# Patient Record
Sex: Female | Born: 1980 | Race: Black or African American | Hispanic: No | Marital: Married | Smoking: Never smoker
Health system: Southern US, Community
[De-identification: ages and names within clinical notes are randomized; demographics above are authoritative.]

## PROBLEM LIST (undated history)

## (undated) DIAGNOSIS — H538 Other visual disturbances: Secondary | ICD-10-CM

## (undated) DIAGNOSIS — N390 Urinary tract infection, site not specified: Secondary | ICD-10-CM

## (undated) DIAGNOSIS — G43909 Migraine, unspecified, not intractable, without status migrainosus: Secondary | ICD-10-CM

## (undated) HISTORY — DX: Other visual disturbances: H53.8

---

## 2004-09-18 HISTORY — PX: TUBAL LIGATION: SHX77

## 2005-09-18 HISTORY — PX: CHOLECYSTECTOMY: SHX55

## 2013-02-14 ENCOUNTER — Encounter (HOSPITAL_COMMUNITY): Payer: Self-pay | Admitting: Emergency Medicine

## 2013-02-14 ENCOUNTER — Emergency Department (HOSPITAL_COMMUNITY)
Admission: EM | Admit: 2013-02-14 | Discharge: 2013-02-14 | Disposition: A | Discharge status: Home / Self Care | Payer: Medicaid Other | Attending: Emergency Medicine | Admitting: Emergency Medicine

## 2013-02-14 DIAGNOSIS — R6889 Other general symptoms and signs: Secondary | ICD-10-CM | POA: Insufficient documentation

## 2013-02-14 DIAGNOSIS — H579 Unspecified disorder of eye and adnexa: Secondary | ICD-10-CM | POA: Insufficient documentation

## 2013-02-14 DIAGNOSIS — Z8679 Personal history of other diseases of the circulatory system: Secondary | ICD-10-CM | POA: Insufficient documentation

## 2013-02-14 DIAGNOSIS — J302 Other seasonal allergic rhinitis: Secondary | ICD-10-CM

## 2013-02-14 DIAGNOSIS — H5789 Other specified disorders of eye and adnexa: Secondary | ICD-10-CM | POA: Insufficient documentation

## 2013-02-14 DIAGNOSIS — J309 Allergic rhinitis, unspecified: Secondary | ICD-10-CM | POA: Insufficient documentation

## 2013-02-14 HISTORY — DX: Migraine, unspecified, not intractable, without status migrainosus: G43.909

## 2013-02-14 MED ORDER — LORATADINE 10 MG PO TABS: 10.0000 mg | ORAL_TABLET | Freq: Every day | ORAL | Status: DC

## 2013-02-14 NOTE — ED Provider Notes (Signed)
History  This chart was scribed for Arnoldo Hooker, PA-C working with Dione Booze, MD by Ardelia Mems, ED Scribe. This patient was seen in room TR11C/TR11C and the patient's care was started at 9:44 PM.   CSN: [id number]  Arrival date & time 02/14/13  2003     Chief Complaint  Patient presents with  . Sinus Problem     The history is provided by the patient. No language interpreter was used.    HPI Comments: Brittney Watkins is a 32 y.o. female who presents to the Emergency Department complaining of constant, moderate sinus pain of about 10 days duration. Patient also reports associated nasal congestion, rhinorrhea, itchy watery eyes and some sneezing. Pt denies fever. Pt has tried taken Benadryl with only mild relief.     Past Medical History  Diagnosis Date  . Migraine     Past Surgical History  Procedure Laterality Date  . Cholecystectomy    . Tubal ligation      No family history on file.  History  Substance Use Topics  . Smoking status: Never Smoker   . Smokeless tobacco: Not on file  . Alcohol Use: No    OB History   Grav Para Term Preterm Abortions TAB SAB Ect Mult Living                  Review of Systems  Constitutional: Negative for fever and chills.  HENT: Positive for congestion, rhinorrhea and sneezing.     Allergies  Nyquil multi-symptom  Home Medications   Current Outpatient Rx  Name  Route  Sig  Dispense  Refill  . DiphenhydrAMINE HCl (BENADRYL PO)   Oral   Take 1 tablet by mouth 2 (two) times daily as needed (seasonal allergies).           Triage Vitals: BP 116/79  Pulse 90  Temp(Src) 98.4 F (36.9 C) (Oral)  Resp 16  SpO2 97%  LMP 01/31/2013  Physical Exam  Nursing note and vitals reviewed. Constitutional: She is oriented to person, place, and time. She appears well-developed and well-nourished.  HENT:  Head: Normocephalic and atraumatic.  Nasal mucosa swollen and boggy. Oropharynx benign. TMs clear bilaterally. Sinus  tenderness frontal and maxillary sinuses bilaterally  Eyes: EOM are normal. Pupils are equal, round, and reactive to light.  Neck: Normal range of motion. No tracheal deviation present.  Cardiovascular: Normal rate.   Pulmonary/Chest: Effort normal. No respiratory distress.  Abdominal: Soft. There is no tenderness.  Musculoskeletal: Normal range of motion. She exhibits no tenderness.  Neurological: She is alert and oriented to person, place, and time.  Skin: Skin is warm. No rash noted.  Psychiatric: She has a normal mood and affect.    ED Course  Procedures (including critical care time)  DIAGNOSTIC STUDIES: Oxygen Saturation is 97% on RA, normal by my interpretation.    COORDINATION OF CARE: 9:52 PM- Pt advised of plan for treatment and pt agrees.     Labs Reviewed - No data to display No results found.   No diagnosis found.  1. Seasonal allergies  MDM  Uncomplicated allergic rhinitis         I personally performed the services described in this documentation, which was scribed in my presence. The recorded information has been reviewed and is accurate.     Arnoldo Hooker, PA-C 02/14/13 2219

## 2013-02-14 NOTE — ED Notes (Signed)
Pt reports sinus drainage x 1.5 weeks. States she has taken OTC meds with no relief. States sinus pressure and headache are 7/10. Pt denies fever, sore throat, cough.

## 2013-02-14 NOTE — ED Notes (Addendum)
C/o runny nose, congestion, and sinus pressure x 1 week.  Using Benadryl, Advil PM, and allergy/sinus medication with some relief of symptoms.  Denies cough

## 2013-02-15 NOTE — ED Provider Notes (Signed)
Medical screening examination/treatment/procedure(s) were performed by non-physician practitioner and as supervising physician I was immediately available for consultation/collaboration.  Ramina Hulet, MD 02/15/13 0035 

## 2013-04-07 ENCOUNTER — Encounter (HOSPITAL_COMMUNITY): Payer: Self-pay | Admitting: *Deleted

## 2013-04-07 ENCOUNTER — Emergency Department (HOSPITAL_COMMUNITY)
Admission: EM | Admit: 2013-04-07 | Discharge: 2013-04-07 | Disposition: A | Discharge status: Home / Self Care | Payer: Medicaid Other | Source: Home / Self Care

## 2013-04-07 DIAGNOSIS — R519 Headache, unspecified: Secondary | ICD-10-CM

## 2013-04-07 DIAGNOSIS — J320 Chronic maxillary sinusitis: Secondary | ICD-10-CM

## 2013-04-07 DIAGNOSIS — R51 Headache: Secondary | ICD-10-CM

## 2013-04-07 MED ORDER — SUMATRIPTAN SUCCINATE 25 MG PO TABS: ORAL_TABLET | ORAL | Status: DC

## 2013-04-07 NOTE — ED Provider Notes (Signed)
History    CSN: [id number] Arrival date & time 04/07/13  1306  None    Chief Complaint  Patient presents with  . Migraine   (Consider location/radiation/quality/duration/timing/severity/associated sxs/prior Treatment) HPI Comments: 32 year old female was at work today and developed a sinus headache. The pain is located in bilateral maxillary sinuses. Pain radiated to the right temple and periorbital area. She been to the emergency department to 3 weeks ago and was given Fioricet for pain. Has a history of migraine headaches since her early 51s and the frequency is highly variable. She was placed on Imitrex tablets of 2 years ago but she is out of his medications. [place]. She has no current physician but she does have Medicaid. She has a slight that Dr. Lockie Mola card and she is waiting until next month to have that physician changed. She states his headache is a minor when her pain is minimal she had temporary blurriness of vision but no photophobia or diplopia. She had transient nausea without vomiting that too has abated. Denies focal paresthesias or weakness.  Past Medical History  Diagnosis Date  . Migraine    Past Surgical History  Procedure Laterality Date  . Cholecystectomy    . Tubal ligation     History reviewed. No pertinent family history. History  Substance Use Topics  . Smoking status: Never Smoker   . Smokeless tobacco: Not on file  . Alcohol Use: No   OB History   Grav Para Term Preterm Abortions TAB SAB Ect Mult Living                 Review of Systems  Constitutional: Positive for activity change. Negative for fever and fatigue.  HENT: Positive for congestion and sinus pressure. Negative for ear pain, sore throat, facial swelling, neck pain and postnasal drip.   Eyes: Negative for photophobia and visual disturbance.  Respiratory: Negative.   Cardiovascular: Negative.   Gastrointestinal: Positive for nausea.   Genitourinary: Negative.   Neurological: Positive for headaches. Negative for dizziness, tremors, seizures, syncope, facial asymmetry, weakness, light-headedness and numbness.    Allergies  Nyquil multi-symptom  Home Medications   Current Outpatient Rx  Name  Route  Sig  Dispense  Refill  . Butalbital-APAP-Caffeine (FIORICET PO)   Oral   Take by mouth.         . Topiramate (TOPAMAX PO)   Oral   Take by mouth.         . DiphenhydrAMINE HCl (BENADRYL PO)   Oral   Take 1 tablet by mouth 2 (two) times daily as needed (seasonal allergies).         . loratadine (CLARITIN) 10 MG tablet   Oral   Take 1 tablet (10 mg total) by mouth daily.   14 tablet   0   . SUMAtriptan (IMITREX) 25 MG tablet      One tablet po at onset of headache and , may repeat x 1 in 2 h prn.   10 tablet   0    BP 115/67  Pulse 80  Temp(Src) 98.5 F (36.9 C)  Resp 17  SpO2 100%  LMP 04/03/2013 Physical Exam  Nursing note and vitals reviewed. Constitutional: She is oriented to person, place, and time. She appears well-developed and well-nourished. No distress.  HENT:  Head: Normocephalic and atraumatic.  Mouth/Throat: Oropharynx is clear and moist. No oropharyngeal exudate.  Eyes: Conjunctivae and EOM are normal. Pupils are  equal, round, and reactive to light.  Neck: Normal range of motion. Neck supple.  Cardiovascular: Normal rate and normal heart sounds.   Pulmonary/Chest: Effort normal and breath sounds normal. No respiratory distress. She has no wheezes. She has no rales.  Abdominal: Soft. There is no tenderness.  Musculoskeletal: Normal range of motion.  Lymphadenopathy:    She has no cervical adenopathy.  Neurological: She is alert and oriented to person, place, and time. She has normal strength. She displays no tremor. No cranial nerve deficit or sensory deficit. She exhibits normal muscle tone. Coordination normal. GCS eye subscore is 4. GCS verbal subscore is 5. GCS motor subscore  is 6.  Skin: Skin is warm and dry.  Psychiatric: She has a normal mood and affect.    ED Course  Procedures (including critical care time) Labs Reviewed - No data to display No results found. 1. Sinusitis, maxillary, chronic   2. Headache around the eyes     MDM  Imitrex 25mg  tablets, one at onset of headache and may repeat in 2 hours x1 when necessary. Followup with your primary care doctor is on her Medicaid card. Continue other medications as previously prescribed. Patient is discharged in a stable condition.   Hayden Rasmussen, NP 04/07/13 1420  Hayden Rasmussen, NP 04/07/13 2131

## 2013-04-07 NOTE — ED Notes (Signed)
Pt  Has  History  Of  Migraines     She  Is  Out of  Her imitrex  Which  She  Says  Typically  releives  The  Symptoms     She  Is  Requesting an rx  For the  imitrex    She  Is  Sitting upright on the  Exam table  Speaking in  Complete  sentances  And  Appears  In no  Severe  Distress

## 2013-04-10 NOTE — ED Provider Notes (Signed)
Medical screening examination/treatment/procedure(s) were performed by resident physician or non-physician practitioner and as supervising physician I was immediately available for consultation/collaboration.   Barkley Bruns MD.   Linna Hoff, MD 04/10/13 1745

## 2013-06-10 ENCOUNTER — Emergency Department (INDEPENDENT_AMBULATORY_CARE_PROVIDER_SITE_OTHER)
Admission: EM | Admit: 2013-06-10 | Discharge: 2013-06-10 | Disposition: A | Discharge status: Home / Self Care | Payer: Medicaid Other | Source: Home / Self Care | Attending: Emergency Medicine | Admitting: Emergency Medicine

## 2013-06-10 DIAGNOSIS — B009 Herpesviral infection, unspecified: Secondary | ICD-10-CM

## 2013-06-10 DIAGNOSIS — R300 Dysuria: Secondary | ICD-10-CM

## 2013-06-10 LAB — POCT URINALYSIS DIP (DEVICE)
Bilirubin Urine: NEGATIVE
Glucose, UA: NEGATIVE mg/dL
Leukocytes, UA: NEGATIVE
Nitrite: NEGATIVE
pH: 6.5 (ref 5.0–8.0)

## 2013-06-10 MED ORDER — FAMCICLOVIR 500 MG PO TABS: 500.0000 mg | ORAL_TABLET | Freq: Three times a day (TID) | ORAL | Status: DC

## 2013-06-10 MED ORDER — HYDROCODONE-ACETAMINOPHEN 5-325 MG PO TABS: 2.0000 | ORAL_TABLET | ORAL | Status: DC | PRN

## 2013-06-10 NOTE — ED Notes (Signed)
Pt c/o lower abd pain onset 2 weeks Sxs include: urinary freq/urgency Denies: dysuria, f/v/n/d Also c/o herpes flare up onset yest Alert w/no signs of acute distress.

## 2013-06-10 NOTE — ED Provider Notes (Signed)
Medical screening examination/treatment/procedure(s) were performed by non-physician practitioner and as supervising physician I was immediately available for consultation/collaboration.  Xian Alves, M.D.  Mouna Yager C Yuki Brunsman, MD 06/10/13 2100 

## 2013-06-10 NOTE — ED Provider Notes (Signed)
CSN: [id number]     Arrival date & time 06/10/13  1754 History   First MD Initiated Contact with Patient 06/10/13 1917     Chief Complaint  Patient presents with  . Abdominal Pain   (Consider location/radiation/quality/duration/timing/severity/associated sxs/prior Treatment) Patient is a 32 y.o. female presenting with abdominal pain. The history is provided by the patient. No language interpreter was used.  Abdominal Pain This is a new problem. Episode onset: 2 weeks. The problem occurs constantly. Associated symptoms include abdominal pain. Nothing aggravates the symptoms. Nothing relieves the symptoms. She has tried nothing for the symptoms.  Pt worried taht hse has a uti.  Pt reports lower abdominal soreness for 2 weeks,   Pt reports current herpes outbreak.   Pt has had herpes x 7 years.  No current std risk.    Past Medical History  Diagnosis Date  . Migraine    Past Surgical History  Procedure Laterality Date  . Cholecystectomy    . Tubal ligation     No family history on file. History  Substance Use Topics  . Smoking status: Never Smoker   . Smokeless tobacco: Not on file  . Alcohol Use: No   OB History   Grav Para Term Preterm Abortions TAB SAB Ect Mult Living                 Review of Systems  Gastrointestinal: Positive for abdominal pain.  All other systems reviewed and are negative.    Allergies  Nyquil multi-symptom  Home Medications   Current Outpatient Rx  Name  Route  Sig  Dispense  Refill  . SUMAtriptan (IMITREX) 25 MG tablet      One tablet po at onset of headache and , may repeat x 1 in 2 h prn.   10 tablet   0   . Topiramate (TOPAMAX PO)   Oral   Take by mouth.         Marland Kitchen Butalbital-APAP-Caffeine (FIORICET PO)   Oral   Take by mouth.         . DiphenhydrAMINE HCl (BENADRYL PO)   Oral   Take 1 tablet by mouth 2 (two) times daily as needed (seasonal allergies).         . famciclovir (FAMVIR) 500 MG tablet   Oral   Take 1 tablet  (500 mg total) by mouth 3 (three) times daily.   21 tablet   2   . HYDROcodone-acetaminophen (NORCO/VICODIN) 5-325 MG per tablet   Oral   Take 2 tablets by mouth every 4 (four) hours as needed for pain.   10 tablet   0   . loratadine (CLARITIN) 10 MG tablet   Oral   Take 1 tablet (10 mg total) by mouth daily.   14 tablet   0    BP 119/72  Pulse 68  Temp(Src) 97.9 F (36.6 C) (Oral)  Resp 16  SpO2 100% Physical Exam  Nursing note and vitals reviewed. Constitutional: She is oriented to person, place, and time. She appears well-developed and well-nourished.  HENT:  Head: Normocephalic.  Eyes: Pupils are equal, round, and reactive to light.  Neck: Normal range of motion.  Cardiovascular: Normal rate.   Pulmonary/Chest: Effort normal.  Abdominal: Soft. There is no tenderness. There is no rebound.  Genitourinary: Vagina normal and uterus normal. No vaginal discharge found.  Musculoskeletal: Normal range of motion.  Neurological: She is alert and oriented to person, place, and time. She has normal reflexes.  Skin:  Skin is warm.  Psychiatric: She has a normal mood and affect.    ED Course  Procedures (including critical care time) Labs Review Labs Reviewed  POCT URINALYSIS DIP (DEVICE)  POCT PREGNANCY, URINE   Imaging Review No results found.  MDM   1. Dysuria   2. Herpes    Rx for valtrex and hydrocodone   KELVIN BURPEE, PA-C 06/10/13 1936  Brittney Watkins [place], [place] 06/10/13 2022

## 2013-06-15 ENCOUNTER — Emergency Department (HOSPITAL_COMMUNITY)
Admission: EM | Admit: 2013-06-15 | Discharge: 2013-06-15 | Disposition: A | Discharge status: Home / Self Care | Payer: Medicaid Other | Source: Home / Self Care | Attending: Family Medicine | Admitting: Family Medicine

## 2013-06-15 ENCOUNTER — Encounter (HOSPITAL_COMMUNITY): Payer: Self-pay | Admitting: *Deleted

## 2013-06-15 DIAGNOSIS — R51 Headache: Secondary | ICD-10-CM

## 2013-06-15 DIAGNOSIS — G43909 Migraine, unspecified, not intractable, without status migrainosus: Secondary | ICD-10-CM

## 2013-06-15 DIAGNOSIS — R519 Headache, unspecified: Secondary | ICD-10-CM

## 2013-06-15 MED ORDER — METHYLPREDNISOLONE 4 MG PO KIT: PACK | ORAL | Status: DC

## 2013-06-15 MED ORDER — SUMATRIPTAN SUCCINATE 25 MG PO TABS: ORAL_TABLET | ORAL | Status: DC

## 2013-06-15 MED ORDER — BUTALBITAL-APAP-CAFFEINE 50-325-40 MG PO TABS: 2.0000 | ORAL_TABLET | Freq: Four times a day (QID) | ORAL | Status: DC | PRN

## 2013-06-15 MED ORDER — CETIRIZINE-PSEUDOEPHEDRINE ER 5-120 MG PO TB12: 1.0000 | ORAL_TABLET | Freq: Two times a day (BID) | ORAL | Status: DC | PRN

## 2013-06-15 NOTE — ED Provider Notes (Signed)
CSN: [id number]     Arrival date & time 06/15/13  0912 History   First MD Initiated Contact with Patient 06/15/13 [phone]     Chief Complaint  Patient presents with  . Migraine   (Consider location/radiation/quality/duration/timing/severity/associated sxs/prior Treatment) HPI Comments: 32 year old female presents complaining of migraine headache. She has a long history of migraines and this is exactly the same as all previous migraines she has ever had. Migraines located across the sinuses and in the right side of her head. This has been going on since yesterday. She can normally control this with Fioricet and with sumatriptan but she ran out of both of these. She does have a primary care appointment set up for next week. She admits to one episode of vomiting but this was after she tried to take some pain medicine that she got from here last week for her abdominal pain, there's been no other nausea after this single episode of vomiting and she believes this is from the pain medicine. In addition to migraine, she has been having sinus pressure. She has tried to treat this at home with Advil and with a Neti pot but this has not been helpful. She has associated nasal congestion and sinus tenderness around her nose.   Past Medical History  Diagnosis Date  . Migraine    Past Surgical History  Procedure Laterality Date  . Cholecystectomy    . Tubal ligation     No family history on file. History  Substance Use Topics  . Smoking status: Never Smoker   . Smokeless tobacco: Not on file  . Alcohol Use: No   OB History   Grav Para Term Preterm Abortions TAB SAB Ect Mult Living                 Review of Systems  Constitutional: Negative for fever and chills.  HENT: Positive for congestion and sinus pressure. Negative for ear pain and neck pain.        Phonophobia  Eyes: Positive for photophobia. Negative for visual disturbance.  Respiratory: Negative for cough and shortness of breath.    Cardiovascular: Negative for chest pain, palpitations and leg swelling.  Gastrointestinal: Positive for vomiting ( resolved). Negative for nausea and abdominal pain.  Endocrine: Negative for polydipsia and polyuria.  Genitourinary: Negative for dysuria, urgency and frequency.  Musculoskeletal: Negative for myalgias and arthralgias.  Skin: Negative for rash.  Neurological: Positive for headaches. Negative for dizziness, weakness and light-headedness.    Allergies  Nyquil multi-symptom and Shellfish allergy  Home Medications   Current Outpatient Rx  Name  Route  Sig  Dispense  Refill  . famciclovir (FAMVIR) 500 MG tablet   Oral   Take 1 tablet (500 mg total) by mouth 3 (three) times daily.   21 tablet   2   . butalbital-acetaminophen-caffeine (FIORICET) 50-325-40 MG per tablet   Oral   Take 2 tablets by mouth every 6 (six) hours as needed.   20 tablet   0   . cetirizine-pseudoephedrine (ZYRTEC-D) 5-120 MG per tablet   Oral   Take 1 tablet by mouth 2 (two) times daily as needed for allergies.   30 tablet   0   . DiphenhydrAMINE HCl (BENADRYL PO)   Oral   Take 1 tablet by mouth 2 (two) times daily as needed (seasonal allergies).         Marland Kitchen HYDROcodone-acetaminophen (NORCO/VICODIN) 5-325 MG per tablet   Oral   Take 2 tablets by mouth every 4 (  four) hours as needed for pain.   10 tablet   0   . loratadine (CLARITIN) 10 MG tablet   Oral   Take 1 tablet (10 mg total) by mouth daily.   14 tablet   0   . methylPREDNISolone (MEDROL DOSEPAK) 4 MG tablet      follow package directions   21 tablet   0     Dispense as written.   . SUMAtriptan (IMITREX) 25 MG tablet      One tablet po at onset of headache and , may repeat x 1 in 2 h prn.   10 tablet   0   . Topiramate (TOPAMAX PO)   Oral   Take by mouth.          BP 124/79  Pulse 71  Temp(Src) 99 F (37.2 C) (Oral)  Resp 18  SpO2 100%  LMP 05/29/2013 Physical Exam  Nursing note and vitals  reviewed. Constitutional: She is oriented to person, place, and time. Vital signs are normal. She appears well-developed and well-nourished. No distress.  HENT:  Head: Normocephalic and atraumatic.  Nose: Right sinus exhibits maxillary sinus tenderness. Right sinus exhibits no frontal sinus tenderness. Left sinus exhibits maxillary sinus tenderness. Left sinus exhibits no frontal sinus tenderness.  Ethmoid sinus tenderness  Cardiovascular: Normal rate, regular rhythm and normal heart sounds.   Pulmonary/Chest: Effort normal and breath sounds normal. No respiratory distress.  Neurological: She is alert and oriented to person, place, and time. She has normal strength. No cranial nerve deficit or sensory deficit. Coordination normal. GCS eye subscore is 4. GCS verbal subscore is 5. GCS motor subscore is 6.  Skin: Skin is warm and dry. No rash noted. She is not diaphoretic.  Psychiatric: She has a normal mood and affect. Judgment normal.    ED Course  Procedures (including critical care time) Labs Review Labs Reviewed - No data to display Imaging Review No results found.  MDM   1. Migraine headache   2. Sinus headache    Given the option of treating here to try to break migraine versus prescribing her the sumatriptan and Fioricet and discharge her, she elects to go with the latter. She will return to migraine does not abate. We'll also prescribe Zyrtec-D and Medrol Dosepak to help with the sinus pressure   Meds ordered this encounter  Medications  . SUMAtriptan (IMITREX) 25 MG tablet    Sig: One tablet po at onset of headache and , may repeat x 1 in 2 h prn.    Dispense:  10 tablet    Refill:  0    Order Specific Question:  Supervising Provider    Answer:  Linna Hoff [phone]  . butalbital-acetaminophen-caffeine (FIORICET) 50-325-40 MG per tablet    Sig: Take 2 tablets by mouth every 6 (six) hours as needed.    Dispense:  20 tablet    Refill:  0    Order Specific Question:   Supervising Provider    Answer:  Lorenz Coaster, DAVID C [id number]  . methylPREDNISolone (MEDROL DOSEPAK) 4 MG tablet    Sig: follow package directions    Dispense:  21 tablet    Refill:  0    Order Specific Question:  Supervising Provider    Answer:  Lorenz Coaster, DAVID C [id number]  . cetirizine-pseudoephedrine (ZYRTEC-D) 5-120 MG per tablet    Sig: Take 1 tablet by mouth 2 (two) times daily as needed for allergies.    Dispense:  30 tablet  Refill:  0    Order Specific Question:  Supervising Provider    Answer:  Lorenz Coaster, DAVID C [[id number]]       Graylon Good, PA-C 06/15/13 1024

## 2013-06-15 NOTE — ED Provider Notes (Signed)
Medical screening examination/treatment/procedure(s) were performed by a resident physician or non-physician practitioner and as the supervising physician I was immediately available for consultation/collaboration.  Evan Corey, MD   Evan S Corey, MD 06/15/13 1939 

## 2013-06-15 NOTE — ED Notes (Signed)
Onset yesterday migraine headache with sinus congestion without fever

## 2013-11-26 ENCOUNTER — Encounter (HOSPITAL_COMMUNITY): Payer: Self-pay | Admitting: Emergency Medicine

## 2013-11-26 ENCOUNTER — Emergency Department (INDEPENDENT_AMBULATORY_CARE_PROVIDER_SITE_OTHER)
Admission: EM | Admit: 2013-11-26 | Discharge: 2013-11-26 | Disposition: A | Discharge status: Home / Self Care | Payer: Medicaid Other | Source: Home / Self Care | Attending: Family Medicine | Admitting: Family Medicine

## 2013-11-26 DIAGNOSIS — G43909 Migraine, unspecified, not intractable, without status migrainosus: Secondary | ICD-10-CM

## 2013-11-26 MED ORDER — DEXAMETHASONE SODIUM PHOSPHATE 10 MG/ML IJ SOLN
INTRAMUSCULAR | Status: AC
  Filled 2013-11-26: qty 1

## 2013-11-26 MED ORDER — METOCLOPRAMIDE HCL 5 MG/ML IJ SOLN
10.0000 mg | Freq: Once | INTRAMUSCULAR | Status: AC
  Administered 2013-11-26: 10 mg via INTRAMUSCULAR

## 2013-11-26 MED ORDER — DIPHENHYDRAMINE HCL 50 MG/ML IJ SOLN
INTRAMUSCULAR | Status: AC
  Filled 2013-11-26: qty 1

## 2013-11-26 MED ORDER — DEXAMETHASONE SODIUM PHOSPHATE 10 MG/ML IJ SOLN
10.0000 mg | Freq: Once | INTRAMUSCULAR | Status: AC
  Administered 2013-11-26: 10 mg via INTRAMUSCULAR

## 2013-11-26 MED ORDER — DIPHENHYDRAMINE HCL 50 MG/ML IJ SOLN
12.5000 mg | Freq: Once | INTRAMUSCULAR | Status: AC
  Administered 2013-11-26: 12.5 mg via INTRAMUSCULAR

## 2013-11-26 MED ORDER — METOCLOPRAMIDE HCL 5 MG/ML IJ SOLN: 5.0000 mg | Freq: Once | INTRAMUSCULAR | Status: DC

## 2013-11-26 MED ORDER — KETOROLAC TROMETHAMINE 60 MG/2ML IM SOLN
INTRAMUSCULAR | Status: AC
  Filled 2013-11-26: qty 2

## 2013-11-26 MED ORDER — KETOROLAC TROMETHAMINE 60 MG/2ML IM SOLN
60.0000 mg | Freq: Once | INTRAMUSCULAR | Status: AC
  Administered 2013-11-26: 60 mg via INTRAMUSCULAR

## 2013-11-26 MED ORDER — METOCLOPRAMIDE HCL 5 MG/ML IJ SOLN
INTRAMUSCULAR | Status: AC
  Filled 2013-11-26: qty 2

## 2013-11-26 NOTE — Discharge Instructions (Signed)
Migraine Headache A migraine headache is an intense, throbbing pain on one or both sides of your head. A migraine can last for 30 minutes to several hours. CAUSES  The exact cause of a migraine headache is not always known. However, a migraine may be caused when nerves in the brain become irritated and release chemicals that cause inflammation. This causes pain. Certain things may also trigger migraines, such as:  Alcohol.  Smoking.  Stress.  Menstruation.  Aged cheeses.  Foods or drinks that contain nitrates, glutamate, aspartame, or tyramine.  Lack of sleep.  Chocolate.  Caffeine.  Hunger.  Physical exertion.  Fatigue.  Medicines used to treat chest pain (nitroglycerine), birth control pills, estrogen, and some blood pressure medicines. SIGNS AND SYMPTOMS  Pain on one or both sides of your head.  Pulsating or throbbing pain.  Severe pain that prevents daily activities.  Pain that is aggravated by any physical activity.  Nausea, vomiting, or both.  Dizziness.  Pain with exposure to bright lights, loud noises, or activity.  General sensitivity to bright lights, loud noises, or smells. Before you get a migraine, you may get warning signs that a migraine is coming (aura). An aura may include:  Seeing flashing lights.  Seeing bright spots, halos, or zig-zag lines.  Having tunnel vision or blurred vision.  Having feelings of numbness or tingling.  Having trouble talking.  Having muscle weakness. DIAGNOSIS  A migraine headache is often diagnosed based on:  Symptoms.  Physical exam.  A CT scan or MRI of your head. These imaging tests cannot diagnose migraines, but they can help rule out other causes of headaches. TREATMENT Medicines may be given for pain and nausea. Medicines can also be given to help prevent recurrent migraines.  HOME CARE INSTRUCTIONS  Only take over-the-counter or prescription medicines for pain or discomfort as directed by your  health care provider. The use of long-term narcotics is not recommended.  Lie down in a dark, quiet room when you have a migraine.  Keep a journal to find out what may trigger your migraine headaches. For example, write down:  What you eat and drink.  How much sleep you get.  Any change to your diet or medicines.  Limit alcohol consumption.  Quit smoking if you smoke.  Get 7 9 hours of sleep, or as recommended by your health care provider.  Limit stress.  Keep lights dim if bright lights bother you and make your migraines worse. SEEK IMMEDIATE MEDICAL CARE IF:   Your migraine becomes severe.  You have a fever.  You have a stiff neck.  You have vision loss.  You have muscular weakness or loss of muscle control.  You start losing your balance or have trouble walking.  You feel faint or pass out.  You have severe symptoms that are different from your first symptoms. MAKE SURE YOU:   Understand these instructions.  Will watch your condition.  Will get help right away if you are not doing well or get worse. Document Released: 09/04/2005 Document Revised: 06/25/2013 Document Reviewed: 05/12/2013 [employer] Patient Information 2014 [employer].  

## 2013-11-26 NOTE — ED Provider Notes (Signed)
CSN: [id number]     Arrival date & time 11/26/13  0847 History   First MD Initiated Contact with Patient 11/26/13 [phone]     Chief Complaint  Patient presents with  . Migraine   (Consider location/radiation/quality/duration/timing/severity/associated sxs/prior Treatment) HPI Comments: 33 year old female with a history of migraines presents complaining of Migraine since yesterday.  This is Exactly the same as previous migraines, the only difference is this is not getting better with imitrex.  She has taken 3 doses of Imitrex, most recently 3 hours ago. She has HA in right side of her head with nausea, vomiting, dizziness, photophobia.  Pain currently 9/10 . She denies any injury. She denies any differences between this and previous migraines. She does not take any blood thinners.  Patient is a 33 y.o. female presenting with migraines.  Migraine Associated symptoms include headaches. Pertinent negatives include no chest pain, no abdominal pain and no shortness of breath.    Past Medical History  Diagnosis Date  . Migraine    Past Surgical History  Procedure Laterality Date  . Cholecystectomy    . Tubal ligation     No family history on file. History  Substance Use Topics  . Smoking status: Never Smoker   . Smokeless tobacco: Not on file  . Alcohol Use: No   OB History   Grav Para Term Preterm Abortions TAB SAB Ect Mult Living                 Review of Systems  Constitutional: Negative for fever and chills.  Eyes: Positive for photophobia and pain. Negative for visual disturbance.  Respiratory: Negative for cough and shortness of breath.   Cardiovascular: Negative for chest pain, palpitations and leg swelling.  Gastrointestinal: Positive for nausea. Negative for vomiting and abdominal pain.  Endocrine: Negative for polydipsia and polyuria.  Genitourinary: Negative for dysuria, urgency and frequency.  Musculoskeletal: Negative for arthralgias and myalgias.  Skin: Negative for  rash.  Neurological: Positive for dizziness and headaches. Negative for weakness and light-headedness.    Allergies  Nyquil multi-symptom and Shellfish allergy  Home Medications   Current Outpatient Rx  Name  Route  Sig  Dispense  Refill  . SUMAtriptan (IMITREX) 25 MG tablet      One tablet po at onset of headache and , may repeat x 1 in 2 h prn.   10 tablet   0   . Topiramate (TOPAMAX PO)   Oral   Take by mouth.         . butalbital-acetaminophen-caffeine (FIORICET) 50-325-40 MG per tablet   Oral   Take 2 tablets by mouth every 6 (six) hours as needed.   20 tablet   0   . cetirizine-pseudoephedrine (ZYRTEC-D) 5-120 MG per tablet   Oral   Take 1 tablet by mouth 2 (two) times daily as needed for allergies.   30 tablet   0   . DiphenhydrAMINE HCl (BENADRYL PO)   Oral   Take 1 tablet by mouth 2 (two) times daily as needed (seasonal allergies).         . famciclovir (FAMVIR) 500 MG tablet   Oral   Take 1 tablet (500 mg total) by mouth 3 (three) times daily.   21 tablet   2   . HYDROcodone-acetaminophen (NORCO/VICODIN) 5-325 MG per tablet   Oral   Take 2 tablets by mouth every 4 (four) hours as needed for pain.   10 tablet   0   . loratadine (CLARITIN)  10 MG tablet   Oral   Take 1 tablet (10 mg total) by mouth daily.   14 tablet   0   . methylPREDNISolone (MEDROL DOSEPAK) 4 MG tablet      follow package directions   21 tablet   0     Dispense as written.    BP 117/72  Pulse 62  Temp(Src) 98 F (36.7 C) (Oral)  Resp 16  SpO2 100%  LMP 11/24/2013 Physical Exam  Nursing note and vitals reviewed. Constitutional: She is oriented to person, place, and time. Vital signs are normal. She appears well-developed and well-nourished. She appears distressed.  Uncomfortable appearing, sitting in the dark, squinting and avoiding light   HENT:  Head: Normocephalic and atraumatic.  Eyes: Conjunctivae and EOM are normal. Pupils are equal, round, and reactive  to light. No scleral icterus.  Neck: Normal range of motion. Neck supple.  Pulmonary/Chest: Effort normal. No respiratory distress.  Lymphadenopathy:       Head (right side): No submandibular and no tonsillar adenopathy present.       Head (left side): No submandibular and no tonsillar adenopathy present.    She has no cervical adenopathy.  Neurological: She is alert and oriented to person, place, and time. She has normal strength. No cranial nerve deficit or sensory deficit. She exhibits normal muscle tone. Coordination and gait normal. GCS eye subscore is 4. GCS verbal subscore is 5. GCS motor subscore is 6.  Skin: Skin is warm and dry. No rash noted. She is not diaphoretic.  Psychiatric: She has a normal mood and affect. Judgment normal.    ED Course  Procedures (including critical care time) Labs Review Labs Reviewed - No data to display Imaging Review No results found.  944: giving toradol, reglan, diphenhydramine, dexamethasone  MDM   1. Migraine    One hour after injections, headache completely resolved. Patient is stable for discharge, followup when necessary or if symptoms return   Meds ordered this encounter  Medications  . ketorolac (TORADOL) injection 60 mg    Sig:   . DISCONTD: metoCLOPramide (REGLAN) injection 5 mg    Sig:   . dexamethasone (DECADRON) injection 10 mg    Sig:   . diphenhydrAMINE (BENADRYL) injection 12.5 mg    Sig:   . metoCLOPramide (REGLAN) injection 10 mg    Sig:      Graylon GoodZachary H Spencer Cardinal, PA-C 11/26/13 2224

## 2013-11-26 NOTE — ED Notes (Signed)
C/o  Migraine headache since last night.  Nausea.  Dizziness.  Mild relief with imitrex.  Hx of migraines.

## 2013-11-27 NOTE — ED Provider Notes (Signed)
Medical screening examination/treatment/procedure(s) were performed by a resident physician or non-physician practitioner and as the supervising physician I was immediately available for consultation/collaboration.  Evan Corey, MD    Evan S Corey, MD 11/27/13 0810 

## 2014-02-11 ENCOUNTER — Emergency Department (HOSPITAL_COMMUNITY)
Admission: EM | Admit: 2014-02-11 | Discharge: 2014-02-11 | Disposition: A | Discharge status: Home / Self Care | Payer: Medicaid Other | Attending: Emergency Medicine | Admitting: Emergency Medicine

## 2014-02-11 ENCOUNTER — Encounter (HOSPITAL_COMMUNITY): Payer: Self-pay | Admitting: Emergency Medicine

## 2014-02-11 DIAGNOSIS — Y939 Activity, unspecified: Secondary | ICD-10-CM | POA: Insufficient documentation

## 2014-02-11 DIAGNOSIS — Z79899 Other long term (current) drug therapy: Secondary | ICD-10-CM | POA: Insufficient documentation

## 2014-02-11 DIAGNOSIS — W261XXA Contact with sword or dagger, initial encounter: Secondary | ICD-10-CM

## 2014-02-11 DIAGNOSIS — Y929 Unspecified place or not applicable: Secondary | ICD-10-CM | POA: Insufficient documentation

## 2014-02-11 DIAGNOSIS — IMO0001 Reserved for inherently not codable concepts without codable children: Secondary | ICD-10-CM

## 2014-02-11 DIAGNOSIS — G43909 Migraine, unspecified, not intractable, without status migrainosus: Secondary | ICD-10-CM | POA: Insufficient documentation

## 2014-02-11 DIAGNOSIS — W260XXA Contact with knife, initial encounter: Secondary | ICD-10-CM | POA: Insufficient documentation

## 2014-02-11 DIAGNOSIS — S61209A Unspecified open wound of unspecified finger without damage to nail, initial encounter: Secondary | ICD-10-CM | POA: Insufficient documentation

## 2014-02-11 NOTE — ED Provider Notes (Signed)
Medical screening examination/treatment/procedure(s) were performed by non-physician practitioner and as supervising physician I was immediately available for consultation/collaboration.   EKG Interpretation None        Layla Maw Tashi Band, DO 02/11/14 1651

## 2014-02-11 NOTE — ED Notes (Signed)
Pt sts she was cutting open a bag of chicken when she accidentally hit her ring finger on her left hand. sts she wasn't able to get the blood to stop. Bleeding now under control. Small laceration seen on knuckle. Nad, skin warm and dry, resp e/u.

## 2014-02-11 NOTE — ED Provider Notes (Signed)
CSN: [id number]     Arrival date & time 02/11/14  1441 History  This chart was scribed for non-physician practitioner Emilia Beck, PA-C working with Dagmar Hait, MD by Dorothey Baseman, ED Scribe. This patient was seen in room TR10C/TR10C and the patient's care was started at 3:20 PM.     Chief Complaint  Patient presents with  . Laceration   Patient is a 33 y.o. female presenting with skin laceration. The history is provided by the patient. No language interpreter was used.  Laceration Location:  Finger Finger laceration location:  L ring finger Bleeding: controlled   Laceration mechanism:  Knife Pain details:    Severity:  Mild   Timing:  Constant   Progression:  Unchanged Relieved by:  Pressure  HPI Comments: Brittney Watkins is a 33 y.o. female who presents to the Emergency Department complaining of a laceration to the left, ring finger that she sustained earlier today on a kitchen knife. She reports a large amount of bleeding initially, but she applied pressure to the area with a clean towel and the bleeding is well-controlled at this time. She reports an associated, constant pain to the area around the wound. Patient has no other pertinent medical history.   Past Medical History  Diagnosis Date  . Migraine    Past Surgical History  Procedure Laterality Date  . Cholecystectomy    . Tubal ligation     No family history on file. History  Substance Use Topics  . Smoking status: Never Smoker   . Smokeless tobacco: Not on file  . Alcohol Use: No   OB History   Grav Para Term Preterm Abortions TAB SAB Ect Mult Living                 Review of Systems  Skin: Positive for wound.  All other systems reviewed and are negative.     Allergies  Nyquil multi-symptom and Shellfish allergy  Home Medications   Prior to Admission medications   Medication Sig Start Date End Date Taking? Authorizing Provider  butalbital-acetaminophen-caffeine (FIORICET) 50-325-40 MG  per tablet Take 2 tablets by mouth every 6 (six) hours as needed. 06/15/13   Adrian Blackwater Baker, PA-C  cetirizine-pseudoephedrine (ZYRTEC-D) 5-120 MG per tablet Take 1 tablet by mouth 2 (two) times daily as needed for allergies. 06/15/13   Graylon Good, PA-C  DiphenhydrAMINE HCl (BENADRYL PO) Take 1 tablet by mouth 2 (two) times daily as needed (seasonal allergies).    Historical Provider, MD  famciclovir (FAMVIR) 500 MG tablet Take 1 tablet (500 mg total) by mouth 3 (three) times daily. 06/10/13   Elson Areas, PA-C  HYDROcodone-acetaminophen (NORCO/VICODIN) 5-325 MG per tablet Take 2 tablets by mouth every 4 (four) hours as needed for pain. 06/10/13   Elson Areas, PA-C  loratadine (CLARITIN) 10 MG tablet Take 1 tablet (10 mg total) by mouth daily. 02/14/13   Arnoldo Hooker, PA-C  methylPREDNISolone (MEDROL DOSEPAK) 4 MG tablet follow package directions 06/15/13   Graylon Good, PA-C  SUMAtriptan (IMITREX) 25 MG tablet One tablet po at onset of headache and , may repeat x 1 in 2 h prn. 06/15/13   Graylon Good, PA-C  Topiramate (TOPAMAX PO) Take by mouth.    Historical Provider, MD   Triage Vitals: BP 132/89  Pulse 79  Temp(Src) 99.2 F (37.3 C) (Oral)  Resp 18  SpO2 100%  LMP 01/23/2014  Physical Exam  Nursing note and vitals reviewed. Constitutional:  She is oriented to person, place, and time. She appears well-developed and well-nourished. No distress.  HENT:  Head: Normocephalic and atraumatic.  Eyes: Conjunctivae are normal.  Neck: Normal range of motion. Neck supple.  Pulmonary/Chest: Effort normal. No respiratory distress.  Abdominal: She exhibits no distension.  Musculoskeletal: Normal range of motion.  Neurological: She is alert and oriented to person, place, and time.  Normal strength and sensation of left fingers.   Skin: Skin is warm and dry.  1 cm laceration over the dorsal left PIP. Bleeding controlled.   Psychiatric: She has a normal mood and affect. Her behavior is  normal.    ED Course  Procedures (including critical care time)  DIAGNOSTIC STUDIES: Oxygen Saturation is 100% on room air, normal by my interpretation.    COORDINATION OF CARE: 3:22 PM- Will perform wound care and repair the laceration with Derma Bond. Discussed treatment plan with patient at bedside and patient verbalized agreement.   LACERATION REPAIR Performed by: Emilia BeckKaitlyn Marshia Tropea Authorized by: Emilia BeckKaitlyn Analucia Hush Consent: Verbal consent obtained. Risks and benefits: risks, benefits and alternatives were discussed Consent given by: patient Patient identity confirmed: provided demographic data Prepped and Draped in normal sterile fashion Wound explored  Laceration Location: dorsal left fourth finger  Laceration Length: 1 cm  No Foreign Bodies seen or palpated  Anesthesia: none  Irrigation method: syringe Amount of cleaning: standard  Skin closure: dermabond  Number of sutures: n/a  Technique: n/a  Patient tolerance: Patient tolerated the procedure well with no immediate complications.   Labs Review Labs Reviewed - No data to display  Imaging Review No results found.   EKG Interpretation None      MDM   Final diagnoses:  Laceration of fourth finger, left    Patient's wound cleaned and closed with dermabond. Vitals stable and patient afebrile. No neurovascular compromise.   I personally performed the services described in this documentation, which was scribed in my presence. The recorded information has been reviewed and is accurate.     Emilia BeckKaitlyn Leilany Digeronimo, [place] JerseyPA-C 02/11/14 [phone]

## 2014-02-11 NOTE — Discharge Instructions (Signed)
Keep wound area clean. Do not pick at the dermabond. It will dissolve as the wound heals.

## 2014-09-08 ENCOUNTER — Encounter: Payer: Self-pay | Admitting: Neurology

## 2014-09-08 ENCOUNTER — Ambulatory Visit (INDEPENDENT_AMBULATORY_CARE_PROVIDER_SITE_OTHER): Payer: Medicaid Other | Admitting: Neurology

## 2014-09-08 VITALS — BP 101/68 | HR 73 | Ht 64.0 in | Wt 138.0 lb

## 2014-09-08 DIAGNOSIS — G43709 Chronic migraine without aura, not intractable, without status migrainosus: Secondary | ICD-10-CM

## 2014-09-08 DIAGNOSIS — R5382 Chronic fatigue, unspecified: Secondary | ICD-10-CM

## 2014-09-08 DIAGNOSIS — R519 Headache, unspecified: Secondary | ICD-10-CM | POA: Insufficient documentation

## 2014-09-08 DIAGNOSIS — R51 Headache: Secondary | ICD-10-CM

## 2014-09-08 DIAGNOSIS — M542 Cervicalgia: Secondary | ICD-10-CM | POA: Insufficient documentation

## 2014-09-08 DIAGNOSIS — G43909 Migraine, unspecified, not intractable, without status migrainosus: Secondary | ICD-10-CM | POA: Insufficient documentation

## 2014-09-08 DIAGNOSIS — G4452 New daily persistent headache (NDPH): Secondary | ICD-10-CM

## 2014-09-08 MED ORDER — TOPIRAMATE 25 MG PO TABS: 50.0000 mg | ORAL_TABLET | Freq: Two times a day (BID) | ORAL | Status: DC

## 2014-09-08 MED ORDER — SUMATRIPTAN 20 MG/ACT NA SOLN: 20.0000 mg | NASAL | Status: DC | PRN

## 2014-09-08 NOTE — Patient Instructions (Addendum)
Overall you are doing fairly well but I do want to suggest a few things today:   Remember to drink plenty of fluid, eat healthy meals and do not skip any meals. Try to eat protein with a every meal and eat a healthy snack such as fruit or nuts in between meals. Try to keep a regular sleep-wake schedule and try to exercise daily, particularly in the form of walking, 20-30 minutes a day, if you can.   As far as your medications are concerned, I would like to suggest: Increase the Topamax to 2 pills at night (50mg ) and one pill in the morning (25mg ). Can increse to 50mg  twice daily in 2 weeks as tolerated or needed.   As far as diagnostic testing: MRI of the brain  I would like to see you back in 3 months, sooner if we need to. Please call us with any interim questions, concerns, problems, updates or refill requests.   Please also call us for any test results so we can go over those with you on the phone.  My clinical assistant and will answer any of your questions and relay your messages to me and also relay most of my messages to you.   Our phone number is [phone]. We also have an after hours call service for urgent matters and there is a physician on-call for urgent questions. For any emergencies you know to call 911 or go to the nearest emergency room

## 2014-09-08 NOTE — Progress Notes (Signed)
[facility]    Provider:  Dr Lucia GaskinsAhern Referring Provider: [name], George, MD Primary Care Physician:  PROVIDER NOT IN SYSTEM  CC:  headaches  HPI:  Brittney Watkins is a 33 y.o. female here as a referral from Dr. Julio Sickssei-Bonsu for headaches. pmhx elevated hgba1c, vit d deficiency, migraines, hld.  Patient has had headaches ofr 8-9 years. Started with birth control patch. Recently gtting worse. Haing pain behind the eyes. Vision changes, blurry. Migraines are on the right side. They are throbbing. Pressure behind the eye. Sometimes sharp. +light sensitivity, +sound sensitivity, +nausea. Fatigued more recently. In the last month she has had 10 headaches. If she can catch them with imitrex they last an hour but if she can't catch them in time it lasts until she gets to the ED and gets a migraine cocktail. Can last 24 hours. 10/10 pain. Imitrex works. Tried relpax which caused her to vomit. Started Topamax 25mg  twice a day. She doesn't take the topamax every day like she is supposed to. Migraines getting more frequent and more severe. No aura. Unknown triggers. No focal neurologic symptoms with the migraines.   Reviewed notes, labs and imaging from outside physicians, which showed: cbc/bmp unremarkable, tsh wnl, hgba1c 5.8  Review of Systems: Patient complains of symptoms per HPI as well as the following symptoms: headache, blurred vision, eye pain, moles, decreased energy. Pertinent negatives per HPI. All others negative.   History   Social History  . Marital Status: Single    Spouse Name: N/A    Number of Children: 2  . Years of Education: 12+   Occupational History  . Not on file.   Social History Main Topics  . Smoking status: Never Smoker   . Smokeless tobacco: Never Used  . Alcohol Use: No  . Drug Use: No  . Sexual Activity: Yes    Birth Control/ Protection: Surgical   Other Topics Concern  . Not on file   Social History Narrative   Patient lives  at home with children and their father  Verlin DikeSherrell Lindvall    Patient has 2 children    Patient is a Archivistcollege student    [place] [employer] [place] [place]    Patient is right handed        Family History  Problem Relation Age of Onset  . Diabetes Maternal Grandmother     some uncles as well   . High blood pressure Maternal Grandmother     some uncles as well  . Heart attack Father     Past Medical History  Diagnosis Date  . Migraine     Past Surgical History  Procedure Laterality Date  . Cholecystectomy    . Tubal ligation      Current Outpatient Prescriptions  Medication Sig Dispense Refill  . butalbital-acetaminophen-caffeine (FIORICET WITH CODEINE) 50-325-40-30 MG per capsule Take 1 capsule by mouth every 4 (four) hours as needed for headache.    . Cyanocobalamin (B-12 PO) Take 1 tablet by mouth daily.    . ferrous sulfate 325 (65 FE) MG tablet Take 325 mg by mouth daily with breakfast.    . Multiple Vitamin (MULTIVITAMIN WITH MINERALS) TABS tablet Take 1 tablet by mouth daily.    . SUMAtriptan (IMITREX) 25 MG tablet Take 25 mg by mouth every 2 (two) hours as needed for migraine or headache. May repeat in 2 hours if headache persists or recurs.    . SUMAtriptan (IMITREX) 20 MG/ACT nasal spray Place 1 spray (  20 mg total) into the nose every 2 (two) hours as needed for migraine or headache. May repeat in 2 hours if headache persists or recurs. Do not use more than 2x a day and 2-3 times a week. 1 Inhaler 6  . topiramate (TOPAMAX) 25 MG tablet Take 2 tablets (50 mg total) by mouth 2 (two) times daily. 120 tablet 3   No current facility-administered medications for this visit.    Allergies as of 09/08/2014 - Review Complete 09/08/2014  Allergen Reaction Noted  . Nyquil multi-symptom [pseudoeph-doxylamine-dm-apap] Swelling and Rash 02/14/2013  . Shellfish allergy Rash 06/15/2013    Vitals: BP 101/68 mmHg  Pulse 73  Ht 5\' 4"  (1.626 m)  Wt 138 lb (62.596 kg)  BMI 23.68  kg/m2 Last Weight:  Wt Readings from Last 1 Encounters:  09/08/14 138 lb (62.596 kg)   Last Height:   Ht Readings from Last 1 Encounters:  09/08/14 5\' 4"  (1.626 m)    Physical exam: Exam: Gen: NAD, conversant, well nourised, well groomed                     CV: RRR, no MRG. No Carotid Bruits. No peripheral edema, warm, nontender Eyes: Conjunctivae clear without exudates or hemorrhage  Neuro: Detailed Neurologic Exam  Speech:    Speech is normal; fluent and spontaneous with normal comprehension.  Cognition:    The patient is oriented to person, place, and time;     recent and remote memory intact;     language fluent;     normal attention, concentration,     fund of knowledge Cranial Nerves:    The pupils are equal, round, and reactive to light. The fundi are normal and spontaneous venous pulsations are present. Visual fields are full to finger confrontation. Extraocular movements are intact. Trigeminal sensation is intact and the muscles of mastication are normal. The face is symmetric. The palate elevates in the midline. Voice is normal. Shoulder shrug is normal. The tongue has normal motion without fasciculations.   Coordination:    Normal finger to nose and heel to shin. Normal rapid alternating movements.   Gait:    Heel-toe and tandem gait are normal.   Motor Observation:    No asymmetry, no atrophy, and no involuntary movements noted. Tone:    Normal muscle tone.    Posture:    Posture is normal. normal erect    Strength:    Strength is V/V in the upper and lower limbs.      Sensation: intact     Reflex Exam:  DTR's:    Deep tendon reflexes in the upper and lower extremities are normal bilaterally.   Toes:    The toes are downgoing bilaterally.   Clonus:    Clonus is absent.      Assessment/Plan:  33 year old female with chronic migraines. Takes Topamax and imitrex. Takes fioricet.Neuro exam non focal.   - Limit fioricet to avoid rebound  headaches - Will try imitrex nasal spray at onset of headache - MRi of the brain for worsening headaches (she has never had imaging) - Increase Topamax, discussed teratogenic risks so use birth control - f/u 3 months To prevent or relieve headaches, try the following: Cool Compress. Lie down and place a cool compress on your head.  Avoid headache triggers. If certain foods or odors seem to have triggered your migraines in the past, avoid them. A headache diary might help you identify triggers.  Include physical activity  in your daily routine. Try a daily walk or other moderate aerobic exercise.  Manage stress. Find healthy ways to cope with the stressors, such as delegating tasks on your to-do list.  Practice relaxation techniques. Try deep breathing, yoga, massage and visualization.  Eat regularly. Eating regularly scheduled meals and maintaining a healthy diet might help prevent headaches. Also, drink plenty of fluids.  Follow a regular sleep schedule. Sleep deprivation might contribute to headaches Consider biofeedback. With this mind-body technique, you learn to control certain bodily functions - such as muscle tension, heart rate and blood pressure - to prevent headaches or reduce headache pain.    Proceed to emergency room if you experience new or worsening symptoms or symptoms do not resolve, if you have new neurologic symptoms or if headache is severe, or for any concerning symptom.    Naomie DeanAntonia Avey Mcmanamon, MD  [facility] [address] [place], [place] [id number]  Phone [phone] Fax [phone]

## 2014-09-09 ENCOUNTER — Encounter: Payer: Self-pay | Admitting: Neurology

## 2014-09-15 ENCOUNTER — Encounter (INDEPENDENT_AMBULATORY_CARE_PROVIDER_SITE_OTHER): Payer: Medicaid Other | Admitting: Diagnostic Neuroimaging

## 2014-09-15 ENCOUNTER — Ambulatory Visit
Admission: RE | Admit: 2014-09-15 | Discharge: 2014-09-15 | Disposition: A | Discharge status: Home / Self Care | Payer: Medicaid Other | Source: Ambulatory Visit | Attending: Neurology | Admitting: Neurology

## 2014-09-15 DIAGNOSIS — G4452 New daily persistent headache (NDPH): Secondary | ICD-10-CM

## 2014-09-15 DIAGNOSIS — G43709 Chronic migraine without aura, not intractable, without status migrainosus: Secondary | ICD-10-CM

## 2014-09-17 ENCOUNTER — Telehealth: Payer: Self-pay | Admitting: Neurology

## 2014-09-17 NOTE — Telephone Encounter (Signed)
Personally reviewed MRI of the brain and discussed with patient. MRi of the brain within normal limits. Few scattered foci of white matter gliosis is non specific and frequently seen in patients with migraines.

## 2014-10-04 ENCOUNTER — Emergency Department (HOSPITAL_COMMUNITY)
Admission: EM | Admit: 2014-10-04 | Discharge: 2014-10-04 | Disposition: A | Discharge status: Home / Self Care | Payer: Medicaid Other | Source: Home / Self Care | Attending: Emergency Medicine | Admitting: Emergency Medicine

## 2014-10-04 ENCOUNTER — Other Ambulatory Visit (HOSPITAL_COMMUNITY)
Admission: RE | Admit: 2014-10-04 | Discharge: 2014-10-04 | Disposition: A | Discharge status: Home / Self Care | Payer: Medicaid Other | Source: Ambulatory Visit | Attending: Emergency Medicine | Admitting: Emergency Medicine

## 2014-10-04 ENCOUNTER — Encounter (HOSPITAL_COMMUNITY): Payer: Self-pay | Admitting: *Deleted

## 2014-10-04 DIAGNOSIS — Z113 Encounter for screening for infections with a predominantly sexual mode of transmission: Secondary | ICD-10-CM | POA: Insufficient documentation

## 2014-10-04 DIAGNOSIS — N760 Acute vaginitis: Secondary | ICD-10-CM | POA: Diagnosis present

## 2014-10-04 DIAGNOSIS — A6000 Herpesviral infection of urogenital system, unspecified: Secondary | ICD-10-CM | POA: Diagnosis not present

## 2014-10-04 MED ORDER — VALACYCLOVIR HCL 500 MG PO TABS: 500.0000 mg | ORAL_TABLET | Freq: Two times a day (BID) | ORAL | Status: DC

## 2014-10-04 NOTE — Discharge Instructions (Signed)
It looks like you are having a herpes outbreak. Take Valtrex 1 pill twice a day for 3 days.  I have provided extra pills for any future outbreaks. If any of your testing comes back positive, we will call you. Follow up as needed.

## 2014-10-04 NOTE — ED Provider Notes (Signed)
CSN: [id number]     Arrival date & time 10/04/14  1543 History   First MD Initiated Contact with Patient 10/04/14 1553     Chief Complaint  Patient presents with  . Vaginitis   (Consider location/radiation/quality/duration/timing/severity/associated sxs/prior Treatment) HPI  She is a 34 year old woman here for evaluation of vaginal discharge. She reports a clear discharge for the last 2-3 days. She also reports a spot on her labia that is uncomfortable feeling. She denies any vaginal itching. 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 No dysuria or abdominal pain. Her last period was sometime last month. She has had a tubal ligation and is on Ortho Tri-Cyclen.  Past Medical History  Diagnosis Date  . Migraine   . Blurred vision    Past Surgical History  Procedure Laterality Date  . Tubal ligation  2006  . Cholecystectomy  2007   Family History  Problem Relation Age of Onset  . Diabetes Maternal Grandmother     some uncles as well   . High blood pressure Maternal Grandmother     some uncles as well  . Heart attack Father   . Migraines Neg Hx    History  Substance Use Topics  . Smoking status: Never Smoker   . Smokeless tobacco: Never Used  . Alcohol Use: No   OB History    No data available     Review of Systems As in history of present illness Allergies  Nyquil multi-symptom and Shellfish allergy  Home Medications   Prior to Admission medications   Medication Sig Start Date End Date Taking? Authorizing Provider  Cyanocobalamin (B-12 PO) Take 1 tablet by mouth daily.   Yes Historical Provider, MD  ferrous sulfate 325 (65 FE) MG tablet Take 325 mg by mouth daily with breakfast.   Yes Historical Provider, MD  Multiple Vitamin (MULTIVITAMIN WITH MINERALS) TABS tablet Take 1 tablet by mouth daily.   Yes Historical Provider, MD  Norgestimate-Ethinyl Estradiol Triphasic 0.18/0.215/0.25 MG-25 MCG tab Take 1 tablet by mouth  daily.   Yes Historical Provider, MD  SUMAtriptan (IMITREX) 25 MG tablet Take 25 mg by mouth every 2 (two) hours as needed for migraine or headache. May repeat in 2 hours if headache persists or recurs.   Yes Historical Provider, MD  topiramate (TOPAMAX) 25 MG tablet Take 2 tablets (50 mg total) by mouth 2 (two) times daily. Patient taking differently: Take 50 mg by mouth 2 (two) times daily. One in AM and two at bedtime 09/08/14  Yes Anson FretAntonia B Ahern, MD  butalbital-acetaminophen-caffeine (FIORICET WITH CODEINE) 585729876950-325-40-30 MG per capsule Take 1 capsule by mouth every 4 (four) hours as needed for headache.    Historical Provider, MD  SUMAtriptan (IMITREX) 20 MG/ACT nasal spray Place 1 spray (20 mg total) into the nose every 2 (two) hours as needed for migraine or headache. May repeat in 2 hours if headache persists or recurs. Do not use more than 2x a day and 2-3 times a week. 09/08/14   Anson FretAntonia B Ahern, MD  valACYclovir (VALTREX) 500 MG tablet Take 1 tablet (500 mg total) by mouth 2 (two) times daily. For 3 days at start of outbreak 10/04/14   Charm RingsErin J Honig, MD   BP 118/72 mmHg  Pulse 71  Temp(Src) 98.5 F (36.9 C) (Oral)  Resp 16  SpO2 100%  LMP 09/14/2014 Physical Exam  Constitutional: She is oriented to person, place, and time. She  appears well-developed and well-nourished. No distress.  Cardiovascular: Normal rate.   Pulmonary/Chest: Effort normal.  Genitourinary:    Cervix exhibits no discharge. Vaginal discharge (scant white) found.  Erythema   Neurological: She is alert and oriented to person, place, and time.    ED Course  Procedures (including critical care time) Labs Review Labs Reviewed  CERVICOVAGINAL ANCILLARY ONLY    Imaging Review No results found.   MDM   1. Genital herpes    Concern for herpes outbreak. Valtrex 500 mg twice a day for 3 days. Swab sent for gonorrhea, chlamydia, trichomonas, wet prep. Will treat based on results. Follow-up as  needed.    Charm Rings, MD 10/04/14 [phone]

## 2014-10-04 NOTE — ED Notes (Addendum)
C/o vaginal discomfort onset 2-3 days ago with clear discharge.  Hx. Herpes type 2. She thinks its beginning of an outbreak

## 2014-10-05 LAB — CERVICOVAGINAL ANCILLARY ONLY
Chlamydia: NEGATIVE
NEISSERIA GONORRHEA: NEGATIVE

## 2014-10-06 LAB — CERVICOVAGINAL ANCILLARY ONLY
WET PREP (BD AFFIRM): POSITIVE — AB
Wet Prep (BD Affirm): NEGATIVE
Wet Prep (BD Affirm): NEGATIVE

## 2014-10-09 MED ORDER — METRONIDAZOLE 500 MG PO TABS: 500.0000 mg | ORAL_TABLET | Freq: Two times a day (BID) | ORAL | Status: DC

## 2014-10-12 ENCOUNTER — Telehealth (HOSPITAL_COMMUNITY): Payer: Self-pay | Admitting: *Deleted

## 2014-10-12 ENCOUNTER — Emergency Department (HOSPITAL_COMMUNITY)
Admission: EM | Admit: 2014-10-12 | Discharge: 2014-10-12 | Disposition: A | Discharge status: Home / Self Care | Payer: Medicaid Other | Attending: Emergency Medicine | Admitting: Emergency Medicine

## 2014-10-12 ENCOUNTER — Encounter (HOSPITAL_COMMUNITY): Payer: Self-pay | Admitting: Emergency Medicine

## 2014-10-12 ENCOUNTER — Encounter (HOSPITAL_COMMUNITY): Payer: Self-pay | Admitting: *Deleted

## 2014-10-12 DIAGNOSIS — Z8669 Personal history of other diseases of the nervous system and sense organs: Secondary | ICD-10-CM | POA: Insufficient documentation

## 2014-10-12 DIAGNOSIS — R6883 Chills (without fever): Secondary | ICD-10-CM

## 2014-10-12 DIAGNOSIS — R1032 Left lower quadrant pain: Secondary | ICD-10-CM | POA: Diagnosis present

## 2014-10-12 DIAGNOSIS — N939 Abnormal uterine and vaginal bleeding, unspecified: Secondary | ICD-10-CM

## 2014-10-12 DIAGNOSIS — G43909 Migraine, unspecified, not intractable, without status migrainosus: Secondary | ICD-10-CM | POA: Insufficient documentation

## 2014-10-12 DIAGNOSIS — R109 Unspecified abdominal pain: Secondary | ICD-10-CM

## 2014-10-12 DIAGNOSIS — Z3202 Encounter for pregnancy test, result negative: Secondary | ICD-10-CM | POA: Insufficient documentation

## 2014-10-12 DIAGNOSIS — Z79899 Other long term (current) drug therapy: Secondary | ICD-10-CM

## 2014-10-12 DIAGNOSIS — R197 Diarrhea, unspecified: Secondary | ICD-10-CM | POA: Insufficient documentation

## 2014-10-12 DIAGNOSIS — N946 Dysmenorrhea, unspecified: Secondary | ICD-10-CM

## 2014-10-12 DIAGNOSIS — Z9851 Tubal ligation status: Secondary | ICD-10-CM | POA: Diagnosis not present

## 2014-10-12 LAB — URINE MICROSCOPIC-ADD ON

## 2014-10-12 LAB — URINALYSIS, ROUTINE W REFLEX MICROSCOPIC
BILIRUBIN URINE: NEGATIVE
GLUCOSE, UA: NEGATIVE mg/dL
Ketones, ur: 15 mg/dL — AB
Leukocytes, UA: NEGATIVE
Nitrite: NEGATIVE
PH: 8.5 — AB (ref 5.0–8.0)
PROTEIN: 100 mg/dL — AB
Specific Gravity, Urine: 1.023 (ref 1.005–1.030)
UROBILINOGEN UA: 0.2 mg/dL (ref 0.0–1.0)

## 2014-10-12 LAB — POC URINE PREG, ED: PREG TEST UR: NEGATIVE

## 2014-10-12 MED ORDER — ONDANSETRON 4 MG PO TBDP
8.0000 mg | ORAL_TABLET | Freq: Once | ORAL | Status: AC
  Administered 2014-10-12: 8 mg via ORAL
  Filled 2014-10-12: qty 2

## 2014-10-12 MED ORDER — IBUPROFEN 400 MG PO TABS
400.0000 mg | ORAL_TABLET | Freq: Once | ORAL | Status: AC
  Administered 2014-10-12: 400 mg via ORAL
  Filled 2014-10-12: qty 1

## 2014-10-12 MED ORDER — IBUPROFEN 800 MG PO TABS
800.0000 mg | ORAL_TABLET | Freq: Once | ORAL | Status: DC
  Filled 2014-10-12: qty 1

## 2014-10-12 NOTE — ED Provider Notes (Signed)
CSN: [id number]     Arrival date & time 10/12/14  4098 History   First MD Initiated Contact with Patient 10/12/14 1017     Chief Complaint  Patient presents with  . Abdominal Pain  . Back Pain     Patient is a 34 y.o. female presenting with abdominal pain and back pain. The history is provided by the patient.  Abdominal Pain Pain location:  LLQ and RLQ Pain quality: aching and cramping   Pain severity:  Moderate Onset quality:  Gradual Duration: several hours. Timing:  Constant Progression:  Worsening Chronicity:  Recurrent Relieved by:  Nothing Worsened by:  Movement and palpation Associated symptoms: vaginal bleeding   Associated symptoms: no cough, no fever and no vomiting   Back Pain Associated symptoms: abdominal pain   Associated symptoms: no fever   patient reports waking up this morning with low abdominal cramping that radiates into back and also small amt of vaginal bleeding.  She feels this is similar to prior menstrual cycle but appears worse today    Past Medical History  Diagnosis Date  . Migraine   . Blurred vision    Past Surgical History  Procedure Laterality Date  . Tubal ligation  2006  . Cholecystectomy  2007   Family History  Problem Relation Age of Onset  . Diabetes Maternal Grandmother     some uncles as well   . High blood pressure Maternal Grandmother     some uncles as well  . Heart attack Father   . Migraines Neg Hx    History  Substance Use Topics  . Smoking status: Never Smoker   . Smokeless tobacco: Never Used  . Alcohol Use: No   OB History    No data available     Review of Systems  Constitutional: Negative for fever.  Respiratory: Negative for cough.   Gastrointestinal: Positive for abdominal pain. Negative for vomiting.  Genitourinary: Positive for frequency and vaginal bleeding.  Musculoskeletal: Positive for back pain.  All other systems reviewed and are negative.     Allergies  Nyquil multi-symptom and  Shellfish allergy  Home Medications   Prior to Admission medications   Medication Sig Start Date End Date Taking? Authorizing Provider  ibuprofen (ADVIL,MOTRIN) 200 MG tablet Take 200 mg by mouth every 6 (six) hours as needed.   Yes Historical Provider, MD  Multiple Vitamin (MULTIVITAMIN WITH MINERALS) TABS tablet Take 1 tablet by mouth daily.   Yes Historical Provider, MD  Norgestimate-Ethinyl Estradiol Triphasic 0.18/0.215/0.25 MG-25 MCG tab Take 1 tablet by mouth daily.   Yes Historical Provider, MD  oxyCODONE-acetaminophen (PERCOCET/ROXICET) 5-325 MG per tablet Take 1 tablet by mouth every 4 (four) hours as needed for severe pain.   Yes Historical Provider, MD  topiramate (TOPAMAX) 25 MG tablet Take 2 tablets (50 mg total) by mouth 2 (two) times daily. Patient taking differently: Take 50 mg by mouth 2 (two) times daily. One in AM and two at bedtime 09/08/14  Yes Anson Fret, MD  valACYclovir (VALTREX) 500 MG tablet Take 1 tablet (500 mg total) by mouth 2 (two) times daily. For 3 days at start of outbreak 10/04/14  Yes Charm Rings, MD  butalbital-acetaminophen-caffeine (FIORICET WITH CODEINE) [phone] MG per capsule Take 1 capsule by mouth every 4 (four) hours as needed for headache.    Historical Provider, MD  Cyanocobalamin (B-12 PO) Take 1 tablet by mouth daily.    Historical Provider, MD  ferrous sulfate 325 (65 FE) MG  tablet Take 325 mg by mouth daily with breakfast.    Historical Provider, MD  metroNIDAZOLE (FLAGYL) 500 MG tablet Take 1 tablet (500 mg total) by mouth 2 (two) times daily. Patient not taking: Reported on 10/12/2014 10/09/14   Charm RingsErin J Honig, MD  SUMAtriptan [facility](IMITREX) 20 MG/ACT nasal spray Place 1 spray (20 mg total) into the nose every 2 (two) hours as needed for migraine or headache. May repeat in 2 hours if headache persists or recurs. Do not use more than 2x a day and 2-3 times a week. Patient not taking: Reported on 10/12/2014 09/08/14   Anson FretAntonia B Ahern, MD   SUMAtriptan (IMITREX) 25 MG tablet Take 25 mg by mouth every 2 (two) hours as needed for migraine or headache. May repeat in 2 hours if headache persists or recurs.    Historical Provider, MD   BP 106/72 mmHg  Pulse 93  Temp(Src) 97.6 F (36.4 C) (Oral)  Resp 18  SpO2 100%  LMP 10/12/2014 Physical Exam CONSTITUTIONAL: Well developed/well nourished HEAD: Normocephalic/atraumatic EYES: EOMI/PERRL ENMT: Mucous membranes moist NECK: supple no meningeal signs SPINE/BACK:entire spine nontender CV: S1/S2 noted, no murmurs/rubs/gallops noted LUNGS: Lungs are clear to auscultation bilaterally, no apparent distress ABDOMEN: soft, nontender, no rebound or guarding, bowel sounds noted throughout abdomen GU:no cva tenderness NEURO: Pt is awake/alert/appropriate, moves all extremitiesx4.  No facial droop.   EXTREMITIES: pulses normal/equal, full ROM SKIN: warm, color normal PSYCH: no abnormalities of mood noted, alert and oriented to situation  ED Course  Procedures   11:15 AM Records from ED visit on 1/17 reviewed (pt found to have herpes) GC/chlamydia tests negative 12:47 PM PA perform pelvic exam Pt is now without pain She is eating a meal without difficulty She is appropriate for d/c home BP 118/63 mmHg  Pulse 90  Temp(Src) 97.6 F (36.4 C) (Oral)  Resp 18  SpO2 98%  LMP 10/12/2014   Labs Review Labs Reviewed  URINALYSIS, ROUTINE W REFLEX MICROSCOPIC - Abnormal; Notable for the following:    APPearance TURBID (*)    pH 8.5 (*)    Hgb urine dipstick MODERATE (*)    Ketones, ur 15 (*)    Protein, ur 100 (*)    All other components within normal limits  URINE MICROSCOPIC-ADD ON - Abnormal; Notable for the following:    Squamous Epithelial / LPF FEW (*)    Bacteria, UA FEW (*)    All other components within normal limits  POC URINE PREG, ED   Medications  ondansetron (ZOFRAN-ODT) disintegrating tablet 8 mg (8 mg Oral Given 10/12/14 1055)  ibuprofen (ADVIL,MOTRIN)  tablet 400 mg (400 mg Oral Given 10/12/14 1137)     MDM   Final diagnoses:  Abdominal pain, unspecified abdominal location  Vaginal bleeding    Nursing notes including past medical history and social history reviewed and considered in documentation Labs/vital reviewed myself and considered during evaluation Previous records reviewed and considered     Joya Gaskinsonald W Richell Corker, MD 10/12/14 1248

## 2014-10-12 NOTE — ED Notes (Signed)
Pt. reports generalized abdominal cramping onset this morning with her 1st day of menstrual cycle , pain radiating to lower back , seen here this morning with the same complaint and was discharged home .

## 2014-10-12 NOTE — ED Provider Notes (Signed)
Patient seen and assessed by Dr Bebe ShaggyWickline.  I was asked to perform pelvic exam only.  No swabs necessary unless clinically indicated by exam given recent exam.    Pelvic performed by Barbaraann BoysJordyn Reisenauer, PA-S, under my direct supervision.  I repeated the bimanual myself for confirmation.   External genitalia unremarkable.  Vagina without significant discharge, slight blood tinged discharge.  Nontender vagina.  Cervix nontender, no discharge.  On bimanual there is no cervical motion tenderness, no midline or adnexal tenderness or fullness.     Further workup and disposition per Dr Bebe ShaggyWickline.    Trixie Dredgemily Shalie Schremp, PA-C 10/12/14 1246  Joya Gaskinsonald W Wickline, MD 10/12/14 1248

## 2014-10-12 NOTE — Discharge Instructions (Signed)

## 2014-10-12 NOTE — ED Notes (Signed)
Labs from 1/17:  GC/Chlamydia neg., Affirm: Candida and Trich neg., Gardnerella pos.  1/20 Message sent to Dr. Piedad ClimesHonig.  1/22 She e-prescribed Flagyl to pt.'s pharmacy.  I called pt. Pt. verified x 2 and given results.  Pt. told she needs Flagyl for bacterial vaginosis and where to pick up her Rx.   Pt. instructed to no alcohol while taking this medication.  Pt. voiced understanding. Vassie MoselleYork, Jaynell Castagnola M 10/12/2014

## 2014-10-12 NOTE — ED Notes (Signed)
Pt reports starting her menstrual this am and now having severe lower abd pain and cramps, having nausea.

## 2014-10-13 ENCOUNTER — Emergency Department (HOSPITAL_COMMUNITY)
Admission: EM | Admit: 2014-10-13 | Discharge: 2014-10-13 | Disposition: A | Discharge status: Home / Self Care | Payer: Medicaid Other | Source: Home / Self Care | Attending: Emergency Medicine | Admitting: Emergency Medicine

## 2014-10-13 DIAGNOSIS — N946 Dysmenorrhea, unspecified: Secondary | ICD-10-CM

## 2014-10-13 MED ORDER — IBUPROFEN 800 MG PO TABS: 800.0000 mg | ORAL_TABLET | Freq: Three times a day (TID) | ORAL | Status: DC | PRN

## 2014-10-13 MED ORDER — ONDANSETRON 8 MG PO TBDP: 8.0000 mg | ORAL_TABLET | Freq: Three times a day (TID) | ORAL | Status: DC | PRN

## 2014-10-13 NOTE — ED Notes (Signed)
Pt made aware to return if symptoms worsen or if any life threatening symptoms occur.   

## 2014-10-13 NOTE — ED Notes (Signed)
Pt reports lower abdominal pain since this morning accompanied by nausea and loose stool.  Pt reports being on period.  Pt seen here today for same complaint, given advil with relief, and returned to ER due to increased pain.

## 2014-10-13 NOTE — ED Provider Notes (Signed)
CSN: [id number]     Arrival date & time 10/12/14  2342 History  This chart was scribed for Hanley Seamen, MD by Abel Presto, ED Scribe. This patient was seen in room A05C/A05C and the patient's care was started at 2:25 AM.    Chief Complaint  Patient presents with  . Dysmenorrhea     The history is provided by the patient. No language interpreter was used.   HPI Comments: Brittney Watkins is a 34 y.o. female with PMHx of migraine and PSH of tubal ligation and cholecystectomy who was seen at our urgent care on 1/17 and prescribed Flagyl for bacterial vaginosis on 1/22. She presents to the Emergency Department complaining of dysmenorrhea with onset yesterday along with vaginal bleeding.  Pt describes the pelvic pain as sharp and crampy, moderate to severe. Pt notes associated nausea, diarrhea and chills. Pt was seen in ED yesterday for same chief complaint and improved with ibuprofen and Zofran. Pt took 800 mg ibuprofen around 10:30 PM last night. Pt states pain resolved while in waiting room. Pt denies fever and vomiting.   Past Medical History  Diagnosis Date  . Migraine   . Blurred vision    Past Surgical History  Procedure Laterality Date  . Tubal ligation  2006  . Cholecystectomy  2007   Family History  Problem Relation Age of Onset  . Diabetes Maternal Grandmother     some uncles as well   . High blood pressure Maternal Grandmother     some uncles as well  . Heart attack Father   . Migraines Neg Hx    History  Substance Use Topics  . Smoking status: Never Smoker   . Smokeless tobacco: Never Used  . Alcohol Use: No   OB History    No data available     Review of Systems  Genitourinary: Positive for pelvic pain.  All other systems reviewed and are negative.   Allergies  Nyquil multi-symptom and Shellfish allergy  Home Medications   Prior to Admission medications   Medication Sig Start Date End Date Taking? Authorizing Provider   butalbital-acetaminophen-caffeine (FIORICET WITH CODEINE) 50-325-40-30 MG per capsule Take 1 capsule by mouth every 4 (four) hours as needed for headache.    Historical Provider, MD  Cyanocobalamin (B-12 PO) Take 1 tablet by mouth daily.    Historical Provider, MD  ferrous sulfate 325 (65 FE) MG tablet Take 325 mg by mouth daily with breakfast.    Historical Provider, MD  ibuprofen (ADVIL,MOTRIN) 800 MG tablet Take 1 tablet (800 mg total) by mouth 3 (three) times daily as needed for cramping. 10/13/14   Carlisle Beers Dyamond Tolosa, MD  metroNIDAZOLE (FLAGYL) 500 MG tablet Take 1 tablet (500 mg total) by mouth 2 (two) times daily. Patient not taking: Reported on 10/12/2014 10/09/14   Charm Rings, MD  Multiple Vitamin (MULTIVITAMIN WITH MINERALS) TABS tablet Take 1 tablet by mouth daily.    Historical Provider, MD  Norgestimate-Ethinyl Estradiol Triphasic 0.18/0.215/0.25 MG-25 MCG tab Take 1 tablet by mouth daily.    Historical Provider, MD  ondansetron (ZOFRAN ODT) 8 MG disintegrating tablet Take 1 tablet (8 mg total) by mouth every 8 (eight) hours as needed for nausea or vomiting.  ODT q4 hours prn nausea 10/13/14   Carlisle Beers Kayin Osment, MD  oxyCODONE-acetaminophen (PERCOCET/ROXICET) 5-325 MG per tablet Take 1 tablet by mouth every 4 (four) hours as needed for severe pain.    Historical Provider, MD  SUMAtriptan (IMITREX) 20 MG/ACT nasal  spray Place 1 spray (20 mg total) into the nose every 2 (two) hours as needed for migraine or headache. May repeat in 2 hours if headache persists or recurs. Do not use more than 2x a day and 2-3 times a week. Patient not taking: Reported on 10/12/2014 09/08/14   Anson FretAntonia B Ahern, MD  SUMAtriptan (IMITREX) 25 MG tablet Take 25 mg by mouth every 2 (two) hours as needed for migraine or headache. May repeat in 2 hours if headache persists or recurs.    Historical Provider, MD  topiramate (TOPAMAX) 25 MG tablet Take 2 tablets (50 mg total) by mouth 2 (two) times daily. Patient taking  differently: Take 50 mg by mouth 2 (two) times daily. One in AM and two at bedtime 09/08/14   Anson FretAntonia B Ahern, MD  valACYclovir (VALTREX) 500 MG tablet Take 1 tablet (500 mg total) by mouth 2 (two) times daily. For 3 days at start of outbreak 10/04/14   Charm RingsErin J Honig, MD   BP 112/68 mmHg  Pulse 68  Temp(Src) 98.1 F (36.7 C) (Oral)  Resp 18  Ht 5\' 2"  (1.575 m)  Wt 134 lb (60.782 kg)  BMI 24.50 kg/m2  SpO2 100%  LMP 10/12/2014   Physical Exam  Nursing note and vitals reviewed. General: Well-developed, well-nourished female in no acute distress; appearance consistent with age of record HENT: normocephalic; atraumatic Eyes: pupils equal, round and reactive to light; extraocular muscles intact Neck: supple Heart: regular rate and rhythm; no murmurs, rubs or gallops Lungs: clear to auscultation bilaterally Abdomen: soft; nondistended; nontender; no masses or hepatosplenomegaly; bowel sounds present Extremities: No deformity; full range of motion; pulses normal Neurologic: Awake, alert and oriented; motor function intact in all extremities and symmetric; no facial droop Skin: Warm and dry Psychiatric: Normal mood and affect   ED Course  Procedures (including critical care time) DIAGNOSTIC STUDIES: Oxygen Saturation is 100% on room air, normal by my interpretation.    COORDINATION OF CARE: 2:28 AM Discussed treatment plan with patient at beside, the patient agrees with the plan and has no further questions at this time.   MDM   Final diagnoses:  Dysmenorrhea   I personally performed the services described in this documentation, which was scribed in my presence. The recorded information has been reviewed and is accurate.     Hanley SeamenJohn L Andray Assefa, MD 10/13/14 [id number]

## 2014-10-13 NOTE — Discharge Instructions (Signed)

## 2014-11-26 ENCOUNTER — Ambulatory Visit (INDEPENDENT_AMBULATORY_CARE_PROVIDER_SITE_OTHER): Payer: Medicaid Other | Admitting: *Deleted

## 2014-11-26 ENCOUNTER — Telehealth: Payer: Self-pay | Admitting: Neurology

## 2014-11-26 ENCOUNTER — Other Ambulatory Visit: Payer: Self-pay | Admitting: Neurology

## 2014-11-26 DIAGNOSIS — G4459 Other complicated headache syndrome: Secondary | ICD-10-CM

## 2014-11-26 MED ORDER — KETOROLAC TROMETHAMINE 30 MG/ML IJ SOLN
30.0000 mg | Freq: Once | INTRAMUSCULAR | Status: AC
  Administered 2014-11-26: 30 mg via INTRAVENOUS

## 2014-11-26 MED ORDER — VALPROATE SODIUM 500 MG/5ML IV SOLN
1000.0000 mg | INTRAVENOUS | Status: DC
  Administered 2014-11-26: 1000 mg via INTRAVENOUS

## 2014-11-26 MED ORDER — SODIUM CHLORIDE 0.9 % IV SOLN
500.0000 mg | INTRAVENOUS | Status: DC
  Administered 2014-11-26: 500 mg via INTRAVENOUS

## 2014-11-26 NOTE — Progress Notes (Signed)
Gave 1000mg  Depacon, 10mg  Toradol, and 500mg  Solumedrol. Used aseptic technique while placing IV and patient tolerated well per Fabian Sharpina Benson. Patient stated headache was "7/10" on the pain scale before infusion.

## 2014-11-26 NOTE — Telephone Encounter (Signed)
Left message for the patient to call us back. Gave [facility] phone number and office hours.  

## 2014-11-26 NOTE — Telephone Encounter (Signed)
Patient is calling in as she is having Migraines that her meds won't touch.  Dr Lucia GaskinsAhern told her to come in for an infusion instead of going to the hospital if needed.  Please call her.  thanks

## 2014-11-26 NOTE — Telephone Encounter (Signed)
Talked with Brittney SladeSharel and he told me he would call her and [facility] migraine cocktail. He verbalized understanding and said he would let her know.

## 2014-11-27 LAB — COMPREHENSIVE METABOLIC PANEL
A/G RATIO: 1.5 (ref 1.1–2.5)
ALT: 17 IU/L (ref 0–32)
AST: 19 IU/L (ref 0–40)
Albumin: 4.1 g/dL (ref 3.5–5.5)
Alkaline Phosphatase: 40 IU/L (ref 39–117)
BUN/Creatinine Ratio: 13 (ref 8–20)
BUN: 12 mg/dL (ref 6–20)
Bilirubin Total: 0.2 mg/dL (ref 0.0–1.2)
CHLORIDE: 103 mmol/L (ref 97–108)
CO2: 18 mmol/L (ref 18–29)
CREATININE: 0.93 mg/dL (ref 0.57–1.00)
Calcium: 9 mg/dL (ref 8.7–10.2)
GFR, EST AFRICAN AMERICAN: 93 mL/min/{1.73_m2} (ref >59)
GFR, EST NON AFRICAN AMERICAN: 81 mL/min/{1.73_m2} (ref >59)
GLUCOSE: 108 mg/dL — AB (ref 65–99)
Globulin, Total: 2.7 g/dL (ref 1.5–4.5)
Potassium: 4 mmol/L (ref 3.5–5.2)
Sodium: 139 mmol/L (ref 134–144)
TOTAL PROTEIN: 6.8 g/dL (ref 6.0–8.5)

## 2014-11-27 LAB — TOPIRAMATE LEVEL: Topiramate Lvl: 1.9 ug/mL — ABNORMAL LOW (ref 2.0–25.0)

## 2015-01-14 ENCOUNTER — Encounter (HOSPITAL_COMMUNITY): Payer: Self-pay | Admitting: Emergency Medicine

## 2015-01-14 ENCOUNTER — Emergency Department (INDEPENDENT_AMBULATORY_CARE_PROVIDER_SITE_OTHER)
Admission: EM | Admit: 2015-01-14 | Discharge: 2015-01-14 | Disposition: A | Discharge status: Home / Self Care | Payer: Self-pay | Source: Home / Self Care | Attending: Emergency Medicine | Admitting: Emergency Medicine

## 2015-01-14 DIAGNOSIS — G43009 Migraine without aura, not intractable, without status migrainosus: Secondary | ICD-10-CM

## 2015-01-14 MED ORDER — KETOROLAC TROMETHAMINE 60 MG/2ML IM SOLN
INTRAMUSCULAR | Status: AC
  Filled 2015-01-14: qty 2

## 2015-01-14 MED ORDER — DIPHENHYDRAMINE HCL 50 MG/ML IJ SOLN
INTRAMUSCULAR | Status: AC
  Filled 2015-01-14: qty 1

## 2015-01-14 MED ORDER — DIPHENHYDRAMINE HCL 50 MG/ML IJ SOLN
25.0000 mg | Freq: Once | INTRAMUSCULAR | Status: AC
  Administered 2015-01-14: 25 mg via INTRAMUSCULAR

## 2015-01-14 MED ORDER — METOCLOPRAMIDE HCL 5 MG/ML IJ SOLN
10.0000 mg | Freq: Once | INTRAMUSCULAR | Status: AC
  Administered 2015-01-14: 10 mg via INTRAMUSCULAR

## 2015-01-14 MED ORDER — KETOROLAC TROMETHAMINE 60 MG/2ML IM SOLN
60.0000 mg | Freq: Once | INTRAMUSCULAR | Status: AC
  Administered 2015-01-14: 60 mg via INTRAMUSCULAR

## 2015-01-14 MED ORDER — METOCLOPRAMIDE HCL 5 MG/ML IJ SOLN
INTRAMUSCULAR | Status: AC
  Filled 2015-01-14: qty 2

## 2015-01-14 NOTE — ED Provider Notes (Signed)
CSN: [id number]     Arrival date & time 01/14/15  1735 History   First MD Initiated Contact with Patient 01/14/15 1925     Chief Complaint  Patient presents with  . Migraine   (Consider location/radiation/quality/duration/timing/severity/associated sxs/prior Treatment) HPI Comments: Pleasant 34 year old female with a history of migraines presents with a typical migraine for her. She states is located primarily to the right temporal area and behind the right eye. She has mild blurred vision, nausea without vomiting. Symptoms began yesterday. She took an Imitrex tablet which had worked in the past but did not work yesterday. She then used Imitrex spray which helped a great deal and allow her to work. The headache returned a few hours later that she was out of spray and cannot get it refilled. She denies problems with speech, hearing, swallowing, focal paresthesias or weakness. Denies confusion, disorientation, memory problems.   Past Medical History  Diagnosis Date  . Migraine   . Blurred vision    Past Surgical History  Procedure Laterality Date  . Tubal ligation  2006  . Cholecystectomy  2007   Family History  Problem Relation Age of Onset  . Diabetes Maternal Grandmother     some uncles as well   . High blood pressure Maternal Grandmother     some uncles as well  . Heart attack Father   . Migraines Neg Hx    History  Substance Use Topics  . Smoking status: Never Smoker   . Smokeless tobacco: Never Used  . Alcohol Use: No   OB History    No data available     Review of Systems  Constitutional: Positive for activity change. Negative for fever and fatigue.  Eyes: Positive for photophobia and visual disturbance. Negative for discharge and redness.  Respiratory: Negative.   Cardiovascular: Negative.   Gastrointestinal: Positive for nausea. Negative for vomiting.  Genitourinary: Negative.   Musculoskeletal: Negative.   Skin: Negative.   Neurological: Positive for  headaches. Negative for dizziness, tremors, seizures, syncope, speech difficulty, weakness, light-headedness and numbness.  Psychiatric/Behavioral: Negative.     Allergies  Nyquil multi-symptom and Shellfish allergy  Home Medications   Prior to Admission medications   Medication Sig Start Date End Date Taking? Authorizing Provider  SUMAtriptan (IMITREX) 20 MG/ACT nasal spray Place 1 spray (20 mg total) into the nose every 2 (two) hours as needed for migraine or headache. May repeat in 2 hours if headache persists or recurs. Do not use more than 2x a day and 2-3 times a week. 09/08/14  Yes Anson FretAntonia B Ahern, MD  butalbital-acetaminophen-caffeine (FIORICET WITH CODEINE) [phone] MG per capsule Take 1 capsule by mouth every 4 (four) hours as needed for headache.    Historical Provider, MD  Cyanocobalamin (B-12 PO) Take 1 tablet by mouth daily.    Historical Provider, MD  ferrous sulfate 325 (65 FE) MG tablet Take 325 mg by mouth daily with breakfast.    Historical Provider, MD  ibuprofen (ADVIL,MOTRIN) 800 MG tablet Take 1 tablet (800 mg total) by mouth 3 (three) times daily as needed for cramping. 10/13/14   Paula LibraJohn Molpus, MD  metroNIDAZOLE (FLAGYL) 500 MG tablet Take 1 tablet (500 mg total) by mouth 2 (two) times daily. Patient not taking: Reported on 10/12/2014 10/09/14   Charm RingsErin J Honig, MD  Multiple Vitamin (MULTIVITAMIN WITH MINERALS) TABS tablet Take 1 tablet by mouth daily.    Historical Provider, MD  Norgestimate-Ethinyl Estradiol Triphasic 0.18/0.215/0.25 MG-25 MCG tab Take 1 tablet by mouth daily.  Historical Provider, MD  ondansetron (ZOFRAN ODT) 8 MG disintegrating tablet Take 1 tablet (8 mg total) by mouth every 8 (eight) hours as needed for nausea or vomiting.  ODT q4 hours prn nausea 10/13/14   Paula Libra, MD  oxyCODONE-acetaminophen (PERCOCET/ROXICET) 5-325 MG per tablet Take 1 tablet by mouth every 4 (four) hours as needed for severe pain.    Historical Provider, MD  SUMAtriptan  (IMITREX) 25 MG tablet Take 25 mg by mouth every 2 (two) hours as needed for migraine or headache. May repeat in 2 hours if headache persists or recurs.    Historical Provider, MD  topiramate (TOPAMAX) 25 MG tablet Take 2 tablets (50 mg total) by mouth 2 (two) times daily. Patient taking differently: Take 50 mg by mouth 2 (two) times daily. One in AM and two at bedtime 09/08/14   Anson Fret, MD  valACYclovir (VALTREX) 500 MG tablet Take 1 tablet (500 mg total) by mouth 2 (two) times daily. For 3 days at start of outbreak 10/04/14   Charm Rings, MD   BP 117/86 mmHg  Pulse 61  Temp(Src) 98.1 F (36.7 C) (Oral)  Resp 16  SpO2 100%  LMP 12/28/2014 Physical Exam  Constitutional: She is oriented to person, place, and time. She appears well-developed and well-nourished. No distress.  HENT:  Head: Normocephalic and atraumatic.  Mouth/Throat: Oropharynx is clear and moist. No oropharyngeal exudate.  Bilateral TMs are normal  Eyes: Conjunctivae and EOM are normal. Pupils are equal, round, and reactive to light.  Neck: Normal range of motion. Neck supple.  Cardiovascular: Normal rate and normal heart sounds.   Pulmonary/Chest: Effort normal and breath sounds normal. No respiratory distress. She has no wheezes. She has no rales.  Abdominal: Soft. There is no tenderness.  Musculoskeletal: Normal range of motion.  Lymphadenopathy:    She has no cervical adenopathy.  Neurological: She is alert and oriented to person, place, and time. She has normal strength. No cranial nerve deficit or sensory deficit. She exhibits normal muscle tone. Coordination normal.  No dyskinesia, no dysdiadochokinesia  Skin: Skin is warm and dry.  Psychiatric: She has a normal mood and affect.  Nursing note and vitals reviewed.   ED Course  Procedures (including critical care time) Labs Review Labs Reviewed - No data to display  Imaging Review No results found.   MDM   1. Migraine without aura and without  status migrainosus, not intractable    Toradol 60 mg IM Reglan 10 mg IM Benadryl 25 mg IM Steffanie Dunn PCP tomorrow to obtain Imitrex. Patient needs prior authorization.      Hayden Rasmussen, NP 01/14/15 [phone]

## 2015-01-14 NOTE — ED Notes (Signed)
C/o  Migraine headache.  Sinus pressure and pain.  Nausea and blurred vision.   Pt has tried imitrex and nasal spray with no relief .  Symptoms present since yesterday.

## 2015-01-14 NOTE — Discharge Instructions (Signed)

## 2015-07-24 ENCOUNTER — Emergency Department (HOSPITAL_COMMUNITY)
Admission: EM | Admit: 2015-07-24 | Discharge: 2015-07-24 | Disposition: A | Discharge status: Home / Self Care | Payer: BC Managed Care – PPO | Attending: Emergency Medicine | Admitting: Emergency Medicine

## 2015-07-24 ENCOUNTER — Emergency Department (HOSPITAL_COMMUNITY): Payer: BC Managed Care – PPO

## 2015-07-24 ENCOUNTER — Encounter (HOSPITAL_COMMUNITY): Payer: Self-pay | Admitting: *Deleted

## 2015-07-24 DIAGNOSIS — R102 Pelvic and perineal pain: Secondary | ICD-10-CM

## 2015-07-24 DIAGNOSIS — Z3202 Encounter for pregnancy test, result negative: Secondary | ICD-10-CM | POA: Diagnosis not present

## 2015-07-24 DIAGNOSIS — G43909 Migraine, unspecified, not intractable, without status migrainosus: Secondary | ICD-10-CM | POA: Insufficient documentation

## 2015-07-24 DIAGNOSIS — Z79818 Long term (current) use of other agents affecting estrogen receptors and estrogen levels: Secondary | ICD-10-CM | POA: Insufficient documentation

## 2015-07-24 DIAGNOSIS — R1032 Left lower quadrant pain: Secondary | ICD-10-CM | POA: Diagnosis present

## 2015-07-24 DIAGNOSIS — Z9851 Tubal ligation status: Secondary | ICD-10-CM | POA: Insufficient documentation

## 2015-07-24 DIAGNOSIS — Z79899 Other long term (current) drug therapy: Secondary | ICD-10-CM | POA: Diagnosis not present

## 2015-07-24 DIAGNOSIS — N940 Mittelschmerz: Secondary | ICD-10-CM | POA: Insufficient documentation

## 2015-07-24 DIAGNOSIS — B9689 Other specified bacterial agents as the cause of diseases classified elsewhere: Secondary | ICD-10-CM

## 2015-07-24 DIAGNOSIS — Z9049 Acquired absence of other specified parts of digestive tract: Secondary | ICD-10-CM | POA: Diagnosis not present

## 2015-07-24 DIAGNOSIS — N760 Acute vaginitis: Secondary | ICD-10-CM | POA: Insufficient documentation

## 2015-07-24 LAB — COMPREHENSIVE METABOLIC PANEL
ALK PHOS: 44 U/L (ref 38–126)
ALT: 17 U/L (ref 14–54)
ANION GAP: 6 (ref 5–15)
AST: 24 U/L (ref 15–41)
Albumin: 4 g/dL (ref 3.5–5.0)
BILIRUBIN TOTAL: 0.7 mg/dL (ref 0.3–1.2)
BUN: 10 mg/dL (ref 6–20)
CALCIUM: 9.3 mg/dL (ref 8.9–10.3)
CO2: 27 mmol/L (ref 22–32)
Chloride: 105 mmol/L (ref 101–111)
Creatinine, Ser: 0.89 mg/dL (ref 0.44–1.00)
Glucose, Bld: 92 mg/dL (ref 65–99)
Potassium: 3.7 mmol/L (ref 3.5–5.1)
Sodium: 138 mmol/L (ref 135–145)
Total Protein: 6.9 g/dL (ref 6.5–8.1)

## 2015-07-24 LAB — URINE MICROSCOPIC-ADD ON

## 2015-07-24 LAB — URINALYSIS, ROUTINE W REFLEX MICROSCOPIC
Bilirubin Urine: NEGATIVE
Glucose, UA: NEGATIVE mg/dL
HGB URINE DIPSTICK: NEGATIVE
Ketones, ur: 15 mg/dL — AB
Nitrite: NEGATIVE
Protein, ur: NEGATIVE mg/dL
Specific Gravity, Urine: 1.028 (ref 1.005–1.030)
UROBILINOGEN UA: 0.2 mg/dL (ref 0.0–1.0)
pH: 6 (ref 5.0–8.0)

## 2015-07-24 LAB — CBC
HCT: 37 % (ref 36.0–46.0)
Hemoglobin: 12.2 g/dL (ref 12.0–15.0)
MCH: 26.9 pg (ref 26.0–34.0)
MCHC: 33 g/dL (ref 30.0–36.0)
MCV: 81.5 fL (ref 78.0–100.0)
Platelets: 149 10*3/uL — ABNORMAL LOW (ref 150–400)
RBC: 4.54 MIL/uL (ref 3.87–5.11)
RDW: 13.6 % (ref 11.5–15.5)
WBC: 6.4 10*3/uL (ref 4.0–10.5)

## 2015-07-24 LAB — WET PREP, GENITAL
Trich, Wet Prep: NONE SEEN
YEAST WET PREP: NONE SEEN

## 2015-07-24 LAB — I-STAT BETA HCG BLOOD, ED (MC, WL, AP ONLY): I-stat hCG, quantitative: <5 m[IU]/mL (ref <5)

## 2015-07-24 LAB — LIPASE, BLOOD: Lipase: 32 U/L (ref 11–51)

## 2015-07-24 MED ORDER — METRONIDAZOLE 500 MG PO TABS: 500.0000 mg | ORAL_TABLET | Freq: Two times a day (BID) | ORAL | Status: DC

## 2015-07-24 MED ORDER — CEFTRIAXONE SODIUM 250 MG IJ SOLR
250.0000 mg | Freq: Once | INTRAMUSCULAR | Status: AC
  Administered 2015-07-24: 250 mg via INTRAMUSCULAR
  Filled 2015-07-24: qty 250

## 2015-07-24 MED ORDER — AZITHROMYCIN 250 MG PO TABS
1000.0000 mg | ORAL_TABLET | Freq: Once | ORAL | Status: AC
  Administered 2015-07-24: 1000 mg via ORAL
  Filled 2015-07-24: qty 4

## 2015-07-24 MED ORDER — LIDOCAINE HCL (PF) 1 % IJ SOLN
INTRAMUSCULAR | Status: AC
  Administered 2015-07-24: 5 mL
  Filled 2015-07-24: qty 5

## 2015-07-24 MED ORDER — NAPROXEN 500 MG PO TABS: 500.0000 mg | ORAL_TABLET | Freq: Two times a day (BID) | ORAL | Status: DC | PRN

## 2015-07-24 NOTE — ED Provider Notes (Signed)
CSN: [id number]     Arrival date & time 07/24/15  1314 History   First MD Initiated Contact with Patient 07/24/15 1612     Chief Complaint  Patient presents with  . Abdominal Pain     (Consider location/radiation/quality/duration/timing/severity/associated sxs/prior Treatment) HPI Comments: Brittney Watkins is a 34 y.o. female with a PMHx of chronic migraines and blurred vision, with a PSHx of tubal ligation and cholecystectomy, who presents to the ED with complaints of intermittent abdominal pain 1 month worse over the last 2 days. She reports her pain is 7/10 intermittent left lower quadrant pain which is sharp and nonradiating, worse with walking, relieved with lying down and taking a pain medication as given to her by her doctor for cramps, and currently resolved at this time. She states this feels similar to her prior episodes with abdominal pain that she usually gets with her menstrual cycle. LMP 07/13/15. She is sexually active with one female partner, unprotected.  She denies any fevers, chills, chest pain, shortness breath, nausea, vomiting, diarrhea, constipation, obstipation, melena, hematochezia, dysuria, hematuria, vaginal bleeding or discharge, numbness, tingling, or weakness. Denies any recent travel, sick contacts, suspicious food intake, alcohol use, NSAID use, or recent antibiotics.  Patient is a 34 y.o. female presenting with abdominal pain. The history is provided by the patient. No language interpreter was used.  Abdominal Pain Pain location:  LLQ Pain quality: sharp   Pain radiates to:  Does not radiate Pain severity:  Mild Onset quality:  Gradual Duration:  4 weeks Timing:  Intermittent Progression:  Resolved Chronicity:  Recurrent Context: not recent travel, not sick contacts and not suspicious food intake   Relieved by:  Lying down (and unknown pain medication given to her for cramps) Worsened by:  Movement Ineffective treatments:  None tried Associated symptoms:  no chest pain, no chills, no constipation, no diarrhea, no dysuria, no fever, no hematuria, no nausea, no shortness of breath, no vaginal bleeding, no vaginal discharge and no vomiting   Risk factors: no alcohol abuse, has not had multiple surgeries and no NSAID use     Past Medical History  Diagnosis Date  . Migraine   . Blurred vision    Past Surgical History  Procedure Laterality Date  . Tubal ligation  2006  . Cholecystectomy  2007   Family History  Problem Relation Age of Onset  . Diabetes Maternal Grandmother     some uncles as well   . High blood pressure Maternal Grandmother     some uncles as well  . Heart attack Father   . Migraines Neg Hx    Social History  Substance Use Topics  . Smoking status: Never Smoker   . Smokeless tobacco: Never Used  . Alcohol Use: No   OB History    No data available     Review of Systems  Constitutional: Negative for fever and chills.  Respiratory: Negative for shortness of breath.   Cardiovascular: Negative for chest pain.  Gastrointestinal: Positive for abdominal pain. Negative for nausea, vomiting, diarrhea, constipation and blood in stool.  Genitourinary: Negative for dysuria, hematuria, vaginal bleeding and vaginal discharge.  Musculoskeletal: Negative for myalgias and arthralgias.  Skin: Negative for color change.  Allergic/Immunologic: Negative for immunocompromised state.  Neurological: Negative for weakness and numbness.  Psychiatric/Behavioral: Negative for confusion.   10 Systems reviewed and are negative for acute change except as noted in the HPI.    Allergies  Nyquil multi-symptom and Shellfish allergy  Home Medications  Prior to Admission medications   Medication Sig Start Date End Date Taking? Authorizing Provider  butalbital-acetaminophen-caffeine (FIORICET WITH CODEINE) 50-325-40-30 MG per capsule Take 1 capsule by mouth every 4 (four) hours as needed for headache.    Historical Provider, MD    Cyanocobalamin (B-12 PO) Take 1 tablet by mouth daily.    Historical Provider, MD  ferrous sulfate 325 (65 FE) MG tablet Take 325 mg by mouth daily with breakfast.    Historical Provider, MD  ibuprofen (ADVIL,MOTRIN) 800 MG tablet Take 1 tablet (800 mg total) by mouth 3 (three) times daily as needed for cramping. 10/13/14   Paula Libra, MD  metroNIDAZOLE (FLAGYL) 500 MG tablet Take 1 tablet (500 mg total) by mouth 2 (two) times daily. Patient not taking: Reported on 10/12/2014 10/09/14   Charm Rings, MD  Multiple Vitamin (MULTIVITAMIN WITH MINERALS) TABS tablet Take 1 tablet by mouth daily.    Historical Provider, MD  Norgestimate-Ethinyl Estradiol Triphasic 0.18/0.215/0.25 MG-25 MCG tab Take 1 tablet by mouth daily.    Historical Provider, MD  ondansetron (ZOFRAN ODT) 8 MG disintegrating tablet Take 1 tablet (8 mg total) by mouth every 8 (eight) hours as needed for nausea or vomiting. 8mg  ODT q4 hours prn nausea 10/13/14   Paula Libra, MD  oxyCODONE-acetaminophen (PERCOCET/ROXICET) 5-325 MG per tablet Take 1 tablet by mouth every 4 (four) hours as needed for severe pain.    Historical Provider, MD  SUMAtriptan (IMITREX) 20 MG/ACT nasal spray Place 1 spray (20 mg total) into the nose every 2 (two) hours as needed for migraine or headache. May repeat in 2 hours if headache persists or recurs. Do not use more than 2x a day and 2-3 times a week. 09/08/14   Anson Fret, MD  SUMAtriptan (IMITREX) 25 MG tablet Take 25 mg by mouth every 2 (two) hours as needed for migraine or headache. May repeat in 2 hours if headache persists or recurs.    Historical Provider, MD  topiramate (TOPAMAX) 25 MG tablet Take 2 tablets (50 mg total) by mouth 2 (two) times daily. Patient taking differently: Take 50 mg by mouth 2 (two) times daily. One in AM and two at bedtime 09/08/14   Anson Fret, MD  valACYclovir (VALTREX) 500 MG tablet Take 1 tablet (500 mg total) by mouth 2 (two) times daily. For 3 days at start of  outbreak 10/04/14   Charm Rings, MD   BP 111/66 mmHg  Pulse 79  Temp(Src) 98.6 F (37 C) (Oral)  Resp 16  Ht 5\' 2"  (1.575 m)  Wt 134 lb 1.6 oz (60.827 kg)  BMI 24.52 kg/m2  SpO2 100%  LMP 07/07/2015 Physical Exam  Constitutional: She is oriented to person, place, and time. Vital signs are normal. She appears well-developed and well-nourished.  Non-toxic appearance. No distress.  Afebrile, nontoxic, NAD  HENT:  Head: Normocephalic and atraumatic.  Mouth/Throat: Oropharynx is clear and moist and mucous membranes are normal.  Eyes: Conjunctivae and EOM are normal. Right eye exhibits no discharge. Left eye exhibits no discharge.  Neck: Normal range of motion. Neck supple.  Cardiovascular: Normal rate, regular rhythm, normal heart sounds and intact distal pulses.  Exam reveals no gallop and no friction rub.   No murmur heard. Pulmonary/Chest: Effort normal and breath sounds normal. No respiratory distress. She has no decreased breath sounds. She has no wheezes. She has no rhonchi. She has no rales.  Abdominal: Soft. Normal appearance and bowel sounds are normal. She exhibits  no distension. There is tenderness in the left lower quadrant. There is no rigidity, no rebound, no guarding, no CVA tenderness, no tenderness at McBurney's point and negative Murphy's sign.    Soft, nondistended, +BS throughout, with mild LLQ TTP, no r/g/r, neg murphy's, neg mcburney's, no CVA TTP   Genitourinary: Vagina normal and uterus normal. Pelvic exam was performed with patient supine. There is no rash, tenderness or lesion on the right labia. There is no rash, tenderness or lesion on the left labia. Cervix exhibits no motion tenderness, no discharge and no friability. Right adnexum displays no mass, no tenderness and no fullness. Left adnexum displays tenderness. Left adnexum displays no mass and no fullness. No erythema or bleeding in the vagina. No vaginal discharge found.  Chaperone present for exam. No  rashes, lesions, or tenderness to external genitalia. No erythema, injury, or tenderness to vaginal mucosa. No vaginal discharge or bleeding within vaginal vault. No adnexal masses or fullness but with mild L sided adnexal tenderness. No CMT, cervical friability, or discharge from cervical os. Uterus non-deviated, mobile, nonTTP, and without enlargement.    Musculoskeletal: Normal range of motion.  Neurological: She is alert and oriented to person, place, and time. She has normal strength. No sensory deficit.  Skin: Skin is warm, dry and intact. No rash noted.  Psychiatric: She has a normal mood and affect.  Nursing note and vitals reviewed.   ED Course  Procedures (including critical care time) Labs Review Labs Reviewed  WET PREP, GENITAL - Abnormal; Notable for the following:    Clue Cells Wet Prep HPF POC MODERATE (*)    WBC, Wet Prep HPF POC TOO NUMEROUS TO COUNT (*)    All other components within normal limits  CBC - Abnormal; Notable for the following:    Platelets 149 (*)    All other components within normal limits  URINALYSIS, ROUTINE W REFLEX MICROSCOPIC (NOT AT [facility]) - Abnormal; Notable for the following:    APPearance CLOUDY (*)    Ketones, ur 15 (*)    Leukocytes, UA SMALL (*)    All other components within normal limits  URINE MICROSCOPIC-ADD ON - Abnormal; Notable for the following:    Squamous Epithelial / LPF FEW (*)    All other components within normal limits  LIPASE, BLOOD  COMPREHENSIVE METABOLIC PANEL  I-STAT BETA HCG BLOOD, ED ([facility], [facility], [facility] ONLY)  GC/CHLAMYDIA PROBE AMP ([place]) NOT AT [facility]    Imaging Review US Transvaginal Non-ob  07/24/2015  CLINICAL DATA:  Left lower quadrant/ adnexal region tenderness for 2 days. EXAM: TRANSABDOMINAL AND TRANSVAGINAL ULTRASOUND OF PELVIS DOPPLER ULTRASOUND OF OVARIES TECHNIQUE: Both transabdominal and transvaginal ultrasound examinations of the pelvis were performed. Transabdominal technique was performed for global  imaging of the pelvis including uterus, ovaries, adnexal regions, and pelvic cul-de-sac. It was necessary to proceed with endovaginal exam following the transabdominal exam to visualize the endometrium and ovaries to better advantage. Color and duplex Doppler ultrasound was utilized to evaluate blood flow to the ovaries. COMPARISON:  None. FINDINGS: Uterus Measurements: 7.5 x 5.1 x 5.4 cm. No fibroids or other mass visualized. Endometrium Thickness: 14 mm.  No focal abnormality visualized. Right ovary Measurements: 4.3 x 2.2 x 2.5 cm. Probable complex cyst likely an involuting corpus luteum measuring 2.4 cm. Ovary otherwise unremarkable. No adnexal masses. Left ovary Measurements: 5.0 x 2.4 x 3.0 cm. Normal appearance/no adnexal mass. Pulsed Doppler evaluation of both ovaries demonstrates normal low-resistance arterial and venous waveforms. Other findings  No free fluid. IMPRESSION: Normal transabdominal and endovaginal pelvic ultrasound. No evidence of ovarian torsion. No findings to explain left lower quadrant/adnexal region pain. Electronically Signed   By: Amie Portland M.D.   On: 07/24/2015 18:38   US Pelvis Complete  07/24/2015  CLINICAL DATA:  Left lower quadrant/ adnexal region tenderness for 2 days. EXAM: TRANSABDOMINAL AND TRANSVAGINAL ULTRASOUND OF PELVIS DOPPLER ULTRASOUND OF OVARIES TECHNIQUE: Both transabdominal and transvaginal ultrasound examinations of the pelvis were performed. Transabdominal technique was performed for global imaging of the pelvis including uterus, ovaries, adnexal regions, and pelvic cul-de-sac. It was necessary to proceed with endovaginal exam following the transabdominal exam to visualize the endometrium and ovaries to better advantage. Color and duplex Doppler ultrasound was utilized to evaluate blood flow to the ovaries. COMPARISON:  None. FINDINGS: Uterus Measurements: 7.5 x 5.1 x 5.4 cm. No fibroids or other mass visualized. Endometrium Thickness: 14 mm.  No focal  abnormality visualized. Right ovary Measurements: 4.3 x 2.2 x 2.5 cm. Probable complex cyst likely an involuting corpus luteum measuring 2.4 cm. Ovary otherwise unremarkable. No adnexal masses. Left ovary Measurements: 5.0 x 2.4 x 3.0 cm. Normal appearance/no adnexal mass. Pulsed Doppler evaluation of both ovaries demonstrates normal low-resistance arterial and venous waveforms. Other findings No free fluid. IMPRESSION: Normal transabdominal and endovaginal pelvic ultrasound. No evidence of ovarian torsion. No findings to explain left lower quadrant/adnexal region pain. Electronically Signed   By: Amie Portland M.D.   On: 07/24/2015 18:38   [place] Art/ven Flow Abd Pelv Doppler  07/24/2015  CLINICAL DATA:  Left lower quadrant/ adnexal region tenderness for 2 days. EXAM: TRANSABDOMINAL AND TRANSVAGINAL ULTRASOUND OF PELVIS DOPPLER ULTRASOUND OF OVARIES TECHNIQUE: Both transabdominal and transvaginal ultrasound examinations of the pelvis were performed. Transabdominal technique was performed for global imaging of the pelvis including uterus, ovaries, adnexal regions, and pelvic cul-de-sac. It was necessary to proceed with endovaginal exam following the transabdominal exam to visualize the endometrium and ovaries to better advantage. Color and duplex Doppler ultrasound was utilized to evaluate blood flow to the ovaries. COMPARISON:  None. FINDINGS: Uterus Measurements: 7.5 x 5.1 x 5.4 cm. No fibroids or other mass visualized. Endometrium Thickness: 14 mm.  No focal abnormality visualized. Right ovary Measurements: 4.3 x 2.2 x 2.5 cm. Probable complex cyst likely an involuting corpus luteum measuring 2.4 cm. Ovary otherwise unremarkable. No adnexal masses. Left ovary Measurements: 5.0 x 2.4 x 3.0 cm. Normal appearance/no adnexal mass. Pulsed Doppler evaluation of both ovaries demonstrates normal low-resistance arterial and venous waveforms. Other findings No free fluid. IMPRESSION: Normal transabdominal and endovaginal  pelvic ultrasound. No evidence of ovarian torsion. No findings to explain left lower quadrant/adnexal region pain. Electronically Signed   By: Amie Portland M.D.   On: 07/24/2015 18:38   I have personally reviewed and evaluated these images and lab results as part of my medical decision-making.   EKG Interpretation None      MDM   Final diagnoses:  LLQ abdominal pain  BV (bacterial vaginosis)  Ovulation pain    34 y.o. female here with LLQ abd pain x1 month intermittently. Has had pain in the past similar to this. U/A contaminated here, doubt UTI. Labs unremarkable. Exam reveals mild LLQ tenderness, nonperitoneal. Pt states she doesn't have pain at this time aside from during palpation, declines pain meds. Will get pelvic exam and reassess. Doubt need for imaging, but will wait on pelvic to assess need.  5:25 PM L adnexal tenderness on exam, no discharge or CMT.  Will obtain pelvic U/S to assess L adnexal tenderness. Doubt need for empiric tx of GC/CT but will await wet prep results to see if this is necessary. Will reassess shortly.   6:54 PM Wet prep with moderate clue cells and TNTC WBC, will empirically cover for GC/CT given the WBC. Will treat for BV. U/S neg, no findings to explain pain. Could be from ovulation since there is a corpus luteum cyst on R ovary. Discussed tylenol/naprosyn for pain. F/up with PCP in 1wk. Safe sex discussed. I explained the diagnosis and have given explicit precautions to return to the ER including for any other new or worsening symptoms. The patient understands and accepts the medical plan as it's been dictated and I have answered their questions. Discharge instructions concerning home care and prescriptions have been given. The patient is STABLE and is discharged to home in good condition.  BP 113/63 mmHg  Pulse 60  Temp(Src) 98.6 F (37 C) (Oral)  Resp 16  Ht  (1.575 m)  Wt 134 lb 1.6 oz (60.827 kg)  BMI 24.52 kg/m2  SpO2 100%  LMP  07/07/2015  Meds ordered this encounter  Medications  . azithromycin (ZITHROMAX) tablet 1,000 mg    Sig:    And  . cefTRIAXone (ROCEPHIN) injection 250 mg    Sig:     Order Specific Question:  Antibiotic Indication:    Answer:  STD  . metroNIDAZOLE (FLAGYL) 500 MG tablet    Sig: Take 1 tablet (500 mg total) by mouth 2 (two) times daily. One po bid x 7 days    Dispense:  14 tablet    Refill:  0    Order Specific Question:  Supervising Provider    Answer:  Hyacinth Meeker, BRIAN [[id number]]  . naproxen (NAPROSYN) 500 MG tablet    Sig: Take 1 tablet (500 mg total) by mouth 2 (two) times daily as needed for mild pain, moderate pain or headache (TAKE WITH MEALS.).    Dispense:  20 tablet    Refill:  0    Order Specific Question:  Supervising Provider    Answer:  Eber Hong [[id number]]     Ceri Mayer Camprubi-Soms, PA-C 07/24/15 1901  Arby Barrette, MD 07/29/15 [phone]

## 2015-07-24 NOTE — ED Notes (Signed)
Patient transported to Ultrasound 

## 2015-07-24 NOTE — Discharge Instructions (Signed)
Your abdominal pain could be from ovulation pain. Your labs revealed bacterial vaginosis, take flagyl as directed, don't drink alcohol while taking that medication. You have been treated for gonorrhea and chlamydia in the ER but the hospital will call you if lab is positive. You were tested for HIV and Syphilis, and the hospital will call you if the lab is positive. Use naprosyn or tylenol as needed for pain. Follow up with your regular doctor in 1 week. Return to the ER for changes or worsening symptoms. Always use condoms to protect yourself during sexual intercourse.   Abdominal (belly) pain can be caused by many things. Your caregiver performed an examination and possibly ordered blood/urine tests and imaging (CT scan, x-rays, ultrasound). Many cases can be observed and treated at home after initial evaluation in the emergency department. Even though you are being discharged home, abdominal pain can be unpredictable. Therefore, you need a repeated exam if your pain does not resolve, returns, or worsens. Most patients with abdominal pain don't have to be admitted to the hospital or have surgery, but serious problems like appendicitis and gallbladder attacks can start out as nonspecific pain. Many abdominal conditions cannot be diagnosed in one visit, so follow-up evaluations are very important. SEEK IMMEDIATE MEDICAL ATTENTION IF YOU DEVELOP ANY OF THE FOLLOWING SYMPTOMS:  The pain does not go away or becomes severe.   A temperature above 101 develops.   Repeated vomiting occurs (multiple episodes).   The pain becomes localized to portions of the abdomen. The right side could possibly be appendicitis. In an adult, the left lower portion of the abdomen could be colitis or diverticulitis.   Blood is being passed in stools or vomit (bright red or black tarry stools).   Return also if you develop chest pain, difficulty breathing, dizziness or fainting, or become confused, poorly responsive, or  inconsolable (young children).  The constipation stays for more than 4 days.   There is belly (abdominal) or rectal pain.   You do not seem to be getting better.     Abdominal Pain, Adult Many things can cause belly (abdominal) pain. Most times, the belly pain is not dangerous. Many cases of belly pain can be watched and treated at home. HOME CARE   Do not take medicines that help you go poop (laxatives) unless told to by your doctor.  Only take medicine as told by your doctor.  Eat or drink as told by your doctor. Your doctor will tell you if you should be on a special diet. GET HELP IF:  You do not know what is causing your belly pain.  You have belly pain while you are sick to your stomach (nauseous) or have runny poop (diarrhea).  You have pain while you pee or poop.  Your belly pain wakes you up at night.  You have belly pain that gets worse or better when you eat.  You have belly pain that gets worse when you eat fatty foods.  You have a fever. GET HELP RIGHT AWAY IF:   The pain does not go away within 2 hours.  You keep throwing up (vomiting).  The pain changes and is only in the right or left part of the belly.  You have bloody or tarry looking poop. MAKE SURE YOU:   Understand these instructions.  Will watch your condition.  Will get help right away if you are not doing well or get worse.   This information is not intended to replace advice  given to you by your health care provider. Make sure you discuss any questions you have with your health care provider.   Document Released: 02/21/2008 Document Revised: 09/25/2014 Document Reviewed: 05/14/2013 Elsevier Interactive Patient Education 2016 Else[employer] Inc[employer]  Mittelschmerz Mittelschmerz is pain in the lower abdomen that is felt between periods.  The pain affects one side of the abdomen. The side that it affects may change from month to month.  The pain may be mild or severe.  The pain may last from  minutes to hours. It does not last longer than 1-2 days.  The pain can happen with nausea and light vaginal bleeding. Mittelschmerz is common among women. It is caused by the growth and release of an egg from an ovary, and it is a natural part of the ovulation cycle. It often happens about two weeks after a woman's period ends. HOME CARE INSTRUCTIONS Pay attention to any changes in your condition. Take these actions to help with your pain:  Try soaking in a hot bath.  Take over-the-counter and prescription medicines only as told by your health care provider.  Keep all follow-up visits as told by your health care provider. This is important. SEEK MEDICAL CARE IF:  You have very bad pain most months.  You have abdominal pain that lasts longer than 24 hours.  Your pain medicine is not helping.  You have a fever.  You have nausea or vomiting that will not go away.  You miss your period.  You have vaginal bleeding between your periods that is heavier than spotting.   This information is not intended to replace advice given to you by your health care provider. Make sure you discuss any questions you have with your health care provider.   Document Released: 08/25/2002 Document Revised: 05/26/2015 Document Reviewed: 11/30/2014 Elsevier Interactive Patient Education 2016 Else[employer] Inc.  Bacterial Vaginosis Bacterial vaginosis is a vaginal infection that occurs when the normal balance of bacteria in the vagina is disrupted. It results from an overgrowth of certain bacteria. This is the most common vaginal infection in women of childbearing age. Treatment is important to prevent complications, especially in pregnant women, as it can cause a premature delivery. CAUSES  Bacterial vaginosis is caused by an increase in harmful bacteria that are normally present in smaller amounts in the vagina. Several different kinds of bacteria can cause bacterial vaginosis. However, the reason that the  condition develops is not fully understood. RISK FACTORS Certain activities or behaviors can put you at an increased risk of developing bacterial vaginosis, including:  Having a new sex partner or multiple sex partners.  Douching.  Using an intrauterine device (IUD) for contraception. Women do not get bacterial vaginosis from toilet seats, bedding, swimming pools, or contact with objects around them. SIGNS AND SYMPTOMS  Some women with bacterial vaginosis have no signs or symptoms. Common symptoms include:  Grey vaginal discharge.  A fishlike odor with discharge, especially after sexual intercourse.  Itching or burning of the vagina and vulva.  Burning or pain with urination. DIAGNOSIS  Your health care provider will take a medical history and examine the vagina for signs of bacterial vaginosis. A sample of vaginal fluid may be taken. Your health care provider will look at this sample under a microscope to check for bacteria and abnormal cells. A vaginal pH test may also be done.  TREATMENT  Bacterial vaginosis may be treated with antibiotic medicines. These may be given in the form of a pill or a  vaginal cream. A second round of antibiotics may be prescribed if the condition comes back after treatment. Because bacterial vaginosis increases your risk for sexually transmitted diseases, getting treated can help reduce your risk for chlamydia, gonorrhea, HIV, and herpes. HOME CARE INSTRUCTIONS   Only take over-the-counter or prescription medicines as directed by your health care provider.  If antibiotic medicine was prescribed, take it as directed. Make sure you finish it even if you start to feel better.  Tell all sexual partners that you have a vaginal infection. They should see their health care provider and be treated if they have problems, such as a mild rash or itching.  During treatment, it is important that you follow these instructions:  Avoid sexual activity or use condoms  correctly.  Do not douche.  Avoid alcohol as directed by your health care provider.  Avoid breastfeeding as directed by your health care provider. SEEK MEDICAL CARE IF:   Your symptoms are not improving after 3 days of treatment.  You have increased discharge or pain.  You have a fever. MAKE SURE YOU:   Understand these instructions.  Will watch your condition.  Will get help right away if you are not doing well or get worse. FOR MORE INFORMATION  Centers for Disease Control and Prevention, Division of STD Prevention: [employer] American Sexual Health Association (ASHA): www.ashastd.org    This information is not intended to replace advice given to you by your health care provider. Make sure you discuss any questions you have with your health care provider.   Document Released: 09/04/2005 Document Revised: 09/25/2014 Document Reviewed: 04/16/2013 Elsevier Interactive Patient Education [employer]

## 2015-07-24 NOTE — ED Notes (Signed)
Pt reports left side abd pain x 2 days. Denies urinary symptoms and denies n/v/d.

## 2015-07-26 LAB — GC/CHLAMYDIA PROBE AMP (~~LOC~~) NOT AT ARMC
Chlamydia: NEGATIVE
Neisseria Gonorrhea: NEGATIVE

## 2016-01-05 ENCOUNTER — Encounter (HOSPITAL_COMMUNITY): Payer: Self-pay | Admitting: Emergency Medicine

## 2016-01-05 ENCOUNTER — Emergency Department (HOSPITAL_COMMUNITY)
Admission: EM | Admit: 2016-01-05 | Discharge: 2016-01-05 | Disposition: A | Discharge status: Home / Self Care | Payer: BC Managed Care – PPO | Attending: Emergency Medicine | Admitting: Emergency Medicine

## 2016-01-05 DIAGNOSIS — Z79899 Other long term (current) drug therapy: Secondary | ICD-10-CM | POA: Insufficient documentation

## 2016-01-05 DIAGNOSIS — Z3202 Encounter for pregnancy test, result negative: Secondary | ICD-10-CM | POA: Insufficient documentation

## 2016-01-05 DIAGNOSIS — G43909 Migraine, unspecified, not intractable, without status migrainosus: Secondary | ICD-10-CM | POA: Insufficient documentation

## 2016-01-05 DIAGNOSIS — N390 Urinary tract infection, site not specified: Secondary | ICD-10-CM

## 2016-01-05 DIAGNOSIS — R1110 Vomiting, unspecified: Secondary | ICD-10-CM

## 2016-01-05 DIAGNOSIS — M791 Myalgia: Secondary | ICD-10-CM | POA: Insufficient documentation

## 2016-01-05 DIAGNOSIS — R109 Unspecified abdominal pain: Secondary | ICD-10-CM | POA: Diagnosis present

## 2016-01-05 DIAGNOSIS — R197 Diarrhea, unspecified: Secondary | ICD-10-CM | POA: Diagnosis not present

## 2016-01-05 LAB — CBC
HCT: 34.6 % — ABNORMAL LOW (ref 36.0–46.0)
HEMOGLOBIN: 11.5 g/dL — AB (ref 12.0–15.0)
MCH: 27.4 pg (ref 26.0–34.0)
MCHC: 33.2 g/dL (ref 30.0–36.0)
MCV: 82.6 fL (ref 78.0–100.0)
PLATELETS: 167 10*3/uL (ref 150–400)
RBC: 4.19 MIL/uL (ref 3.87–5.11)
RDW: 14.3 % (ref 11.5–15.5)
WBC: 5.3 10*3/uL (ref 4.0–10.5)

## 2016-01-05 LAB — URINALYSIS, ROUTINE W REFLEX MICROSCOPIC
Bilirubin Urine: NEGATIVE
Glucose, UA: NEGATIVE mg/dL
Ketones, ur: NEGATIVE mg/dL
NITRITE: NEGATIVE
PROTEIN: NEGATIVE mg/dL
SPECIFIC GRAVITY, URINE: 1.02 (ref 1.005–1.030)
pH: 5.5 (ref 5.0–8.0)

## 2016-01-05 LAB — COMPREHENSIVE METABOLIC PANEL
ALBUMIN: 3.9 g/dL (ref 3.5–5.0)
ALT: 18 U/L (ref 14–54)
AST: 23 U/L (ref 15–41)
Alkaline Phosphatase: 41 U/L (ref 38–126)
Anion gap: 9 (ref 5–15)
BUN: 7 mg/dL (ref 6–20)
CHLORIDE: 107 mmol/L (ref 101–111)
CO2: 24 mmol/L (ref 22–32)
Calcium: 9.5 mg/dL (ref 8.9–10.3)
Creatinine, Ser: 0.94 mg/dL (ref 0.44–1.00)
GFR calc Af Amer: >60 mL/min (ref >60)
GFR calc non Af Amer: >60 mL/min (ref >60)
GLUCOSE: 100 mg/dL — AB (ref 65–99)
POTASSIUM: 4 mmol/L (ref 3.5–5.1)
Sodium: 140 mmol/L (ref 135–145)
Total Bilirubin: 0.6 mg/dL (ref 0.3–1.2)
Total Protein: 7 g/dL (ref 6.5–8.1)

## 2016-01-05 LAB — LIPASE, BLOOD: LIPASE: 24 U/L (ref 11–51)

## 2016-01-05 LAB — URINE MICROSCOPIC-ADD ON

## 2016-01-05 LAB — I-STAT BETA HCG BLOOD, ED (MC, WL, AP ONLY)

## 2016-01-05 MED ORDER — CEPHALEXIN 500 MG PO CAPS: 500.0000 mg | ORAL_CAPSULE | Freq: Two times a day (BID) | ORAL | Status: DC

## 2016-01-05 MED ORDER — ONDANSETRON 8 MG PO TBDP: ORAL_TABLET | ORAL | Status: DC

## 2016-01-05 MED ORDER — ONDANSETRON 4 MG PO TBDP
8.0000 mg | ORAL_TABLET | Freq: Once | ORAL | Status: AC
  Administered 2016-01-05: 8 mg via ORAL
  Filled 2016-01-05: qty 2

## 2016-01-05 NOTE — ED Notes (Signed)
Pt sts N/V/D x 2 days with body aches  

## 2016-01-05 NOTE — ED Provider Notes (Signed)
CSN: [id number]     Arrival date & time 01/05/16  [id number] History   First MD Initiated Contact with Patient 01/05/16 [phone]     Chief Complaint  Patient presents with  . Nausea  . Diarrhea    Patient is a 35 y.o. female presenting with diarrhea. The history is provided by the patient.  Diarrhea Quality:  Watery Severity:  Moderate Onset quality:  Gradual Duration:  2 days Timing:  Intermittent Progression:  Unchanged Relieved by:  Nothing Worsened by:  Nothing tried Associated symptoms: abdominal pain, fever and myalgias   Risk factors: no recent antibiotic use and no travel to endemic areas   Patient with h/o migraines who presents with vomiting/diarrhea for past 2 days She reports she has had fever up to 100.8 She reports abd pain/cramping with episodes of diarrhea No blood in vomit/stool No travel No antibiotics   Past Medical History  Diagnosis Date  . Migraine   . Blurred vision    Past Surgical History  Procedure Laterality Date  . Tubal ligation  2006  . Cholecystectomy  2007   Family History  Problem Relation Age of Onset  . Diabetes Maternal Grandmother     some uncles as well   . High blood pressure Maternal Grandmother     some uncles as well  . Heart attack Father   . Migraines Neg Hx    Social History  Substance Use Topics  . Smoking status: Never Smoker   . Smokeless tobacco: Never Used  . Alcohol Use: No   OB History    No data available     Review of Systems  Constitutional: Positive for fever.  Respiratory: Negative for cough.   Gastrointestinal: Positive for abdominal pain and diarrhea. Negative for blood in stool.  Genitourinary: Negative for dysuria.  Musculoskeletal: Positive for myalgias.  All other systems reviewed and are negative.     Allergies  Nyquil multi-symptom and Shellfish allergy  Home Medications   Prior to Admission medications   Medication Sig Start Date End Date Taking? Authorizing Provider  acetaminophen  (TYLENOL) 500 MG tablet Take 500 mg by mouth every 6 (six) hours as needed (pain).    Historical Provider, MD  Cholecalciferol (VITAMIN D PO) Take 1 tablet by mouth at bedtime.    Historical Provider, MD  Cyanocobalamin (B-12 PO) Take 1 tablet by mouth at bedtime.     Historical Provider, MD  ferrous sulfate 325 (65 FE) MG tablet Take 325 mg by mouth at bedtime.     Historical Provider, MD  ibuprofen (ADVIL,MOTRIN) 800 MG tablet Take 1 tablet (800 mg total) by mouth 3 (three) times daily as needed for cramping. Patient not taking: Reported on 07/24/2015 10/13/14   Paula LibraJohn Molpus, MD  metroNIDAZOLE (FLAGYL) 500 MG tablet Take 1 tablet (500 mg total) by mouth 2 (two) times daily. Patient not taking: Reported on 10/12/2014 10/09/14   Charm RingsErin J Honig, MD  metroNIDAZOLE (FLAGYL) 500 MG tablet Take 1 tablet (500 mg total) by mouth 2 (two) times daily. One po bid x 7 days 07/24/15   Mercedes Camprubi-Soms, PA-C  Multiple Vitamin (MULTIVITAMIN WITH MINERALS) TABS tablet Take 1 tablet by mouth at bedtime.     Historical Provider, MD  naproxen (NAPROSYN) 500 MG tablet Take 1 tablet (500 mg total) by mouth 2 (two) times daily as needed for mild pain, moderate pain or headache (TAKE WITH MEALS.). 07/24/15   Mercedes Camprubi-Soms, PA-C  ondansetron (ZOFRAN ODT) 8 MG disintegrating tablet Take 1  tablet (8 mg total) by mouth every 8 (eight) hours as needed for nausea or vomiting.  ODT q4 hours prn nausea Patient not taking: Reported on 07/24/2015 10/13/14   Paula Libra, MD  SUMAtriptan (IMITREX) 20 MG/ACT nasal spray Place 1 spray (20 mg total) into the nose every 2 (two) hours as needed for migraine or headache. May repeat in 2 hours if headache persists or recurs. Do not use more than 2x a day and 2-3 times a week. Patient not taking: Reported on 07/24/2015 09/08/14   Anson Fret, MD  SUMAtriptan (IMITREX) 25 MG tablet Take 25 mg by mouth every 2 (two) hours as needed for migraine or headache. May repeat in 2 hours if  headache persists or recurs.    Historical Provider, MD  topiramate (TOPAMAX) 25 MG tablet Take 2 tablets (50 mg total) by mouth 2 (two) times daily. Patient not taking: Reported on 07/24/2015 09/08/14   Anson Fret, MD  valACYclovir (VALTREX) 500 MG tablet Take 1 tablet (500 mg total) by mouth 2 (two) times daily. For 3 days at start of outbreak Patient not taking: Reported on 07/24/2015 10/04/14   Charm Rings, MD   BP 111/69 mmHg  Pulse 63  Temp(Src) 97.6 F (36.4 C) (Oral)  Resp 18  SpO2 100% Physical Exam CONSTITUTIONAL: Well developed/well nourished HEAD: Normocephalic/atraumatic EYES: EOMI/PERRL, no icterus ENMT: Mucous membranes moist NECK: supple no meningeal signs CV: S1/S2 noted, no murmurs/rubs/gallops noted LUNGS: Lungs are clear to auscultation bilaterally, no apparent distress ABDOMEN: soft, nontender, no rebound or guarding, bowel sounds noted throughout abdomen GU:no cva tenderness NEURO: Pt is awake/alert/appropriate, moves all extremitiesx4.  No facial droop.   EXTREMITIES: pulses normal/equal, full ROM SKIN: warm, color normal PSYCH: no abnormalities of mood noted, alert and oriented to situation  ED Course  Procedures  Labs Review Labs Reviewed  COMPREHENSIVE METABOLIC PANEL - Abnormal; Notable for the following:    Glucose, Bld 100 (*)    All other components within normal limits  CBC - Abnormal; Notable for the following:    Hemoglobin 11.5 (*)    HCT 34.6 (*)    All other components within normal limits  URINALYSIS, ROUTINE W REFLEX MICROSCOPIC (NOT AT [facility]) - Abnormal; Notable for the following:    APPearance TURBID (*)    Hgb urine dipstick SMALL (*)    Leukocytes, UA LARGE (*)    All other components within normal limits  URINE MICROSCOPIC-ADD ON - Abnormal; Notable for the following:    Squamous Epithelial / LPF 6-30 (*)    Bacteria, UA MANY (*)    All other components within normal limits  LIPASE, BLOOD  I-STAT BETA HCG BLOOD, ED ([facility], [facility],  [facility] ONLY)    I have personally reviewed and evaluated these  lab results as part of my medical decision-making.  10:05 AM Pt with probable viral illness Will give PO challenge abd soft without focal tenderness She does not appear dehydrated 11:15 AM Pt improved No vomiting UTI noted Will give keflex Discussed return precautions   MDM   Final diagnoses:  UTI (lower urinary tract infection)  Vomiting and diarrhea    Nursing notes including past medical history and social history reviewed and considered in documentation Labs/vital reviewed myself and considered during evaluation     Zadie Rhine, MD 01/05/16 1116

## 2016-03-15 ENCOUNTER — Ambulatory Visit (HOSPITAL_COMMUNITY)
Admission: EM | Admit: 2016-03-15 | Discharge: 2016-03-15 | Disposition: A | Discharge status: Home / Self Care | Payer: Managed Care, Other (non HMO) | Attending: Family Medicine | Admitting: Family Medicine

## 2016-03-15 ENCOUNTER — Encounter (HOSPITAL_COMMUNITY): Payer: Self-pay

## 2016-03-15 DIAGNOSIS — G43109 Migraine with aura, not intractable, without status migrainosus: Secondary | ICD-10-CM

## 2016-03-15 MED ORDER — KETOROLAC TROMETHAMINE 30 MG/ML IJ SOLN
INTRAMUSCULAR | Status: AC
  Filled 2016-03-15: qty 1

## 2016-03-15 MED ORDER — METHYLPREDNISOLONE ACETATE 40 MG/ML IJ SUSP
80.0000 mg | Freq: Once | INTRAMUSCULAR | Status: AC
  Administered 2016-03-15: 80 mg via INTRAMUSCULAR

## 2016-03-15 MED ORDER — DIPHENHYDRAMINE HCL 50 MG/ML IJ SOLN
50.0000 mg | Freq: Once | INTRAMUSCULAR | Status: AC
  Administered 2016-03-15: 50 mg via INTRAMUSCULAR

## 2016-03-15 MED ORDER — DIPHENHYDRAMINE HCL 50 MG/ML IJ SOLN
INTRAMUSCULAR | Status: AC
  Filled 2016-03-15: qty 1

## 2016-03-15 MED ORDER — KETOROLAC TROMETHAMINE 30 MG/ML IJ SOLN
30.0000 mg | Freq: Once | INTRAMUSCULAR | Status: AC
  Administered 2016-03-15: 30 mg via INTRAMUSCULAR

## 2016-03-15 MED ORDER — DIPHENHYDRAMINE HCL 50 MG/ML IJ SOLN: 50.0000 mg | Freq: Once | INTRAMUSCULAR | Status: DC

## 2016-03-15 MED ORDER — METHYLPREDNISOLONE ACETATE 80 MG/ML IJ SUSP
INTRAMUSCULAR | Status: AC
  Filled 2016-03-15: qty 1

## 2016-03-15 NOTE — ED Notes (Signed)
Patient presents with Migraine x3 days, pt has hx of migraine and she takes Imitrex 25 mg but pt states medication is not working  No acute distress

## 2016-03-15 NOTE — ED Provider Notes (Signed)
CSN: [id number]     Arrival date & time 03/15/16  1449 History   First MD Initiated Contact with Patient 03/15/16 1547     Chief Complaint  Patient presents with  . Migraine   (Consider location/radiation/quality/duration/timing/severity/associated sxs/prior Treatment) Patient is a 35 y.o. female presenting with migraines. The history is provided by the patient.  Migraine This is a chronic problem. The current episode started more than 2 days ago. The problem has not changed since onset.Associated symptoms include headaches. Associated symptoms comments: Has an aura and premenstraul sx, not responding to imitrex med, this sometimes happens according to pt., sl nausea, no fever or illness sx..    Past Medical History  Diagnosis Date  . Migraine   . Blurred vision    Past Surgical History  Procedure Laterality Date  . Tubal ligation  2006  . Cholecystectomy  2007   Family History  Problem Relation Age of Onset  . Diabetes Maternal Grandmother     some uncles as well   . High blood pressure Maternal Grandmother     some uncles as well  . Heart attack Father   . Migraines Neg Hx    Social History  Substance Use Topics  . Smoking status: Never Smoker   . Smokeless tobacco: Never Used  . Alcohol Use: No   OB History    No data available     Review of Systems  Constitutional: Negative.   Respiratory: Negative.   Cardiovascular: Negative.   Genitourinary: Negative.   Neurological: Positive for headaches. Negative for dizziness, speech difficulty, light-headedness and numbness.  All other systems reviewed and are negative.   Allergies  Nyquil multi-symptom and Shellfish allergy  Home Medications   Prior to Admission medications   Medication Sig Start Date End Date Taking? Authorizing Provider  naproxen (NAPROSYN) 500 MG tablet Take 1 tablet (500 mg total) by mouth 2 (two) times daily as needed for mild pain, moderate pain or headache (TAKE WITH MEALS.). 07/24/15  Yes  Mercedes Camprubi-Soms, PA-C  SUMAtriptan (IMITREX) 25 MG tablet Take 25 mg by mouth every 2 (two) hours as needed for migraine or headache. May repeat in 2 hours if headache persists or recurs.   Yes Historical Provider, MD  acetaminophen (TYLENOL) 500 MG tablet Take 500 mg by mouth every 6 (six) hours as needed (pain).    Historical Provider, MD  cephALEXin (KEFLEX) 500 MG capsule Take 1 capsule (500 mg total) by mouth 2 (two) times daily. 01/05/16   Zadie Rhineonald Wickline, MD  Cholecalciferol (VITAMIN D PO) Take 1 tablet by mouth at bedtime.    Historical Provider, MD  Cyanocobalamin (B-12 PO) Take 1 tablet by mouth at bedtime.     Historical Provider, MD  ferrous sulfate 325 (65 FE) MG tablet Take 325 mg by mouth at bedtime.     Historical Provider, MD  ibuprofen (ADVIL,MOTRIN) 800 MG tablet Take 1 tablet (800 mg total) by mouth 3 (three) times daily as needed for cramping. 10/13/14   Paula LibraJohn Molpus, MD  Multiple Vitamin (MULTIVITAMIN WITH MINERALS) TABS tablet Take 1 tablet by mouth at bedtime.     Historical Provider, MD  ondansetron (ZOFRAN ODT) 8 MG disintegrating tablet 8mg  ODT q4 hours prn nausea 01/05/16   Zadie Rhineonald Wickline, MD   Meds Ordered and Administered this Visit   Medications  ketorolac (TORADOL) 30 MG/ML injection 30 mg (not administered)  methylPREDNISolone acetate (DEPO-MEDROL) injection 80 mg (not administered)  diphenhydrAMINE (BENADRYL) injection 50 mg (not administered)  BP 112/75 mmHg  Pulse 77  Temp(Src) 98.3 F (36.8 C) (Oral)  Resp 16  SpO2 100%  LMP 03/12/2016 (Exact Date) No data found.   Physical Exam  Constitutional: She is oriented to person, place, and time. She appears well-developed and well-nourished. No distress.  HENT:  Head: Normocephalic.  Neck: Normal range of motion. Neck supple.  Lymphadenopathy:    She has no cervical adenopathy.  Neurological: She is alert and oriented to person, place, and time. No cranial nerve deficit. Coordination normal.   Skin: Skin is warm and dry.  Nursing note and vitals reviewed.   ED Course  Procedures (including critical care time)  Labs Review Labs Reviewed - No data to display  Imaging Review No results found.   Visual Acuity Review  Right Eye Distance:   Left Eye Distance:   Bilateral Distance:    Right Eye Near:   Left Eye Near:    Bilateral Near:         MDM   1. Migraine with aura and without status migrainosus, not intractable        Linna HoffJames D Kiaya Haliburton, MD 03/15/16 [phone]

## 2016-03-15 NOTE — Discharge Instructions (Signed)
Home to rest, see your doctor if problems.

## 2016-03-16 ENCOUNTER — Telehealth (HOSPITAL_COMMUNITY): Payer: Self-pay | Admitting: Emergency Medicine

## 2016-03-16 NOTE — ED Notes (Signed)
Pt called needing work note extension.... Per Jamse MeadFrank P, PA... Ok to extend one more day Note was signed by Homero FellersFrank and left at the front office.

## 2016-06-27 ENCOUNTER — Encounter (HOSPITAL_COMMUNITY): Payer: Self-pay | Admitting: Emergency Medicine

## 2016-06-27 ENCOUNTER — Ambulatory Visit (HOSPITAL_COMMUNITY)
Admission: EM | Admit: 2016-06-27 | Discharge: 2016-06-27 | Disposition: A | Discharge status: Home / Self Care | Payer: Medicaid Other | Attending: Physician Assistant | Admitting: Physician Assistant

## 2016-06-27 DIAGNOSIS — G43909 Migraine, unspecified, not intractable, without status migrainosus: Secondary | ICD-10-CM

## 2016-06-27 MED ORDER — KETOROLAC TROMETHAMINE 30 MG/ML IJ SOLN
INTRAMUSCULAR | Status: AC
  Filled 2016-06-27: qty 1

## 2016-06-27 MED ORDER — DIPHENHYDRAMINE HCL 50 MG/ML IJ SOLN
INTRAMUSCULAR | Status: AC
  Filled 2016-06-27: qty 1

## 2016-06-27 MED ORDER — DEXAMETHASONE SODIUM PHOSPHATE 10 MG/ML IJ SOLN
10.0000 mg | Freq: Once | INTRAMUSCULAR | Status: AC
  Administered 2016-06-27: 10 mg via INTRAMUSCULAR

## 2016-06-27 MED ORDER — DIPHENHYDRAMINE HCL 50 MG/ML IJ SOLN
25.0000 mg | Freq: Once | INTRAMUSCULAR | Status: AC
  Administered 2016-06-27: 25 mg via INTRAMUSCULAR

## 2016-06-27 MED ORDER — SUMATRIPTAN SUCCINATE 25 MG PO TABS: ORAL_TABLET | ORAL | 2 refills | Status: DC

## 2016-06-27 MED ORDER — KETOROLAC TROMETHAMINE 30 MG/ML IJ SOLN
30.0000 mg | Freq: Once | INTRAMUSCULAR | Status: AC
  Administered 2016-06-27: 30 mg via INTRAMUSCULAR

## 2016-06-27 MED ORDER — DEXAMETHASONE SODIUM PHOSPHATE 10 MG/ML IJ SOLN
INTRAMUSCULAR | Status: AC
  Filled 2016-06-27: qty 1

## 2016-06-27 NOTE — ED Triage Notes (Signed)
Patient reports facial pain and pressure, sinus issues started on Friday.  Sunday. Patient reports getting a headache that is typical of her migraines.  Patient ran out of Imitrex medication.  Patient has photo-sensitivity, head hurts on right side of face.  Patient reports this is typical "migraine" pain

## 2016-06-27 NOTE — ED Provider Notes (Signed)
CSN: [id number]     Arrival date & time 06/27/16  1417 History   First MD Initiated Contact with Patient 06/27/16 1443     Chief Complaint  Patient presents with  . Headache   (Consider location/radiation/quality/duration/timing/severity/associated sxs/prior Treatment) HPI New problem Onset migraine 3-4 days ago. Has persisted despite OTC meds. Out of imitrex. Recent change in jobs. No insurance until November. No aura, occasional nausea. Pain score 8 Past Medical History:  Diagnosis Date  . Blurred vision   . Migraine    Past Surgical History:  Procedure Laterality Date  . CHOLECYSTECTOMY  2007  . TUBAL LIGATION  2006   Family History  Problem Relation Age of Onset  . Diabetes Maternal Grandmother     some uncles as well   . High blood pressure Maternal Grandmother     some uncles as well  . Heart attack Father   . Migraines Neg Hx    Social History  Substance Use Topics  . Smoking status: Never Smoker  . Smokeless tobacco: Never Used  . Alcohol use No   OB History    No data available     Review of Systems  Denies: NAUSEA, ABDOMINAL PAIN, CHEST PAIN, CONGESTION, DYSURIA, SHORTNESS OF BREATH  Allergies  Nyquil multi-symptom [pseudoeph-doxylamine-dm-apap] and Shellfish allergy  Home Medications   Prior to Admission medications   Medication Sig Start Date End Date Taking? Authorizing Provider  acetaminophen (TYLENOL) 500 MG tablet Take 500 mg by mouth every 6 (six) hours as needed (pain).    Historical Provider, MD  cephALEXin (KEFLEX) 500 MG capsule Take 1 capsule (500 mg total) by mouth 2 (two) times daily. 01/05/16   Zadie Rhine, MD  Cholecalciferol (VITAMIN D PO) Take 1 tablet by mouth at bedtime.    Historical Provider, MD  Cyanocobalamin (B-12 PO) Take 1 tablet by mouth at bedtime.     Historical Provider, MD  ferrous sulfate 325 (65 FE) MG tablet Take 325 mg by mouth at bedtime.     Historical Provider, MD  ibuprofen (ADVIL,MOTRIN) 800 MG tablet Take 1  tablet (800 mg total) by mouth 3 (three) times daily as needed for cramping. 10/13/14   Paula Libra, MD  Multiple Vitamin (MULTIVITAMIN WITH MINERALS) TABS tablet Take 1 tablet by mouth at bedtime.     Historical Provider, MD  naproxen (NAPROSYN) 500 MG tablet Take 1 tablet (500 mg total) by mouth 2 (two) times daily as needed for mild pain, moderate pain or headache (TAKE WITH MEALS.). 07/24/15   Mercedes Camprubi-Soms, PA-C  ondansetron (ZOFRAN ODT) 8 MG disintegrating tablet 8mg  ODT q4 hours prn nausea 01/05/16   Zadie Rhine, MD  SUMAtriptan (IMITREX) 25 MG tablet Take 25 mg by mouth every 2 (two) hours as needed for migraine or headache. May repeat in 2 hours if headache persists or recurs.    Historical Provider, MD   Meds Ordered and Administered this Visit  Medications - No data to display  BP 103/76 (BP Location: Right Arm)   Pulse 84   Temp 98.9 F (37.2 C) (Oral)   Resp 16   SpO2 98%  No data found.   Physical Exam NURSES NOTES AND VITAL SIGNS REVIEWED. CONSTITUTIONAL: Well developed, well nourished, no acute distress HEENT: normocephalic, atraumatic EYES: Conjunctiva normal NECK:normal ROM, supple, no adenopathy PULMONARY:No respiratory distress, normal effort ABDOMINAL: Soft, ND, NT BS+, No CVAT MUSCULOSKELETAL: Normal ROM of all extremities,  SKIN: warm and dry without rash PSYCHIATRIC: Mood and affect, behavior are normal  NEUROLOGICAL SCREENING EXAM: Constitutional:  oriented to person, place, and time.  Neurological:  .  normal strength and normal reflexes. No cranial nerve deficit or sensory deficit. negative Romberg sign. GCS eye subscore is 4. GCS verbal subscore is 5. GCS motor subscore is 6.    Urgent Care Course   Clinical Course   Feels much better after h/a cocktail. Pain score now 3/  Procedures (including critical care time)  Labs Review Labs Reviewed - No data to display  Imaging Review No results found.   Visual Acuity Review  Right Eye  Distance:   Left Eye Distance:   Bilateral Distance:    Right Eye Near:   Left Eye Near:    Bilateral Near:         MDM   1. Migraine without status migrainosus, not intractable, unspecified migraine type     Patient is reassured that there are no issues that require transfer to higher level of care at this time or additional tests. Patient is advised to continue home symptomatic treatment. Patient is advised that if there are new or worsening symptoms to attend the emergency department, contact primary care provider, or return to UC. Instructions of care provided discharged home in stable condition.    THIS NOTE WAS GENERATED USING A VOICE RECOGNITION SOFTWARE PROGRAM. ALL REASONABLE EFFORTS  WERE MADE TO PROOFREAD THIS DOCUMENT FOR ACCURACY.  I have verbally reviewed the discharge instructions with the patient. A printed AVS was given to the patient.  All questions were answered prior to discharge.      Tharon AquasFrank C Gelila Well, PA 06/27/16 [phone]

## 2016-06-29 ENCOUNTER — Emergency Department (HOSPITAL_COMMUNITY)
Admission: EM | Admit: 2016-06-29 | Discharge: 2016-06-29 | Disposition: A | Discharge status: Home / Self Care | Payer: Medicaid Other | Attending: Emergency Medicine | Admitting: Emergency Medicine

## 2016-06-29 ENCOUNTER — Encounter (HOSPITAL_COMMUNITY): Payer: Self-pay

## 2016-06-29 DIAGNOSIS — Z79899 Other long term (current) drug therapy: Secondary | ICD-10-CM | POA: Diagnosis not present

## 2016-06-29 DIAGNOSIS — G43909 Migraine, unspecified, not intractable, without status migrainosus: Secondary | ICD-10-CM

## 2016-06-29 DIAGNOSIS — R51 Headache: Secondary | ICD-10-CM | POA: Diagnosis present

## 2016-06-29 LAB — COMPREHENSIVE METABOLIC PANEL
ALT: 13 U/L — ABNORMAL LOW (ref 14–54)
ANION GAP: 5 (ref 5–15)
AST: 20 U/L (ref 15–41)
Albumin: 3.7 g/dL (ref 3.5–5.0)
Alkaline Phosphatase: 42 U/L (ref 38–126)
BUN: 7 mg/dL (ref 6–20)
CALCIUM: 9.4 mg/dL (ref 8.9–10.3)
CHLORIDE: 110 mmol/L (ref 101–111)
CO2: 24 mmol/L (ref 22–32)
Creatinine, Ser: 1.04 mg/dL — ABNORMAL HIGH (ref 0.44–1.00)
GFR calc non Af Amer: >60 mL/min (ref >60)
Glucose, Bld: 95 mg/dL (ref 65–99)
POTASSIUM: 3.7 mmol/L (ref 3.5–5.1)
SODIUM: 139 mmol/L (ref 135–145)
Total Bilirubin: 0.5 mg/dL (ref 0.3–1.2)
Total Protein: 6.2 g/dL — ABNORMAL LOW (ref 6.5–8.1)

## 2016-06-29 LAB — CBC WITH DIFFERENTIAL/PLATELET
BASOS PCT: 0 %
Basophils Absolute: 0 10*3/uL (ref 0.0–0.1)
Eosinophils Absolute: 0 10*3/uL (ref 0.0–0.7)
Eosinophils Relative: 0 %
HEMATOCRIT: 33.8 % — AB (ref 36.0–46.0)
HEMOGLOBIN: 10.9 g/dL — AB (ref 12.0–15.0)
LYMPHS ABS: 3.2 10*3/uL (ref 0.7–4.0)
LYMPHS PCT: 41 %
MCH: 26.2 pg (ref 26.0–34.0)
MCHC: 32.2 g/dL (ref 30.0–36.0)
MCV: 81.3 fL (ref 78.0–100.0)
MONOS PCT: 8 %
Monocytes Absolute: 0.6 10*3/uL (ref 0.1–1.0)
NEUTROS ABS: 4 10*3/uL (ref 1.7–7.7)
NEUTROS PCT: 51 %
Platelets: 127 10*3/uL — ABNORMAL LOW (ref 150–400)
RBC: 4.16 MIL/uL (ref 3.87–5.11)
RDW: 14.6 % (ref 11.5–15.5)
WBC: 7.9 10*3/uL (ref 4.0–10.5)

## 2016-06-29 LAB — URINE MICROSCOPIC-ADD ON

## 2016-06-29 LAB — URINALYSIS, ROUTINE W REFLEX MICROSCOPIC
Bilirubin Urine: NEGATIVE
Glucose, UA: NEGATIVE mg/dL
Hgb urine dipstick: NEGATIVE
KETONES UR: 15 mg/dL — AB
NITRITE: NEGATIVE
PROTEIN: NEGATIVE mg/dL
Specific Gravity, Urine: 1.027 (ref 1.005–1.030)
pH: 5.5 (ref 5.0–8.0)

## 2016-06-29 LAB — LIPASE, BLOOD: Lipase: 25 U/L (ref 11–51)

## 2016-06-29 LAB — PREGNANCY, URINE: Preg Test, Ur: NEGATIVE

## 2016-06-29 MED ORDER — KETOROLAC TROMETHAMINE 30 MG/ML IJ SOLN
60.0000 mg | Freq: Once | INTRAMUSCULAR | Status: AC
  Administered 2016-06-29: 60 mg via INTRAMUSCULAR
  Filled 2016-06-29: qty 2

## 2016-06-29 MED ORDER — METOCLOPRAMIDE HCL 5 MG/ML IJ SOLN
10.0000 mg | Freq: Once | INTRAMUSCULAR | Status: AC
  Administered 2016-06-29: 10 mg via INTRAMUSCULAR
  Filled 2016-06-29: qty 2

## 2016-06-29 MED ORDER — DIPHENHYDRAMINE HCL 50 MG/ML IJ SOLN
25.0000 mg | Freq: Once | INTRAMUSCULAR | Status: AC
  Administered 2016-06-29: 25 mg via INTRAMUSCULAR
  Filled 2016-06-29: qty 1

## 2016-06-29 NOTE — ED Triage Notes (Signed)
Per Pt: She has a headache that is located on the right side of her head. She states that she is having abdominal pain, she states "It's the lower bottom that hurts". Pt states that she did not take any medication for the discomfort today. Pt denies any vomiting or diarrhea.

## 2016-06-29 NOTE — ED Triage Notes (Signed)
Pt states "My stomach started hurting last month. My head started hurting on Saturday".

## 2016-06-29 NOTE — ED Provider Notes (Signed)
[facility]-EMERGENCY DEPT Provider Note   CSN: [id number] Arrival date & time: 06/29/16  0805     History   Chief Complaint Chief Complaint  Patient presents with  . Abdominal Pain  . Headache    HPI Brittney Watkins is a 35 y.o. female. She has a long history of migraine headaches. She has a history of recurrent pelvic pain. States that she had a migraine started Saturday. Was at urgent care on Monday and got "What to you call it, the cocktail?".  Says it helped her headache. Headache recurred yesterday last night. Nausea but no vomiting. Throbbing left-sided headache. Not atypical for her. States she had irregular periods last month where she had bleeding for a few days, a normal week, then some additional spotting. No bleeding since that time. History of tubal ligation. No new sexual partners. No dyspareunia. No urinary symptoms. No discharge. HPI  Past Medical History:  Diagnosis Date  . Blurred vision   . Migraine     Patient Active Problem List   Diagnosis Date Noted  . Migraine 09/08/2014  . Headache 09/08/2014  . Neck pain 09/08/2014    Past Surgical History:  Procedure Laterality Date  . CHOLECYSTECTOMY  2007  . TUBAL LIGATION  2006    OB History    No data available       Home Medications    Prior to Admission medications   Medication Sig Start Date End Date Taking? Authorizing Provider  Multiple Vitamin (MULTIVITAMIN WITH MINERALS) TABS tablet Take 1 tablet by mouth at bedtime.    Yes Historical Provider, MD  ibuprofen (ADVIL,MOTRIN) 800 MG tablet Take 1 tablet (800 mg total) by mouth 3 (three) times daily as needed for cramping. Patient not taking: Reported on 06/29/2016 10/13/14   Paula Libra, MD  naproxen (NAPROSYN) 500 MG tablet Take 1 tablet (500 mg total) by mouth 2 (two) times daily as needed for mild pain, moderate pain or headache (TAKE WITH MEALS.). Patient not taking: Reported on 06/29/2016 07/24/15   Mercedes Camprubi-Soms, PA-C  ondansetron  (ZOFRAN ODT) 8 MG disintegrating tablet 8mg  ODT q4 hours prn nausea Patient not taking: Reported on 06/29/2016 01/05/16   Zadie Rhine, MD  SUMAtriptan (IMITREX) 25 MG tablet May repeat in 2 hours if headache persists or recurs. Patient not taking: Reported on 06/29/2016 06/27/16   Tharon Aquas, PA    Family History Family History  Problem Relation Age of Onset  . Heart attack Father   . Diabetes Maternal Grandmother     some uncles as well   . High blood pressure Maternal Grandmother     some uncles as well  . Migraines Neg Hx     Social History Social History  Substance Use Topics  . Smoking status: Never Smoker  . Smokeless tobacco: Never Used  . Alcohol use No     Allergies   Nyquil multi-symptom [pseudoeph-doxylamine-dm-apap] and Shellfish allergy   Review of Systems Review of Systems  Constitutional: Negative for appetite change, chills, diaphoresis, fatigue and fever.  HENT: Negative for mouth sores, sore throat and trouble swallowing.   Eyes: Negative for visual disturbance.  Respiratory: Negative for cough, chest tightness, shortness of breath and wheezing.   Cardiovascular: Negative for chest pain.  Gastrointestinal: Positive for nausea. Negative for abdominal distention, abdominal pain, diarrhea and vomiting.  Endocrine: Negative for polydipsia, polyphagia and polyuria.  Genitourinary: Positive for pelvic pain. Negative for dysuria, frequency and hematuria.  Musculoskeletal: Negative for gait problem.  Skin: Negative  for color change, pallor and rash.  Neurological: Positive for headaches. Negative for dizziness, syncope and light-headedness.  Hematological: Does not bruise/bleed easily.  Psychiatric/Behavioral: Negative for behavioral problems and confusion.     Physical Exam Updated Vital Signs BP 111/66   Pulse (!) 58   Temp 98.7 F (37.1 C) (Oral)   Resp 17   Ht 5\' 2"  (1.575 m)   Wt 132 lb (59.9 kg)   LMP 06/07/2016   SpO2 100%   BMI  24.14 kg/m   Physical Exam  Constitutional: She is oriented to person, place, and time. She appears well-developed and well-nourished. No distress.  HENT:  Head: Normocephalic.  Eyes: Conjunctivae are normal. Pupils are equal, round, and reactive to light. No scleral icterus.  Neck: Normal range of motion. Neck supple. No thyromegaly present.  Cardiovascular: Normal rate and regular rhythm.  Exam reveals no gallop and no friction rub.   No murmur heard. Pulmonary/Chest: Effort normal and breath sounds normal. No respiratory distress. She has no wheezes. She has no rales.  Abdominal: Soft. Bowel sounds are normal. She exhibits no distension. There is no tenderness. There is no rebound.    Musculoskeletal: Normal range of motion.  Neurological: She is alert and oriented to person, place, and time.  Skin: Skin is warm and dry. No rash noted.  Psychiatric: She has a normal mood and affect. Her behavior is normal.     ED Treatments / Results  Labs (all labs ordered are listed, but only abnormal results are displayed) Labs Reviewed  URINALYSIS, ROUTINE W REFLEX MICROSCOPIC (NOT AT [facility]) - Abnormal; Notable for the following:       Result Value   APPearance HAZY (*)    Ketones, ur 15 (*)    Leukocytes, UA SMALL (*)    All other components within normal limits  COMPREHENSIVE METABOLIC PANEL - Abnormal; Notable for the following:    Creatinine, Ser 1.04 (*)    Total Protein 6.2 (*)    ALT 13 (*)    All other components within normal limits  CBC WITH DIFFERENTIAL/PLATELET - Abnormal; Notable for the following:    Hemoglobin 10.9 (*)    HCT 33.8 (*)    Platelets 127 (*)    All other components within normal limits  URINE MICROSCOPIC-ADD ON - Abnormal; Notable for the following:    Squamous Epithelial / LPF 6-30 (*)    Bacteria, UA FEW (*)    Crystals CA OXALATE CRYSTALS (*)    All other components within normal limits  PREGNANCY, URINE  LIPASE, BLOOD    EKG  EKG  Interpretation None       Radiology No results found.  Procedures Procedures (including critical care time)  Medications Ordered in ED Medications  diphenhydrAMINE (BENADRYL) injection 25 mg (25 mg Intramuscular Given 06/29/16 0843)  metoCLOPramide (REGLAN) injection 10 mg (10 mg Intramuscular Given 06/29/16 0844)  ketorolac (TORADOL) 30 MG/ML injection 60 mg (60 mg Intramuscular Given 06/29/16 0843)     Initial Impression / Assessment and Plan / ED Course  I have reviewed the triage vital signs and the nursing notes.  Pertinent labs & imaging results that were available during my care of the patient were reviewed by me and considered in my medical decision making (see chart for details).  Clinical Course    Patient is smiling and states her symptoms are "just like they always are". At this point I feel the patient needs treatment for her migraine headache and rule out  pregnancy. Will give IM Reglan, Benadryl, Toradol which has helped her in the past. If not pregnant plan will be discharge.  Final Clinical Impressions(s) / ED Diagnoses   Final diagnoses:  Migraine without status migrainosus, not intractable, unspecified migraine type    New Prescriptions New Prescriptions   No medications on file     Rolland PorterMark Jaleil Renwick, MD 06/29/16 1022

## 2016-06-29 NOTE — ED Notes (Signed)
Pt ambulated to restroom. 

## 2016-12-04 ENCOUNTER — Encounter (HOSPITAL_COMMUNITY): Payer: Self-pay

## 2016-12-04 ENCOUNTER — Telehealth: Payer: Self-pay | Admitting: Neurology

## 2016-12-04 ENCOUNTER — Ambulatory Visit (HOSPITAL_COMMUNITY)
Admission: EM | Admit: 2016-12-04 | Discharge: 2016-12-04 | Disposition: A | Discharge status: Home / Self Care | Payer: Medicaid Other | Attending: Family Medicine | Admitting: Family Medicine

## 2016-12-04 DIAGNOSIS — G43009 Migraine without aura, not intractable, without status migrainosus: Secondary | ICD-10-CM

## 2016-12-04 DIAGNOSIS — Z3202 Encounter for pregnancy test, result negative: Secondary | ICD-10-CM

## 2016-12-04 LAB — POCT PREGNANCY, URINE: Preg Test, Ur: NEGATIVE

## 2016-12-04 MED ORDER — SUMATRIPTAN SUCCINATE 6 MG/0.5ML ~~LOC~~ SOLN
SUBCUTANEOUS | Status: AC
  Filled 2016-12-04: qty 0.5

## 2016-12-04 MED ORDER — DEXAMETHASONE SODIUM PHOSPHATE 10 MG/ML IJ SOLN
10.0000 mg | Freq: Once | INTRAMUSCULAR | Status: AC
  Administered 2016-12-04: 10 mg via INTRAMUSCULAR

## 2016-12-04 MED ORDER — KETOROLAC TROMETHAMINE 60 MG/2ML IM SOLN
30.0000 mg | Freq: Once | INTRAMUSCULAR | Status: AC
  Administered 2016-12-04: 30 mg via INTRAMUSCULAR

## 2016-12-04 MED ORDER — METOCLOPRAMIDE HCL 5 MG/ML IJ SOLN
INTRAMUSCULAR | Status: AC
  Filled 2016-12-04: qty 2

## 2016-12-04 MED ORDER — DEXAMETHASONE SODIUM PHOSPHATE 10 MG/ML IJ SOLN
INTRAMUSCULAR | Status: AC
  Filled 2016-12-04: qty 1

## 2016-12-04 MED ORDER — METOCLOPRAMIDE HCL 5 MG/ML IJ SOLN
5.0000 mg | Freq: Once | INTRAMUSCULAR | Status: AC
  Administered 2016-12-04: 5 mg via INTRAMUSCULAR

## 2016-12-04 MED ORDER — SUMATRIPTAN SUCCINATE 6 MG/0.5ML ~~LOC~~ SOLN
6.0000 mg | Freq: Once | SUBCUTANEOUS | Status: AC
  Administered 2016-12-04: 6 mg via SUBCUTANEOUS

## 2016-12-04 MED ORDER — KETOROLAC TROMETHAMINE 30 MG/ML IJ SOLN
INTRAMUSCULAR | Status: AC
  Filled 2016-12-04: qty 1

## 2016-12-04 NOTE — Telephone Encounter (Signed)
Patient has not been seen in over 2 years she needs a follow up appt. You can schedule her with caroline or meghan if she does not want to wait for first avail with me thanks

## 2016-12-04 NOTE — Telephone Encounter (Signed)
Pt is calling back and I told her that you mentioned the 10:30 appointment slot, if that is available she would like to take that time.  Can you please call to confirm

## 2016-12-04 NOTE — ED Provider Notes (Signed)
CSN: [id number]     Arrival date & time 12/04/16  1805 History   First MD Initiated Contact with Patient 12/04/16 1908     Chief Complaint  Patient presents with  . Migraine   (Consider location/radiation/quality/duration/timing/severity/associated sxs/prior Treatment) 36 year old female with a long history of migraine headaches and with several visits to the emergency department. She states this headache is completely typical of her past headaches. It is primarily right-sided. She has had it off and on for 2 weeks. She has taken Imitrex last week and she usually takes Topamax but she is out of both of those medications. She has an appointment with both neurologist and PCP tomorrow.  Denies problems with vision, speech, hearing, swallowing, focal paresthesias or weakness. Denies problems with orientation, recall, memory, cognition. Denies dizziness or ataxia. She states she has had the headache cocktails in the past and they seem to work well for her.      Past Medical History:  Diagnosis Date  . Blurred vision   . Migraine    Past Surgical History:  Procedure Laterality Date  . CHOLECYSTECTOMY  2007  . TUBAL LIGATION  2006   Family History  Problem Relation Age of Onset  . Heart attack Father   . Diabetes Maternal Grandmother     some uncles as well   . High blood pressure Maternal Grandmother     some uncles as well  . Migraines Neg Hx    Social History  Substance Use Topics  . Smoking status: Never Smoker  . Smokeless tobacco: Never Used  . Alcohol use No   OB History    No data available     Review of Systems  Constitutional: Positive for activity change. Negative for fever.  HENT: Negative.   Eyes: Negative.   Respiratory: Negative.   Gastrointestinal: Negative.   Genitourinary: Negative.   Musculoskeletal: Negative.   Skin: Negative for rash.  Neurological: Positive for headaches. Negative for dizziness, tremors, seizures, syncope, facial asymmetry, speech  difficulty, weakness and numbness.  Psychiatric/Behavioral: Negative.   All other systems reviewed and are negative.   Allergies  Nyquil multi-symptom [pseudoeph-doxylamine-dm-apap] and Shellfish allergy  Home Medications   Prior to Admission medications   Medication Sig Start Date End Date Taking? Authorizing Provider  Multiple Vitamin (MULTIVITAMIN WITH MINERALS) TABS tablet Take 1 tablet by mouth at bedtime.    Yes Historical Provider, MD  SUMAtriptan (IMITREX) 25 MG tablet May repeat in 2 hours if headache persists or recurs. 06/27/16  Yes Tharon AquasFrank C Patrick, PA  ibuprofen (ADVIL,MOTRIN) 800 MG tablet Take 1 tablet (800 mg total) by mouth 3 (three) times daily as needed for cramping. Patient not taking: Reported on 06/29/2016 10/13/14   Paula LibraJohn Molpus, MD  naproxen (NAPROSYN) 500 MG tablet Take 1 tablet (500 mg total) by mouth 2 (two) times daily as needed for mild pain, moderate pain or headache (TAKE WITH MEALS.). Patient not taking: Reported on 06/29/2016 07/24/15   Mercedes Street, PA-C  ondansetron (ZOFRAN ODT) 8 MG disintegrating tablet 8mg  ODT q4 hours prn nausea Patient not taking: Reported on 06/29/2016 01/05/16   Zadie Rhineonald Wickline, MD   Meds Ordered and Administered this Visit   Medications  ketorolac (TORADOL) injection 30 mg (not administered)  metoCLOPramide (REGLAN) injection 5 mg (not administered)  dexamethasone (DECADRON) injection 10 mg (not administered)  SUMAtriptan (IMITREX) injection 6 mg (not administered)    BP 109/72 (BP Location: Right Arm)   Pulse 75   Temp 98.2 F (36.8 C) (Oral)  Resp 16   LMP 11/07/2016 (Approximate)   SpO2 100%  No data found.   Physical Exam  Constitutional: She is oriented to person, place, and time. She appears well-developed and well-nourished. No distress.  Sitting on the exam table in a relaxed posture. Speech is articulate and clear. Does not appear to be any distress. She said her legs off the exam table.  HENT:  Head:  Normocephalic and atraumatic.  Mouth/Throat: Oropharynx is clear and moist. No oropharyngeal exudate.  Tongue and uvula midline. Swallowing reflex normal.  Eyes: Conjunctivae and EOM are normal. Pupils are equal, round, and reactive to light.  Neck: Normal range of motion. Neck supple.  Cardiovascular: Normal rate, regular rhythm, normal heart sounds and intact distal pulses.   Pulmonary/Chest: Effort normal and breath sounds normal. No respiratory distress.  Musculoskeletal: Normal range of motion. She exhibits no edema.  Neurological: She is alert and oriented to person, place, and time. She has normal strength. No cranial nerve deficit or sensory deficit. Coordination normal. GCS eye subscore is 4. GCS verbal subscore is 5. GCS motor subscore is 6.  Finger to nose normal.  Skin: Skin is warm and dry. She is not diaphoretic.  Psychiatric: Judgment normal.  Nursing note and vitals reviewed.   Urgent Care Course     Procedures (including critical care time)  Labs Review Labs Reviewed - No data to display  Imaging Review No results found.   Visual Acuity Review  Right Eye Distance:   Left Eye Distance:   Bilateral Distance:    Right Eye Near:   Left Eye Near:    Bilateral Near:         MDM   1. Migraine without aura and without status migrainosus, not intractable    You were given the headache cocktail tonight including the Imitrex. Follow-up with your neurologist and PCP tomorrow as scheduled. For any worsening, new symptoms or problems call 911 or go to emergency department. Meds ordered this encounter  Medications  . ketorolac (TORADOL) injection 30 mg  . metoCLOPramide (REGLAN) injection 5 mg  . dexamethasone (DECADRON) injection 10 mg  . SUMAtriptan (IMITREX) injection 6 mg       Hayden Rasmussen, NP 12/04/16 1958

## 2016-12-04 NOTE — Discharge Instructions (Addendum)
You were given the headache cocktail tonight including the Imitrex. Follow-up with your neurologist and PCP tomorrow as scheduled. For any worsening, new symptoms or problems call 911 or go to emergency department.

## 2016-12-04 NOTE — Telephone Encounter (Signed)
Pt was seen for new patient visit 08/2014. She returned 11/2014 for IV migraine cocktail. She has no additional appts scheduled at this time.

## 2016-12-04 NOTE — ED Triage Notes (Signed)
[facility], pt has taken Excedrin Migraine but is having no relief

## 2016-12-04 NOTE — Telephone Encounter (Signed)
Pt called stating she is having migrains and would like to have a cocktail as she did last in 2016  For her really bad headaches.

## 2016-12-04 NOTE — Telephone Encounter (Signed)
Returned call to pt. Sooner appt scheduled for tomorrow @ 8:45.

## 2016-12-05 ENCOUNTER — Encounter: Payer: Self-pay | Admitting: Nurse Practitioner

## 2016-12-05 ENCOUNTER — Ambulatory Visit (INDEPENDENT_AMBULATORY_CARE_PROVIDER_SITE_OTHER): Payer: Medicaid Other | Admitting: Nurse Practitioner

## 2016-12-05 VITALS — BP 103/69 | HR 109 | Ht 62.0 in | Wt 133.0 lb

## 2016-12-05 DIAGNOSIS — G43709 Chronic migraine without aura, not intractable, without status migrainosus: Secondary | ICD-10-CM

## 2016-12-05 MED ORDER — RIZATRIPTAN BENZOATE 10 MG PO TBDP: 10.0000 mg | ORAL_TABLET | ORAL | 3 refills | Status: DC | PRN

## 2016-12-05 MED ORDER — TOPIRAMATE 25 MG PO TABS: 25.0000 mg | ORAL_TABLET | Freq: Two times a day (BID) | ORAL | 3 refills | Status: DC

## 2016-12-05 NOTE — Patient Instructions (Addendum)
Begin Topamax 25 mg at night for one week then increase to 2 tabs at night Maxalt melt acutely for headache/migraine Keep a record of your headaches on Migraine tracker APP Reviewed migraine triggers, patient is on a vegan diet F/U in 2 months  Migraine Headache A migraine headache is an intense, throbbing pain on one side or both sides of the head. Migraines may also cause other symptoms, such as nausea, vomiting, and sensitivity to light and noise. What are the causes? Doing or taking certain things may also trigger migraines, such as:  Alcohol.  Smoking.  Medicines, such as:  Medicine used to treat chest pain (nitroglycerine).  Birth control pills.  Estrogen pills.  Certain blood pressure medicines.  Aged cheeses, chocolate, or caffeine.  Foods or drinks that contain nitrates, glutamate, aspartame, or tyramine.  Physical activity. Other things that may trigger a migraine include:  Menstruation.  Pregnancy.  Hunger.  Stress, lack of sleep, too much sleep, or fatigue.  Weather changes. What increases the risk? The following factors may make you more likely to experience migraine headaches:  Age. Risk increases with age.  Family history of migraine headaches.  Being Caucasian.  Depression and anxiety.  Obesity.  Being a woman.  Having a hole in the heart (patent foramen ovale) or other heart problems. What are the signs or symptoms? The main symptom of this condition is pulsating or throbbing pain. Pain may:  Happen in any area of the head, such as on one side or both sides.  Interfere with daily activities.  Get worse with physical activity.  Get worse with exposure to bright lights or loud noises. Other symptoms may include:  Nausea.  Vomiting.  Dizziness.  General sensitivity to bright lights, loud noises, or smells. Before you get a migraine, you may get warning signs that a migraine is developing (aura). An aura may include:  Seeing  flashing lights or having blind spots.  Seeing bright spots, halos, or zigzag lines.  Having tunnel vision or blurred vision.  Having numbness or a tingling feeling.  Having trouble talking.  Having muscle weakness. How is this diagnosed? A migraine headache can be diagnosed based on:  Your symptoms.  A physical exam.  Tests, such as CT scan or MRI of the head. These imaging tests can help rule out other causes of headaches.  Taking fluid from the spine (lumbar puncture) and analyzing it (cerebrospinal fluid analysis, or CSF analysis). How is this treated? A migraine headache is usually treated with medicines that:  Relieve pain.  Relieve nausea.  Prevent migraines from coming back. Treatment may also include:  Acupuncture.  Lifestyle changes like avoiding foods that trigger migraines. Follow these instructions at home: Medicines   Take over-the-counter and prescription medicines only as told by your health care provider.  Do not drive or use heavy machinery while taking prescription pain medicine.  To prevent or treat constipation while you are taking prescription pain medicine, your health care provider may recommend that you:  Drink enough fluid to keep your urine clear or pale yellow.  Take over-the-counter or prescription medicines.  Eat foods that are high in fiber, such as fresh fruits and vegetables, whole grains, and beans.  Limit foods that are high in fat and processed sugars, such as fried and sweet foods. Lifestyle   Avoid alcohol use.  Do not use any products that contain nicotine or tobacco, such as cigarettes and e-cigarettes. If you need help quitting, ask your health care provider.  Get at least 8 hours of sleep every night.  Limit your stress. General instructions    Keep a journal to find out what may trigger your migraine headaches. For example, write down:  What you eat and drink.  How much sleep you get.  Any change to your  diet or medicines.  If you have a migraine:  Avoid things that make your symptoms worse, such as bright lights.  It may help to lie down in a dark, quiet room.  Do not drive or use heavy machinery.  Ask your health care provider what activities are safe for you while you are experiencing symptoms.  Keep all follow-up visits as told by your health care provider. This is important. Contact a health care provider if:  You develop symptoms that are different or more severe than your usual migraine symptoms. Get help right away if:  Your migraine becomes severe.  You have a fever.  You have a stiff neck.  You have vision loss.  Your muscles feel weak or like you cannot control them.  You start to lose your balance often.  You develop trouble walking.  You faint. This information is not intended to replace advice given to you by your health care provider. Make sure you discuss any questions you have with your health care provider. Document Released: 09/04/2005 Document Revised: 03/24/2016 Document Reviewed: 02/21/2016 Elsevier Interactive Patient Education  2017 [employer]

## 2016-12-05 NOTE — Progress Notes (Signed)
[facility]  PATIENT: Brittney Watkins DOB: June 07, 1981   REASON FOR VISIT: Follow-up for worsening migraine headaches HISTORY FROM: Patient    HISTORY OF PRESENT ILLNESS:UPDATE 12/05/2016 CM Ms. Brittney Watkins, 36 year old female returns for follow-up with a ten-year history of migraine headaches. She was last seen in this office December 2015. Since that time she has gotten off of her preventive medication and she claims Imitrex no longer works acutely. In the last year she has been to the emergency room 4 times for migraine cocktail. She claims she has about 3 migraines per week. A trigger for her is low blood sugar. She is on a vegan diet and takes a B complex vitamin. She has not on any birth control she had her tubes tied. Her headaches typically are on the right side throbbing pain pressure, I light sensitivity and sound sensitivity sometimes nausea no vomiting. MRI of the brain has been normal. She returns for reevaluation   HISTORY 09/08/14 Brittney Watkins is a 36 y.o. female here as a referral from Dr. Julio Sicks for headaches. pmhx elevated hgba1c, vit d deficiency, migraines, hld.  Patient has had headaches ofr 8-9 years. Started with birth control patch. Recently gtting worse. Having pain behind the eyes. Vision changes, blurry. Migraines are on the right side. They are throbbing. Pressure behind the eye. Sometimes sharp. +light sensitivity, +sound sensitivity, +nausea. Fatigued more recently. In the last month she has had 10 headaches. If she can catch them with imitrex they last an hour but if she can't catch them in time it lasts until she gets to the ED and gets a migraine cocktail. Can last 24 hours. 10/10 pain. Imitrex works. Tried relpax which caused her to vomit. Started Topamax  twice a day. She doesn't take the topamax every day like she is supposed to. Migraines getting more frequent and more severe. No aura. Unknown triggers. No focal neurologic  symptoms with the migraines.   REVIEW OF SYSTEMS: Full 14 system review of systems performed and notable only for those listed, all others are neg:  Constitutional: neg  Cardiovascular: neg Ear/Nose/Throat: neg  Skin: neg Eyes: neg Respiratory: neg Gastroitestinal: neg  Hematology/Lymphatic: neg  Endocrine: neg Musculoskeletal:neg Allergy/Immunology: neg Neurological: Migraine Psychiatric: neg Sleep : neg   ALLERGIES: Allergies  Allergen Reactions  . Nyquil Multi-Symptom [Pseudoeph-Doxylamine-Dm-Apap] Swelling and Rash  . Shellfish Allergy Rash    HOME MEDICATIONS: Outpatient Medications Prior to Visit  Medication Sig Dispense Refill  . Multiple Vitamin (MULTIVITAMIN WITH MINERALS) TABS tablet Take 1 tablet by mouth at bedtime.     . SUMAtriptan (IMITREX) 25 MG tablet May repeat in 2 hours if headache persists or recurs. 10 tablet 2   Facility-Administered Medications Prior to Visit  Medication Dose Route Frequency Provider Last Rate Last Dose  . methylPREDNISolone sodium succinate (SOLU-MEDROL) 500 mg in sodium chloride 0.9 % 100 mL IVPB  500 mg Intravenous Continuous Anson Fret, MD   500 mg at 11/26/14 1653  . valproate (DEPACON) 1,000 mg in sodium chloride 0.9 % 100 mL IVPB  1,000 mg Intravenous Continuous Anson Fret, MD 110 mL/hr at 11/26/14 1630 1,000 mg at 11/26/14 1630    PAST MEDICAL HISTORY: Past Medical History:  Diagnosis Date  . Blurred vision   . Migraine     PAST SURGICAL HISTORY: Past Surgical History:  Procedure Laterality Date  . CHOLECYSTECTOMY  2007  . TUBAL LIGATION  2006    FAMILY HISTORY: Family History  Problem Relation Age  of Onset  . Heart attack Father   . Diabetes Maternal Grandmother     some uncles as well   . High blood pressure Maternal Grandmother     some uncles as well  . Migraines Neg Hx     SOCIAL HISTORY: Social History   Social History  . Marital status: Single    Spouse name: N/A  . Number of  children: 2  . Years of education: 12+   Occupational History  . Not on file.   Social History Main Topics  . Smoking status: Never Smoker  . Smokeless tobacco: Never Used  . Alcohol use No  . Drug use: No  . Sexual activity: Yes    Birth control/ protection: Surgical   Other Topics Concern  . Not on file   Social History Narrative   Patient lives at home with children and their father  Keiran Sias    Patient has 2 children    Patient is a Archivist    Patient works for [employer]    Patient is right handed         PHYSICAL EXAM  Vitals:   12/05/16 0848  BP: 103/69  Pulse: (!) 109  Weight: 133 lb (60.3 kg)  Height:  (1.575 m)   Body mass index is 24.33 kg/m.  Generalized: Well developed, in no acute distress  Head: normocephalic and atraumatic,. Oropharynx benign  Neck: Supple, no carotid bruits  Cardiac: Regular rate rhythm, no murmur  Musculoskeletal: No deformity   Neurological examination   Mentation: Alert oriented to time, place, history taking. Attention span and concentration appropriate. Recent and remote memory intact.  Follows all commands speech and language fluent.   Cranial nerve II-XII: Fundoscopic exam reveals sharp disc margins.Pupils were equal round reactive to light extraocular movements were full, visual field were full on confrontational test. Facial sensation and strength were normal. hearing was intact to finger rubbing bilaterally. Uvula tongue midline. head turning and shoulder shrug were normal and symmetric.Tongue protrusion into cheek strength was normal. Motor: normal bulk and tone, full strength in the BUE, BLE, fine finger movements normal, no pronator drift. No focal weakness Sensory: normal and symmetric to light touch, pinprick, and  Vibration, in the upper and lower extremities  Coordination: finger-nose-finger, heel-to-shin bilaterally, no dysmetria, no tremor Reflexes: Brachioradialis 2/2, biceps 2/2,  triceps 2/2, patellar 2/2, Achilles 2/2, plantar responses were flexor bilaterally. Gait and Station: Rising up from seated position without assistance, normal stance,  moderate stride, good arm swing, smooth turning, able to perform tiptoe, and heel walking without difficulty. Tandem gait is steady  DIAGNOSTIC DATA (LABS, IMAGING, TESTING) - I reviewed patient records, labs, notes, testing and imaging myself where available.  Lab Results  Component Value Date   WBC 7.9 06/29/2016   HGB 10.9 (L) 06/29/2016   HCT 33.8 (L) 06/29/2016   MCV 81.3 06/29/2016   PLT 127 (L) 06/29/2016      Component Value Date/Time   NA 139 06/29/2016 0850   NA 139 11/26/2014 1557   K 3.7 06/29/2016 0850   CL 110 06/29/2016 0850   CO2 24 06/29/2016 0850   GLUCOSE 95 06/29/2016 0850   BUN 7 06/29/2016 0850   BUN 12 11/26/2014 1557   CREATININE 1.04 (H) 06/29/2016 0850   CALCIUM 9.4 06/29/2016 0850   PROT 6.2 (L) 06/29/2016 0850   PROT 6.8 11/26/2014 1557   ALBUMIN 3.7 06/29/2016 0850   ALBUMIN 4.1 11/26/2014 1557   AST  20 06/29/2016 0850   ALT 13 (L) 06/29/2016 0850   ALKPHOS 42 06/29/2016 0850   BILITOT 0.5 06/29/2016 0850   BILITOT 0.2 11/26/2014 1557   GFRNONAA >60 06/29/2016 0850   GFRAA >60 06/29/2016 0850    ASSESSMENT AND PLAN  36 y.o. year old female  has a past medical history of Blurred vision and Migraine. here  to follow-up for worsening migraine headaches. She was seen in the emergency room yesterday and received  migraine cocktail.   PLAN: Begin Topamax 25 mg at night for one week then increase to 2 tabs at night, reviewed most frequent side effects of the drug. Maxalt melt acutely for headache/migraine Keep a record of your headaches on Migraine tracker APP Reviewed migraine triggers, patient is on a vegan diet F/U in 2 months I spent 25 minutes in total face to face time with the patient more than 50% of which was spent counseling and coordination of care, reviewing ER visits  ,test results reviewing medications and discussing and reviewing the diagnosis of migraine and further treatment options. Importance of keeping a diary if headaches worsen to include the time of the headache what you're doing any other specific information that would be useful. Discussed stress relief techniques such as deep breathing muscle relaxation mental relaxation to music. Discussed importance of exercise, regular meals  and sleep. Sleep deprivation can be a migraine trigger.  Nilda Riggs, [facility], [facility], APRN  [id number] [facility] [address] [place], [place] [address] [phone]

## 2016-12-25 ENCOUNTER — Ambulatory Visit: Payer: Medicaid Other | Admitting: Neurology

## 2017-01-25 NOTE — Progress Notes (Signed)
Personally  participated in, made any corrections needed, and agree with history, physical, neuro exam,assessment and plan as stated above.    Antonia Ahern, MD [facility] 

## 2017-02-06 ENCOUNTER — Ambulatory Visit: Payer: Medicaid Other | Admitting: Nurse Practitioner

## 2017-02-07 ENCOUNTER — Encounter: Payer: Self-pay | Admitting: Nurse Practitioner

## 2017-03-06 ENCOUNTER — Telehealth: Payer: Self-pay | Admitting: *Deleted

## 2017-03-07 NOTE — Telephone Encounter (Signed)
I spoke to pt.  I relayed per insurance, medicaid.  She did not need PA for maxalt odt.  (she can get max of 12 tabs/30days). This PA request was due to pt getting the imitrex and then trying to get maxalt.  I told her this.  Once the 30days is up she can get maxalt.  She verbalized understanding.

## 2017-03-12 ENCOUNTER — Ambulatory Visit (INDEPENDENT_AMBULATORY_CARE_PROVIDER_SITE_OTHER): Payer: Medicaid Other | Admitting: Nurse Practitioner

## 2017-03-12 ENCOUNTER — Encounter: Payer: Self-pay | Admitting: Nurse Practitioner

## 2017-03-12 VITALS — BP 107/70 | HR 88 | Ht 62.0 in | Wt 128.8 lb

## 2017-03-12 DIAGNOSIS — R51 Headache: Secondary | ICD-10-CM | POA: Diagnosis not present

## 2017-03-12 DIAGNOSIS — R519 Headache, unspecified: Secondary | ICD-10-CM

## 2017-03-12 MED ORDER — DIVALPROEX SODIUM ER 500 MG PO TB24: 500.0000 mg | ORAL_TABLET | Freq: Every day | ORAL | 4 refills | Status: DC

## 2017-03-12 NOTE — Patient Instructions (Addendum)
Begin Depakote ER 500mg  at night reviewed most frequent side effects of the drug. Maxalt melt acutely for headache/migraine Keep a record of your headaches on Migraine tracker APP Reviewed migraine triggers, patient is on a vegan diet F/U in 3 months Valproic Acid, Divalproex Sodium capsules What is this medicine? VALPROIC ACID (val PROE ik AS id) is used to treat certain types of seizures in patients with epilepsy. This medicine may be used for other purposes; ask your health care provider or pharmacist if you have questions. COMMON BRAND NAME(S): Depakene What should I tell my health care provider before I take this medicine? They need to know if you have any of these conditions: -if you often drink alcohol -kidney disease -liver disease -low platelet counts -mitochondrial disease -suicidal thoughts, plans, or attempt; a previous suicide attempt by you or a family member -urea cycle disorder (UCD) -an unusual or allergic reaction to divalproex sodium, sodium valproate, valproic acid, other medicines, foods, dyes, or preservatives -pregnant or trying to get pregnant -breast-feeding How should I use this medicine? Take this medicine by mouth with a glass of water. Follow the directions on the prescription label. Do not cut, crush or chew this medicine. You can take it with or without food. If it upsets your stomach, take it with food. Take your doses at regular intervals. Do not take your medicine more often than directed. Do not stop taking except on your doctor's advice. A special MedGuide will be given to you by the pharmacist with each prescription and refill. Be sure to read this information carefully each time. Talk to your pediatrician regarding the use of this medicine in children. While this drug may be prescribed for children as young as 10 years for selected conditions, precautions do apply. Overdosage: If you think you have taken too much of this medicine contact a poison  control center or emergency room at once. NOTE: This medicine is only for you. Do not share this medicine with others. What if I miss a dose? If you miss a dose, take it as soon as you can. If it is almost time for your next dose, take only that dose. Do not take double or extra doses. What may interact with this medicine? Do not take this medicine with any of the following medications: -sodium phenylbutyrate This medicine may also interact with the following medications: -aspirin -certain antibiotics like ertapenem, imipenem, meropenem -certain medicines for depression, anxiety, or psychotic disturbances -certain medicines for seizures like carbamazepine, clonazepam, diazepam, ethosuximide, felbamate, lamotrigine, phenobarbital, phenytoin, primidone, rufinamide, topiramate -certain medicines that treat or prevent blood clots like warfarin -cholestyramine -female hormones, like estrogens and birth control pills, patches, or rings -propofol -rifampin -ritonavir -tolbutamide -zidovudine This list may not describe all possible interactions. Give your health care provider a list of all the medicines, herbs, non-prescription drugs, or dietary supplements you use. Also tell them if you smoke, drink alcohol, or use illegal drugs. Some items may interact with your medicine. What should I watch for while using this medicine? Tell your doctor or healthcare professional if your symptoms do not get better or they start to get worse. Wear a medical ID bracelet or chain, and carry a card that describes your disease and details of your medicine and dosage times. You may get drowsy, dizzy, or have blurred vision. Do not drive, use machinery, or do anything that needs mental alertness until you know how this medicine affects you. To reduce dizzy or fainting spells, do not sit or  stand up quickly, especially if you are an older patient. Alcohol can increase drowsiness and dizziness. Avoid alcoholic  drinks. This medicine can make you more sensitive to the sun. Keep out of the sun. If you cannot avoid being in the sun, wear protective clothing and use sunscreen. Do not use sun lamps or tanning beds/booths. Patients and their families should watch out for new or worsening depression or thoughts of suicide. Also watch out for sudden changes in feelings such as feeling anxious, agitated, panicky, irritable, hostile, aggressive, impulsive, severely restless, overly excited and hyperactive, or not being able to sleep. If this happens, especially at the beginning of treatment or after a change in dose, call your health care professional. Women should inform their doctor if they wish to become pregnant or think they might be pregnant. There is a potential for serious side effects to an unborn child. Talk to your health care professional or pharmacist for more information. Women who become pregnant while using this medicine may enroll in the [place] Antiepileptic Drug Pregnancy Registry by calling [phone]. This registry collects information about the safety of antiepileptic drug use during pregnancy. What side effects may I notice from receiving this medicine? Side effects that you should report to your doctor or health care professional as soon as possible: -allergic reactions like skin rash, itching or hives, swelling of the face, lips, or tongue -changes in vision -redness, blistering, peeling or loosening of the skin, including inside the mouth -signs and symptoms of liver injury like dark yellow or brown urine; general ill feeling or flu-like symptoms; light-colored stools; loss of appetite; nausea; right upper belly pain; unusually weak or tired; yellowing of the eyes or skin -suicidal thoughts or other mood changes -unusual bleeding or bruising Side effects that usually do not require medical attention (report to your doctor or health care professional if they continue or are  bothersome): -constipation -diarrhea -dizziness -hair loss -headache -loss of appetite -weight gain This list may not describe all possible side effects. Call your doctor for medical advice about side effects. You may report side effects to FDA at 1-800-FDA-1088. Where should I keep my medicine? Keep out of reach of children. Store at room temperature between 15 and 25 degrees C (59 and 77 degrees F). Keep container tightly closed. Throw away any unused medicine after the expiration date. NOTE: This sheet is a summary. It may not cover all possible information. If you have questions about this medicine, talk to your doctor, pharmacist, or health care provider.  2018 Elsevier/Gold Standard (2015-12-09 08:27:44)

## 2017-03-12 NOTE — Progress Notes (Signed)
[facility]  PATIENT: Brittney Watkins DOB: [birth year]   REASON FOR VISIT: Follow-up for migraine headaches HISTORY FROM: Patient    HISTORY OF PRESENT ILLNESS:UPDATE 06/25/2018CM Brittney Watkins 36 year old female returns for follow-up for history of migraine headaches for over 10 years. When last seen in March she was placed on Topamax but said she had memory loss on the medication. Her Imitrex was no longer working and she was switched to Maxalt. She is having about 1-2 headaches per week at this time. A trigger for her headaches as low blood sugar, she is on A-Vegan  Diet. Her headaches tend to be throbbing in nature usually on the right side with light sensitivity and sound sensitivity sometimes nausea but no vomiting. MRI of the brain and normal. She returns for reevaluation   UPDATE 12/05/2016 CM Brittney Watkins, 36 year old female returns for follow-up with a ten-year history of migraine headaches. She was last seen in this office December 2015. Since that time she has gotten off of her preventive medication and she claims Imitrex no longer works acutely. In the last year she has been to the emergency room 4 times for migraine cocktail. She claims she has about 3 migraines per week. A trigger for her is low blood sugar. She is on a vegan diet and takes a B complex vitamin. She has not on any birth control she had her tubes tied. Her headaches typically are on the right side throbbing pain pressure, I light sensitivity and sound sensitivity sometimes nausea no vomiting. MRI of the brain has been normal. She returns for reevaluation   HISTORY 09/08/14 Brittney Watkins is a 36 y.o. female here as a referral from Dr. Julio Sicks for headaches. pmhx elevated hgba1c, vit d deficiency, migraines, hld.  Patient has had headaches ofr 8-9 years. Started with birth control patch. Recently gtting worse. Having pain behind the eyes. Vision changes, blurry. Migraines are on the right  side. They are throbbing. Pressure behind the eye. Sometimes sharp. +light sensitivity, +sound sensitivity, +nausea. Fatigued more recently. In the last month she has had 10 headaches. If she can catch them with imitrex they last an hour but if she can't catch them in time it lasts until she gets to the ED and gets a migraine cocktail. Can last 24 hours. 10/10 pain. Imitrex works. Tried relpax which caused her to vomit. Started Topamax 25mg  twice a day. She doesn't take the topamax every day like she is supposed to. Migraines getting more frequent and more severe. No aura. Unknown triggers. No focal neurologic symptoms with the migraines.   REVIEW OF SYSTEMS: Full 14 system review of systems performed and notable only for those listed, all others are neg:  Constitutional: neg  Cardiovascular: neg Ear/Nose/Throat: neg  Skin: neg Eyes: neg Respiratory: neg Gastroitestinal: neg  Hematology/Lymphatic: neg  Endocrine: neg Musculoskeletal:neg Allergy/Immunology: neg Neurological: Migraine Psychiatric: neg Sleep : neg   ALLERGIES: Allergies  Allergen Reactions  . Nyquil Multi-Symptom [Pseudoeph-Doxylamine-Dm-Apap] Swelling and Rash  . Shellfish Allergy Rash    HOME MEDICATIONS: Outpatient Medications Prior to Visit  Medication Sig Dispense Refill  . RESTASIS MULTIDOSE 0.05 % ophthalmic emulsion INSTILL 1 GTT BID OU  3  . rizatriptan (MAXALT-MLT) 10 MG disintegrating tablet Take 1 tablet (10 mg total) by mouth as needed for migraine. May repeat in 2 hours if needed 10 tablet 3  . Multiple Vitamin (MULTIVITAMIN WITH MINERALS) TABS tablet Take 1 tablet by mouth at bedtime.     . topiramate (  TOPAMAX) 25 MG tablet Take 1 tablet (25 mg total) by mouth 2 (two) times daily. 1 po at night for 1 week then increase to 2 caps at night (Patient not taking: Reported on 03/12/2017) 60 tablet 3   Facility-Administered Medications Prior to Visit  Medication Dose Route Frequency Provider Last Rate Last Dose   . methylPREDNISolone sodium succinate (SOLU-MEDROL) 500 mg in sodium chloride 0.9 % 100 mL IVPB  500 mg Intravenous Continuous Naomie Dean B, MD   500 mg at 11/26/14 1653  . valproate (DEPACON) 1,000 mg in sodium chloride 0.9 % 100 mL IVPB  1,000 mg Intravenous Continuous Naomie Dean B, MD 110 mL/hr at 11/26/14 1630 1,000 mg at 11/26/14 1630    PAST MEDICAL HISTORY: Past Medical History:  Diagnosis Date  . Blurred vision   . Migraine     PAST SURGICAL HISTORY: Past Surgical History:  Procedure Laterality Date  . CHOLECYSTECTOMY  2007  . TUBAL LIGATION  2006    FAMILY HISTORY: Family History  Problem Relation Age of Onset  . Heart attack Father   . Diabetes Maternal Grandmother        some uncles as well   . High blood pressure Maternal Grandmother        some uncles as well  . Migraines Neg Hx     SOCIAL HISTORY: Social History   Social History  . Marital status: Single    Spouse name: N/A  . Number of children: 2  . Years of education: 12+   Occupational History  . Not on file.   Social History Main Topics  . Smoking status: Never Smoker  . Smokeless tobacco: Never Used  . Alcohol use No  . Drug use: No  . Sexual activity: Yes    Birth control/ protection: Surgical   Other Topics Concern  . Not on file   Social History Narrative   Patient lives at home with children and their father  [name]    Patient has 2 children    Patient is a Archivist    Patient works for [employer]    Patient is right handed         PHYSICAL EXAM  Vitals:   03/12/17 1458  BP: 107/70  Pulse: 88  Weight: 128 lb 12.8 oz (58.4 kg)  Height: 5\' 2"  (1.575 m)   Body mass index is 23.56 kg/m.  Generalized: Well developed, in no acute distress  Head: normocephalic and atraumatic,. Oropharynx benign  Neck: Supple,  Musculoskeletal: No deformity   Neurological examination   Mentation: Alert oriented to time, place, history taking. Attention  span and concentration appropriate. Recent and remote memory intact.  Follows all commands speech and language fluent.   Cranial nerve II-XII: Pupils were equal round reactive to light extraocular movements were full, visual field were full on confrontational test. Facial sensation and strength were normal. hearing was intact to finger rubbing bilaterally. Uvula tongue midline. head turning and shoulder shrug were normal and symmetric.Tongue protrusion into cheek strength was normal. Motor: normal bulk and tone, full strength in the BUE, BLE, fine finger movements normal, no pronator drift. No focal weakness Sensory: normal and symmetric to light touch, pinprick, and  Vibration, in the upper and lower extremities  Coordination: finger-nose-finger, heel-to-shin bilaterally, no dysmetria, no tremor Reflexes: Brachioradialis 2/2, biceps 2/2, triceps 2/2, patellar 2/2, Achilles 2/2, plantar responses were flexor bilaterally. Gait and Station: Rising up from seated position without assistance, normal stance,  moderate stride,  good arm swing, smooth turning, able to perform tiptoe, and heel walking without difficulty. Tandem gait is steady  DIAGNOSTIC DATA (LABS, IMAGING, TESTING) - I reviewed patient records, labs, notes, testing and imaging myself where available.  Lab Results  Component Value Date   WBC 7.9 06/29/2016   HGB 10.9 (L) 06/29/2016   HCT 33.8 (L) 06/29/2016   MCV 81.3 06/29/2016   PLT 127 (L) 06/29/2016      Component Value Date/Time   NA 139 06/29/2016 0850   NA 139 11/26/2014 1557   K 3.7 06/29/2016 0850   CL 110 06/29/2016 0850   CO2 24 06/29/2016 0850   GLUCOSE 95 06/29/2016 0850   BUN 7 06/29/2016 0850   BUN 12 11/26/2014 1557   CREATININE 1.04 (H) 06/29/2016 0850   CALCIUM 9.4 06/29/2016 0850   PROT 6.2 (L) 06/29/2016 0850   PROT 6.8 11/26/2014 1557   ALBUMIN 3.7 06/29/2016 0850   ALBUMIN 4.1 11/26/2014 1557   AST 20 06/29/2016 0850   ALT 13 (L) 06/29/2016 0850    ALKPHOS 42 06/29/2016 0850   BILITOT 0.5 06/29/2016 0850   BILITOT 0.2 11/26/2014 1557   GFRNONAA >60 06/29/2016 0850   GFRAA >60 06/29/2016 0850    ASSESSMENT AND PLAN  36 y.o. year old female  has a past medical history of Blurred vision and Migraine. here  to follow-up for migraine headaches. She was placed on Topamax at her last visit had memory loss on the medication. Imitrex is no longer working.   PLAN: Begin Depakote ER 500mg  at night reviewed most frequent side effects of the drug with the pt and given copy Maxalt melt acutely for headache/migraine Keep a record of your headaches on Migraine tracker APP Reviewed migraine triggers, she can get a headache with low blood sugar patient is on a vegan diet F/U in 3 months I spent 25 minutes in total face to face time with the patient more than 50% of which was spent counseling and coordination of care,  reviewing medications and discussing and reviewing the diagnosis of migraine and further treatment options. Importance of keeping a diary if headaches worsen to include the time of the headache what you're doing any other specific information that would be useful. Discussed stress relief techniques such as deep breathing muscle relaxation mental relaxation to music. Discussed importance of exercise, regular meals  and sleep. Sleep deprivation can be a migraine trigger.  Nilda RiggsNancy Carolyn Josiah Nieto, [facility] LLCGNP, [facility] CenterBC, APRN  [facility] [address], [address] [place], [place] [address] [phone]

## 2017-03-31 NOTE — Progress Notes (Signed)
Personally  participated in, made any corrections needed, and agree with history, physical, neuro exam,assessment and plan as stated above.    Callum Wolf, MD [facility] 

## 2017-04-06 ENCOUNTER — Encounter (HOSPITAL_COMMUNITY): Payer: Self-pay | Admitting: *Deleted

## 2017-04-06 ENCOUNTER — Ambulatory Visit (HOSPITAL_COMMUNITY)
Admission: EM | Admit: 2017-04-06 | Discharge: 2017-04-06 | Disposition: A | Discharge status: Home / Self Care | Payer: Medicaid Other | Attending: Family | Admitting: Family

## 2017-04-06 DIAGNOSIS — F411 Generalized anxiety disorder: Secondary | ICD-10-CM | POA: Diagnosis not present

## 2017-04-06 MED ORDER — BUSPIRONE HCL 5 MG PO TABS: 5.0000 mg | ORAL_TABLET | Freq: Three times a day (TID) | ORAL | 0 refills | Status: DC | PRN

## 2017-04-06 NOTE — Discharge Instructions (Signed)
As discussed, we agreed your symptoms most likely related to anxiety with new position at work. Your may trial a medication called BuSpar for anxiety. You may take this medication as needed up to 3 times a day. I did send in one month supply; if you would like further medication, please follow up with primary care provider.  Pleasure meeting you

## 2017-04-06 NOTE — ED Provider Notes (Signed)
CSN: [id number]     Arrival date & time 04/06/17  1238 History   None    Chief Complaint  Patient presents with  . Nausea   (Consider location/radiation/quality/duration/timing/severity/associated sxs/prior Treatment) CC: Nausea and diarrhea x one day.  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 Migraine since resolved.  Then started to have Nausa, and abdominal 'cramps 2 days' ago. No abdominal pain today Today, still feel nauseated. Vomit was 'spit up' and clear which occurred today on way to work. Thinks its stress. No blood or undigested food. 4 episodes of diarrhea today, brown watery today, which occurred this morning. Diarrhea does not awaken her from sleep. No bad odor. No blood in stool, dysuria. Notes PO doxycyline for one month for acne.  Denies any recent travel, sick contacts, suspicious food intake.  Follows vegan diet.   No depression. No thoughts hurting herself or anyone else. Sleeping okay. Has never been treated for anxiety, depression   No concerns for STDs.   S/p tubal ligation. Hasn't been sexually active in past month. No concern for pregnancy. No changed in vaginal discharge. Normal menstrual cycles.   Works at [employer] center.   No alcohol use.      History of cholecystectomy. Past Medical History:  Diagnosis Date  . Blurred vision   . Migraine    Past Surgical History:  Procedure Laterality Date  . CHOLECYSTECTOMY  2007  . TUBAL LIGATION  2006   Family History  Problem Relation Age of Onset  . Heart attack Father   . Diabetes Maternal Grandmother        some uncles as well   . High blood pressure Maternal Grandmother        some uncles as well  . Migraines Neg Hx    Social History  Substance Use Topics  . Smoking status: Never Smoker  . Smokeless tobacco: Never Used  . Alcohol use No   OB History    No data available     Review of  Systems  Constitutional: Negative for chills and fever.  Eyes: Negative for visual disturbance.  Respiratory: Negative for cough.   Cardiovascular: Negative for chest pain and palpitations.  Gastrointestinal: Positive for diarrhea, nausea and vomiting. Negative for abdominal distention, blood in stool and constipation.  Genitourinary: Negative for dysuria, menstrual problem, vaginal bleeding and vaginal discharge.  Neurological: Negative for dizziness and headaches.  Psychiatric/Behavioral: Negative for sleep disturbance and suicidal ideas. The patient is nervous/anxious.     Allergies  Nyquil multi-symptom [pseudoeph-doxylamine-dm-apap] and Shellfish allergy  Home Medications   Prior to Admission medications   Medication Sig Start Date End Date Taking? Authorizing Provider  busPIRone (BUSPAR) 5 MG tablet Take 1 tablet (5 mg total) by mouth 3 (three) times daily as needed. For anxiety. 04/06/17   Allegra Grana, FNP  divalproex (DEPAKOTE ER) 500 MG 24 hr tablet Take 1 tablet (500 mg total) by mouth daily. 03/12/17   Nilda Riggs, NP  Multiple Vitamin (MULTIVITAMIN WITH MINERALS) TABS tablet Take 1 tablet by mouth at bedtime.     [provider]  RESTASIS MULTIDOSE 0.05 % ophthalmic emulsion INSTILL 1 GTT BID OU 09/15/16   [provider]  rizatriptan (MAXALT-MLT) 10 MG disintegrating tablet Take 1 tablet (10 mg total) by mouth as needed for migraine. May repeat in 2 hours if needed 12/05/16   Nilda Riggs,  NP   Meds Ordered and Administered this Visit  Medications - No data to display  BP 124/76 (BP Location: Right Arm)   Pulse 78   Temp 98.6 F (37 C) (Oral)   Resp 18   LMP 03/15/2017   SpO2 100%  No data found.   Physical Exam  Constitutional: She appears well-developed and well-nourished.  Eyes: Conjunctivae are normal.  Cardiovascular: Normal rate, regular rhythm, normal heart sounds and normal pulses.   Pulmonary/Chest: Effort normal  and breath sounds normal. She has no wheezes. She has no rhonchi. She has no rales.  Abdominal: Soft. Normal appearance and bowel sounds are normal. She exhibits no distension, no fluid wave, no ascites and no mass. There is no tenderness. There is no rigidity, no rebound, no guarding and no CVA tenderness.  Neurological: She is alert.  Skin: Skin is warm and dry.  Psychiatric: She has a normal mood and affect. Her speech is normal and behavior is normal. Thought content normal.  Vitals reviewed.   Urgent Care Course     Procedures (including critical care time)     MDM   1. Anxiety state     Afebrile. Patient is well-appearing. Reassured by normal abdominal exam. After long discussion with patient starting position were, we jointly agreed likely related to anxiety. Trial of BuSpar. If she would like to stay on this medication, she understands to follow up with PCP. Does note recent antibiotic use. Declined stool culture, abdominal imaging, which I think is appropriate this time as long diarrhea doesn't persist. Return precautions given.   Allegra Granarnett, Mikala Podoll G, FNP 04/06/17 1410

## 2017-04-06 NOTE — ED Triage Notes (Signed)
PT  REPORTS    NAUSEA     DIARRHEA   TINY  BIT  OF  VOMITING  AS   WELL  AS   LOW   ABD   CRAMPS   WHICH  STARTED  YESTERDAY

## 2017-04-15 ENCOUNTER — Emergency Department (HOSPITAL_COMMUNITY): Payer: Medicaid Other

## 2017-04-15 ENCOUNTER — Emergency Department (HOSPITAL_COMMUNITY)
Admission: EM | Admit: 2017-04-15 | Discharge: 2017-04-15 | Disposition: A | Discharge status: Home / Self Care | Payer: Medicaid Other | Attending: Emergency Medicine | Admitting: Emergency Medicine

## 2017-04-15 ENCOUNTER — Encounter (HOSPITAL_COMMUNITY): Payer: Self-pay | Admitting: Emergency Medicine

## 2017-04-15 DIAGNOSIS — Z79899 Other long term (current) drug therapy: Secondary | ICD-10-CM | POA: Insufficient documentation

## 2017-04-15 DIAGNOSIS — E860 Dehydration: Secondary | ICD-10-CM | POA: Diagnosis not present

## 2017-04-15 DIAGNOSIS — R1084 Generalized abdominal pain: Secondary | ICD-10-CM | POA: Diagnosis present

## 2017-04-15 DIAGNOSIS — R112 Nausea with vomiting, unspecified: Secondary | ICD-10-CM | POA: Insufficient documentation

## 2017-04-15 DIAGNOSIS — Z9049 Acquired absence of other specified parts of digestive tract: Secondary | ICD-10-CM | POA: Diagnosis not present

## 2017-04-15 DIAGNOSIS — R197 Diarrhea, unspecified: Secondary | ICD-10-CM

## 2017-04-15 HISTORY — DX: Urinary tract infection, site not specified: N39.0

## 2017-04-15 LAB — CBC
HEMATOCRIT: 34.4 % — AB (ref 36.0–46.0)
HEMOGLOBIN: 11.3 g/dL — AB (ref 12.0–15.0)
MCH: 25.7 pg — ABNORMAL LOW (ref 26.0–34.0)
MCHC: 32.8 g/dL (ref 30.0–36.0)
MCV: 78.4 fL (ref 78.0–100.0)
Platelets: 151 10*3/uL (ref 150–400)
RBC: 4.39 MIL/uL (ref 3.87–5.11)
RDW: 15.1 % (ref 11.5–15.5)
WBC: 5.1 10*3/uL (ref 4.0–10.5)

## 2017-04-15 LAB — URINALYSIS, ROUTINE W REFLEX MICROSCOPIC
BACTERIA UA: NONE SEEN
BILIRUBIN URINE: NEGATIVE
Glucose, UA: NEGATIVE mg/dL
Ketones, ur: 20 mg/dL — AB
LEUKOCYTES UA: NEGATIVE
Nitrite: NEGATIVE
PROTEIN: 30 mg/dL — AB
Specific Gravity, Urine: 1.026 (ref 1.005–1.030)
pH: 6 (ref 5.0–8.0)

## 2017-04-15 LAB — COMPREHENSIVE METABOLIC PANEL
ALT: 12 U/L — ABNORMAL LOW (ref 14–54)
ANION GAP: 9 (ref 5–15)
AST: 28 U/L (ref 15–41)
Albumin: 3.4 g/dL — ABNORMAL LOW (ref 3.5–5.0)
Alkaline Phosphatase: 49 U/L (ref 38–126)
BILIRUBIN TOTAL: 0.4 mg/dL (ref 0.3–1.2)
BUN: 6 mg/dL (ref 6–20)
CHLORIDE: 106 mmol/L (ref 101–111)
CO2: 23 mmol/L (ref 22–32)
Calcium: 9 mg/dL (ref 8.9–10.3)
Creatinine, Ser: 0.9 mg/dL (ref 0.44–1.00)
GFR calc Af Amer: >60 mL/min (ref >60)
GFR calc non Af Amer: >60 mL/min (ref >60)
GLUCOSE: 114 mg/dL — AB (ref 65–99)
POTASSIUM: 3.4 mmol/L — AB (ref 3.5–5.1)
SODIUM: 138 mmol/L (ref 135–145)
TOTAL PROTEIN: 6.7 g/dL (ref 6.5–8.1)

## 2017-04-15 LAB — POC URINE PREG, ED: PREG TEST UR: NEGATIVE

## 2017-04-15 LAB — LIPASE, BLOOD: LIPASE: 19 U/L (ref 11–51)

## 2017-04-15 MED ORDER — IOPAMIDOL (ISOVUE-300) INJECTION 61%
INTRAVENOUS | Status: AC
  Filled 2017-04-15: qty 100

## 2017-04-15 MED ORDER — ONDANSETRON HCL 4 MG/2ML IJ SOLN
4.0000 mg | Freq: Once | INTRAMUSCULAR | Status: AC
  Administered 2017-04-15: 4 mg via INTRAVENOUS
  Filled 2017-04-15: qty 2

## 2017-04-15 MED ORDER — SODIUM CHLORIDE 0.9 % IV BOLUS (SEPSIS)
1000.0000 mL | Freq: Once | INTRAVENOUS | Status: AC
  Administered 2017-04-15: 1000 mL via INTRAVENOUS

## 2017-04-15 MED ORDER — DICYCLOMINE HCL 20 MG PO TABS: 20.0000 mg | ORAL_TABLET | Freq: Two times a day (BID) | ORAL | 0 refills | Status: DC

## 2017-04-15 MED ORDER — ONDANSETRON 4 MG PO TBDP: 4.0000 mg | ORAL_TABLET | Freq: Three times a day (TID) | ORAL | 0 refills | Status: DC | PRN

## 2017-04-15 NOTE — ED Provider Notes (Signed)
[facility]-EMERGENCY DEPT Provider Note   CSN: [id number] Arrival date & time: 04/15/17  1528     History   Chief Complaint Chief Complaint  Patient presents with  . Abdominal Pain  . Headache    HPI Brittney Watkins is a 36 y.o. female.  HPI   Brittney Watkins is a 36 y.o. female, with a history of migraine, presenting to the ED with abdominal pain for the "last few days." Pain is generalized, dull, 8/10, nonradiating, Nothing makes the pain better. Also endorses nausea, vomiting, diarrhea. Endorses 3 episodes of emesis and at least 7 episodes of diarrhea over the past 24 hours. States she feels like her symptoms are getting worse. Also endorses a headache that has come in during her symptoms. Headache is throbbing, generalized, nonradiating. She saw her PCP on July 26 for similar symptoms, was dignosed with a UTI, and prescribed an ABX that she can't remember the name of.  Denies fever, hematochezia/melena, urinary symptoms, rashes, recent travel, or any other complaints.   Currently menstruating, beginning July 27.   Past Medical History:  Diagnosis Date  . Blurred vision   . Migraine   . UTI (urinary tract infection)     Patient Active Problem List   Diagnosis Date Noted  . Migraine 09/08/2014  . Headache 09/08/2014  . Neck pain 09/08/2014    Past Surgical History:  Procedure Laterality Date  . CHOLECYSTECTOMY  2007  . TUBAL LIGATION  2006    OB History    No data available       Home Medications    Prior to Admission medications   Medication Sig Start Date End Date Taking? Authorizing Provider  Multiple Vitamin (MULTIVITAMIN WITH MINERALS) TABS tablet Take 1 tablet by mouth at bedtime.    Yes [provider]  rizatriptan (MAXALT-MLT) 10 MG disintegrating tablet Take 1 tablet (10 mg total) by mouth as needed for migraine. May repeat in 2 hours if needed 12/05/16  Yes Nilda RiggsMartin, Nancy Carolyn, NP  busPIRone (BUSPAR) 5 MG tablet Take 1 tablet (5 mg total)  by mouth 3 (three) times daily as needed. For anxiety. Patient not taking: Reported on 04/15/2017 04/06/17   Allegra GranaArnett, Margaret G, FNP  dicyclomine (BENTYL) 20 MG tablet Take 1 tablet (20 mg total) by mouth 2 (two) times daily. 04/15/17   Shyteria Lewis C, PA-C  divalproex (DEPAKOTE ER) 500 MG 24 hr tablet Take 1 tablet (500 mg total) by mouth daily. Patient not taking: Reported on 04/15/2017 03/12/17   Nilda RiggsMartin, Nancy Carolyn, NP  ondansetron (ZOFRAN ODT) 4 MG disintegrating tablet Take 1 tablet (4 mg total) by mouth every 8 (eight) hours as needed for nausea or vomiting. 04/15/17   Anselm PancoastJoy, Larry Alcock C, PA-C    Family History Family History  Problem Relation Age of Onset  . Heart attack Father   . Diabetes Maternal Grandmother        some uncles as well   . High blood pressure Maternal Grandmother        some uncles as well  . Migraines Neg Hx     Social History Social History  Substance Use Topics  . Smoking status: Never Smoker  . Smokeless tobacco: Never Used  . Alcohol use No     Allergies   Nyquil multi-symptom [pseudoeph-doxylamine-dm-apap] and Shellfish allergy   Review of Systems Review of Systems  Constitutional: Positive for chills. Negative for fever.  Respiratory: Negative for cough and shortness of breath.   Cardiovascular: Negative for  chest pain.  Gastrointestinal: Positive for abdominal pain, diarrhea, nausea and vomiting. Negative for blood in stool.  Genitourinary: Negative for dysuria, hematuria and vaginal discharge.  Neurological: Positive for headaches.  All other systems reviewed and are negative.    Physical Exam Updated Vital Signs BP 101/70 (BP Location: Right Arm)   Pulse (!) 110   Temp 99.6 F (37.6 C) (Oral)   Resp 18   Ht 5\' 2"  (1.575 m)   Wt 56.7 kg (125 lb)   LMP 04/13/2017   SpO2 100%   BMI 22.86 kg/m   Physical Exam  Constitutional: She appears well-developed and well-nourished. No distress.  HENT:  Head: Normocephalic and atraumatic.  Eyes:  Conjunctivae are normal.  Neck: Neck supple.  Cardiovascular: Normal rate, regular rhythm, normal heart sounds and intact distal pulses.   Pulmonary/Chest: Effort normal and breath sounds normal. No respiratory distress.  Abdominal: Soft. There is generalized tenderness. There is no guarding and no CVA tenderness.  Musculoskeletal: She exhibits no edema.  Lymphadenopathy:    She has no cervical adenopathy.  Neurological: She is alert.  Skin: Skin is warm and dry. She is not diaphoretic.  Psychiatric: She has a normal mood and affect. Her behavior is normal.  Nursing note and vitals reviewed.    ED Treatments / Results  Labs (all labs ordered are listed, but only abnormal results are displayed) Labs Reviewed  COMPREHENSIVE METABOLIC PANEL - Abnormal; Notable for the following:       Result Value   Potassium 3.4 (*)    Glucose, Bld 114 (*)    Albumin 3.4 (*)    ALT 12 (*)    All other components within normal limits  CBC - Abnormal; Notable for the following:    Hemoglobin 11.3 (*)    HCT 34.4 (*)    MCH 25.7 (*)    All other components within normal limits  URINALYSIS, ROUTINE W REFLEX MICROSCOPIC - Abnormal; Notable for the following:    Color, Urine AMBER (*)    APPearance HAZY (*)    Hgb urine dipstick SMALL (*)    Ketones, ur 20 (*)    Protein, ur 30 (*)    Squamous Epithelial / LPF 6-30 (*)    All other components within normal limits  LIPASE, BLOOD  POC URINE PREG, ED    EKG  EKG Interpretation None       Radiology No results found.  Procedures Procedures (including critical care time)  Medications Ordered in ED Medications  sodium chloride 0.9 % bolus 1,000 mL (0 mLs Intravenous Stopped 04/15/17 1858)  ondansetron (ZOFRAN) injection 4 mg (4 mg Intravenous Given 04/15/17 1744)     Initial Impression / Assessment and Plan / ED Course  I have reviewed the triage vital signs and the nursing notes.  Pertinent labs & imaging results that were available  during my care of the patient were reviewed by me and considered in my medical decision making (see chart for details).  Clinical Course as of Apr 16 1947  Wynelle Link Apr 15, 2017  6962 Spoke with patient to deliver further lab results. States she is no longer in any pain. No abdominal tenderness on repeat exam. She hasn't vomited or had an episode of diarrhea since she arrived in the ED. She would rather go home than stay and have the CT.   [SJ]    Clinical Course User Index [SJ] Nabiha Planck C, PA-C    Patient presents with complaint of abdominal pain,  nausea, vomiting, and diarrhea. Patient is nontoxic appearing, afebrile, not tachycardic on my exam, not tachypneic, not hypotensive, maintains SPO2 of 99-100% on room air, and is in no apparent distress. Patient's symptoms resolved with minimal intervention and did not recur. She was able to pass an oral fluid challenge without difficulty. PCP versus GI follow-up. The patient was given instructions for home care as well as return precautions. Patient voices understanding of these instructions, accepts the plan, and is comfortable with discharge.   Vitals:   04/15/17 1700 04/15/17 1715 04/15/17 1800 04/15/17 1830  BP: 100/68  103/69 97/64  Pulse:  84 94 86  Resp:    16  Temp:      TempSrc:      SpO2:  100% 99% 99%  Weight:      Height:         Final Clinical Impressions(s) / ED Diagnoses   Final diagnoses:  Nausea vomiting and diarrhea  Dehydration    New Prescriptions Discharge Medication List as of 04/15/2017  6:48 PM    START taking these medications   Details  dicyclomine (BENTYL) 20 MG tablet Take 1 tablet (20 mg total) by mouth 2 (two) times daily., Starting Sun 04/15/2017, Print    ondansetron (ZOFRAN ODT) 4 MG disintegrating tablet Take 1 tablet (4 mg total) by mouth every 8 (eight) hours as needed for nausea or vomiting., Starting Sun 04/15/2017, Print         Micharl Helmes, Reynolds HeightsShawn C, PA-C 04/15/17 1949    Long, Arlyss RepressJoshua G,  MD 04/18/17 1504

## 2017-04-15 NOTE — ED Triage Notes (Signed)
C/o dull generalized abd pain, headache, nausea, vomiting, and chills since Thursday.  Seen by PCP on Thursday for UTI and taking antibiotic (unable to take yesterday or today due to n/v).

## 2017-04-15 NOTE — ED Notes (Signed)
Also reports diarrhea.

## 2017-04-15 NOTE — Discharge Instructions (Signed)
You have been evaluated today for nausea, vomiting, and diarrhea. There was evidence of some dehydration on your labs. They were otherwise without acute or significant abnormalities.  Your symptoms are consistent with a viral illness. Viruses do not require antibiotics. Treatment is symptomatic care and it is important to note that these symptoms may last for 7-14 days.   Hand washing: Wash your hands throughout the day, but especially before and after touching the face, using the restroom, sneezing, coughing, or touching surfaces that have been coughed or sneezed upon. Hydration: Symptoms will be intensified and complicated by dehydration. Dehydration can also extend the duration of symptoms. Drink plenty of fluids and get plenty of rest. You should be drinking at least half a liter of water an hour to stay hydrated. Electrolyte drinks are also encouraged. You should be drinking enough fluids to make your urine light yellow, almost clear. If this is not the case, you are not drinking enough water. Please note that some of the treatments indicated below will not be effective if you are not adequately hydrated. Pain or fever: Ibuprofen, Naproxen, or Tylenol for pain or fever.  Abdominal discomfort: Use the Bentyl, as needed, for abdominal cramping or discomfort. Nausea/vomiting: Use the Zofran for nausea or vomiting.  Follow up: Follow up with a primary care provider, as needed, for any future management of this issue. Return: Turn to the ED for any worsening symptoms.

## 2017-05-06 ENCOUNTER — Encounter (HOSPITAL_COMMUNITY): Payer: Self-pay | Admitting: Emergency Medicine

## 2017-05-06 ENCOUNTER — Ambulatory Visit (HOSPITAL_COMMUNITY)
Admission: EM | Admit: 2017-05-06 | Discharge: 2017-05-06 | Disposition: A | Discharge status: Home / Self Care | Payer: Medicaid Other | Attending: Family Medicine | Admitting: Family Medicine

## 2017-05-06 DIAGNOSIS — R21 Rash and other nonspecific skin eruption: Secondary | ICD-10-CM | POA: Diagnosis not present

## 2017-05-06 DIAGNOSIS — L237 Allergic contact dermatitis due to plants, except food: Secondary | ICD-10-CM

## 2017-05-06 MED ORDER — HYDROXYZINE HCL 25 MG PO TABS: 25.0000 mg | ORAL_TABLET | Freq: Four times a day (QID) | ORAL | 0 refills | Status: DC

## 2017-05-06 MED ORDER — TRIAMCINOLONE ACETONIDE 0.1 % EX CREA: 1.0000 "application " | TOPICAL_CREAM | Freq: Two times a day (BID) | CUTANEOUS | 0 refills | Status: DC

## 2017-05-06 MED ORDER — PREDNISONE 10 MG (21) PO TBPK: ORAL_TABLET | Freq: Every day | ORAL | 0 refills | Status: DC

## 2017-05-06 MED ORDER — METHYLPREDNISOLONE ACETATE 80 MG/ML IJ SUSP
INTRAMUSCULAR | Status: AC
  Filled 2017-05-06: qty 1

## 2017-05-06 MED ORDER — METHYLPREDNISOLONE ACETATE 80 MG/ML IJ SUSP
80.0000 mg | Freq: Once | INTRAMUSCULAR | Status: AC
  Administered 2017-05-06: 80 mg via INTRAMUSCULAR

## 2017-05-06 NOTE — ED Triage Notes (Signed)
Rash noticed on Monday, went to pcp.  Patient was prescribed prednisone 20 mg x 5 days.  No improvement.  No shots given.  Patient was prescribed cream.  Patient says rash is spreading

## 2017-05-06 NOTE — ED Provider Notes (Signed)
  [facility]   [id number] 05/06/17 Arrival Time: 1706   SUBJECTIVE:  Brittney Watkins is a 36 y.o. female who presents to the urgent care with complaint of pruritic, erythematous rash on both forearms. She was seen by her primary care provider 5 days ago, diagnosed with poison ivy dermatitis, prednisone 20 mg orally. She's had minimal relief, and states the rash has spread. She's been using calamine lotion with some relief. Denies any fever or chills, no new soaps or detergents, no new medicines, or other exposures.  ROS: As per HPI, remainder of ROS negative.   OBJECTIVE:  Vitals:   05/06/17 1722  BP: 110/66  Pulse: 90  Resp: 18  Temp: 99 F (37.2 C)  TempSrc: Oral  SpO2: 100%     General appearance: alert; no distress HEENT: normocephalic; atraumatic; conjunctivae normal;  Lungs: clear to auscultation bilaterally Heart: regular rate and rhythm Abdomen: soft, non-tender;  Musculoskeletal/Extremities: Pulses +2, grossly symmetrical, no dependent edema Skin: warm and dry, multiple erythematous, blistering lesions on the forearms Neurologic: Grossly normal Psychological:  alert and cooperative; normal mood and affect     ASSESSMENT & PLAN:  1. Poison ivy dermatitis     Meds ordered this encounter  Medications  . methylPREDNISolone acetate (DEPO-MEDROL) injection 80 mg  . predniSONE (STERAPRED UNI-PAK 21 TAB) 10 MG (21) TBPK tablet    Sig: Take by mouth daily. Take 6 tabs by mouth daily  for 2 days, then 5 tabs for 2 days, then 4 tabs for 2 days, then 3 tabs for 2 days, 2 tabs for 2 days, then 1 tab by mouth daily for 2 days    Dispense:  42 tablet    Refill:  0    Order Specific Question:   Supervising Provider    Answer:   Elvina Sidle [[id number]]  . hydrOXYzine (ATARAX/VISTARIL) 25 MG tablet    Sig: Take 1 tablet (25 mg total) by mouth every 6 (six) hours.    Dispense:  30 tablet    Refill:  0    Order Specific Question:   Supervising Provider   Answer:   Elvina Sidle [[id number]]  . triamcinolone cream (KENALOG) 0.1 %    Sig: Apply 1 application topically 2 (two) times daily.    Dispense:  30 g    Refill:  0    Order Specific Question:   Supervising Provider    Answer:   Elvina Sidle [[id number]]    Reviewed expectations re: course of current medical issues. Questions answered. Outlined signs and symptoms indicating need for more acute intervention. Patient verbalized understanding. After Visit Summary given.    Procedures:    Labs Reviewed - No data to display  No results found.  Allergies  Allergen Reactions  . Nyquil Multi-Symptom [Pseudoeph-Doxylamine-Dm-Apap] Swelling and Rash  . Shellfish Allergy Rash    PMHx, SurgHx, SocialHx, Medications, and Allergies were reviewed in the Visit Navigator and updated as appropriate.       Dorena Bodo, NP 05/06/17 1736

## 2017-05-06 NOTE — Discharge Instructions (Signed)
Take the medicines as directed. Return to clinic if symptoms persist past one week

## 2017-05-23 ENCOUNTER — Other Ambulatory Visit: Payer: Self-pay | Admitting: Nurse Practitioner

## 2017-06-11 NOTE — Progress Notes (Deleted)
[facility]  PATIENT: Brittney Watkins DOB: 1981/01/02   REASON FOR VISIT: Follow-up for migraine headaches HISTORY FROM: Patient    HISTORY OF PRESENT ILLNESS:UPDATE 06/25/2018CM Brittney Watkins 36 year old female returns for follow-up for history of migraine headaches for over 10 years. When last seen in March she was placed on Topamax but said she had memory loss on the medication. Her Imitrex was no longer working and she was switched to Maxalt. She is having about 1-2 headaches per week at this time. A trigger for her headaches as low blood sugar, she is on A-Vegan  Diet. Her headaches tend to be throbbing in nature usually on the right side with light sensitivity and sound sensitivity sometimes nausea but no vomiting. MRI of the brain and normal. She returns for reevaluation   UPDATE 12/05/2016 CM Brittney Watkins, 36 year old female returns for follow-up with a ten-year history of migraine headaches. She was last seen in this office December 2015. Since that time she has gotten off of her preventive medication and she claims Imitrex no longer works acutely. In the last year she has been to the emergency room 4 times for migraine cocktail. She claims she has about 3 migraines per week. A trigger for her is low blood sugar. She is on a vegan diet and takes a B complex vitamin. She has not on any birth control she had her tubes tied. Her headaches typically are on the right side throbbing pain pressure, I light sensitivity and sound sensitivity sometimes nausea no vomiting. MRI of the brain has been normal. She returns for reevaluation   HISTORY 09/08/14 Brittney Watkins is a 36 y.o. female here as a referral from Dr. Julio Sicks for headaches. pmhx elevated hgba1c, vit d deficiency, migraines, hld.  Patient has had headaches ofr 8-9 years. Started with birth control patch. Recently gtting worse. Having pain behind the eyes. Vision changes, blurry. Migraines are on the right  side. They are throbbing. Pressure behind the eye. Sometimes sharp. +light sensitivity, +sound sensitivity, +nausea. Fatigued more recently. In the last month she has had 10 headaches. If she can catch them with imitrex they last an hour but if she can't catch them in time it lasts until she gets to the ED and gets a migraine cocktail. Can last 24 hours. 10/10 pain. Imitrex works. Tried relpax which caused her to vomit. Started Topamax  twice a day. She doesn't take the topamax every day like she is supposed to. Migraines getting more frequent and more severe. No aura. Unknown triggers. No focal neurologic symptoms with the migraines.   REVIEW OF SYSTEMS: Full 14 system review of systems performed and notable only for those listed, all others are neg:  Constitutional: neg  Cardiovascular: neg Ear/Nose/Throat: neg  Skin: neg Eyes: neg Respiratory: neg Gastroitestinal: neg  Hematology/Lymphatic: neg  Endocrine: neg Musculoskeletal:neg Allergy/Immunology: neg Neurological: Migraine Psychiatric: neg Sleep : neg   ALLERGIES: Allergies  Allergen Reactions  . Nyquil Multi-Symptom [Pseudoeph-Doxylamine-Dm-Apap] Swelling and Rash  . Shellfish Allergy Rash    HOME MEDICATIONS: Outpatient Medications Prior to Visit  Medication Sig Dispense Refill  . busPIRone (BUSPAR) 5 MG tablet Take 1 tablet (5 mg total) by mouth 3 (three) times daily as needed. For anxiety. (Patient not taking: Reported on 04/15/2017) 90 tablet 0  . dicyclomine (BENTYL) 20 MG tablet Take 1 tablet (20 mg total) by mouth 2 (two) times daily. 20 tablet 0  . divalproex (DEPAKOTE ER) 500 MG 24 hr tablet Take 1 tablet (  500 mg total) by mouth daily. (Patient not taking: Reported on 04/15/2017) 30 tablet 4  . hydrOXYzine (ATARAX/VISTARIL) 25 MG tablet Take 1 tablet (25 mg total) by mouth every 6 (six) hours. 30 tablet 0  . Multiple Vitamin (MULTIVITAMIN WITH MINERALS) TABS tablet Take 1 tablet by mouth at bedtime.     .  ondansetron (ZOFRAN ODT) 4 MG disintegrating tablet Take 1 tablet (4 mg total) by mouth every 8 (eight) hours as needed for nausea or vomiting. 20 tablet 0  . predniSONE (STERAPRED UNI-PAK 21 TAB) 10 MG (21) TBPK tablet Take by mouth daily. Take 6 tabs by mouth daily  for 2 days, then 5 tabs for 2 days, then 4 tabs for 2 days, then 3 tabs for 2 days, 2 tabs for 2 days, then 1 tab by mouth daily for 2 days 42 tablet 0  . rizatriptan (MAXALT-MLT) 10 MG disintegrating tablet DISSOLVE ONE TABLET BY MOUTH AS NEEDED FOR MIGRAINE. MAY REPEAT IN 2 HOURS IF NEEDED 10 tablet 0  . triamcinolone cream (KENALOG) 0.1 % Apply 1 application topically 2 (two) times daily. 30 g 0   Facility-Administered Medications Prior to Visit  Medication Dose Route Frequency Provider Last Rate Last Dose  . methylPREDNISolone sodium succinate (SOLU-MEDROL) 500 mg in sodium chloride 0.9 % 100 mL IVPB  500 mg Intravenous Continuous Naomie Dean B, MD   500 mg at 11/26/14 1653  . valproate (DEPACON) 1,000 mg in sodium chloride 0.9 % 100 mL IVPB  1,000 mg Intravenous Continuous Naomie Dean B, MD 110 mL/hr at 11/26/14 1630 1,000 mg at 11/26/14 1630    PAST MEDICAL HISTORY: Past Medical History:  Diagnosis Date  . Blurred vision   . Migraine   . UTI (urinary tract infection)     PAST SURGICAL HISTORY: Past Surgical History:  Procedure Laterality Date  . CHOLECYSTECTOMY  2007  . TUBAL LIGATION  2006    FAMILY HISTORY: Family History  Problem Relation Age of Onset  . Heart attack Father   . Diabetes Maternal Grandmother        some uncles as well   . High blood pressure Maternal Grandmother        some uncles as well  . Migraines Neg Hx     SOCIAL HISTORY: Social History   Social History  . Marital status: Single    Spouse name: N/A  . Number of children: 2  . Years of education: 12+   Occupational History  . Not on file.   Social History Main Topics  . Smoking status: Never Smoker  . Smokeless  tobacco: Never Used  . Alcohol use No  . Drug use: No  . Sexual activity: Yes    Birth control/ protection: Surgical   Other Topics Concern  . Not on file   Social History Narrative   Patient lives at home with children and their father  Shaunee Mulkern    Patient has 2 children    Patient is a Archivist    Patient works for [employer]    Patient is right handed         PHYSICAL EXAM  There were no vitals filed for this visit. There is no height or weight on file to calculate BMI.  Generalized: Well developed, in no acute distress  Head: normocephalic and atraumatic,. Oropharynx benign  Neck: Supple,  Musculoskeletal: No deformity   Neurological examination   Mentation: Alert oriented to time, place, history taking. Attention span and concentration  appropriate. Recent and remote memory intact.  Follows all commands speech and language fluent.   Cranial nerve II-XII: Pupils were equal round reactive to light extraocular movements were full, visual field were full on confrontational test. Facial sensation and strength were normal. hearing was intact to finger rubbing bilaterally. Uvula tongue midline. head turning and shoulder shrug were normal and symmetric.Tongue protrusion into cheek strength was normal. Motor: normal bulk and tone, full strength in the BUE, BLE, fine finger movements normal, no pronator drift. No focal weakness Sensory: normal and symmetric to light touch, pinprick, and  Vibration, in the upper and lower extremities  Coordination: finger-nose-finger, heel-to-shin bilaterally, no dysmetria, no tremor Reflexes: Brachioradialis 2/2, biceps 2/2, triceps 2/2, patellar 2/2, Achilles 2/2, plantar responses were flexor bilaterally. Gait and Station: Rising up from seated position without assistance, normal stance,  moderate stride, good arm swing, smooth turning, able to perform tiptoe, and heel walking without difficulty. Tandem gait is  steady  DIAGNOSTIC DATA (LABS, IMAGING, TESTING) - I reviewed patient records, labs, notes, testing and imaging myself where available.  Lab Results  Component Value Date   WBC 5.1 04/15/2017   HGB 11.3 (L) 04/15/2017   HCT 34.4 (L) 04/15/2017   MCV 78.4 04/15/2017   PLT 151 04/15/2017      Component Value Date/Time   NA 138 04/15/2017 1536   NA 139 11/26/2014 1557   K 3.4 (L) 04/15/2017 1536   CL 106 04/15/2017 1536   CO2 23 04/15/2017 1536   GLUCOSE 114 (H) 04/15/2017 1536   BUN 6 04/15/2017 1536   BUN 12 11/26/2014 1557   CREATININE 0.90 04/15/2017 1536   CALCIUM 9.0 04/15/2017 1536   PROT 6.7 04/15/2017 1536   PROT 6.8 11/26/2014 1557   ALBUMIN 3.4 (L) 04/15/2017 1536   ALBUMIN 4.1 11/26/2014 1557   AST 28 04/15/2017 1536   ALT 12 (L) 04/15/2017 1536   ALKPHOS 49 04/15/2017 1536   BILITOT 0.4 04/15/2017 1536   BILITOT 0.2 11/26/2014 1557   GFRNONAA >60 04/15/2017 1536   GFRAA >60 04/15/2017 1536    ASSESSMENT AND PLAN  36 y.o. year old female  has a past medical history of Blurred vision; Migraine; and UTI (urinary tract infection). here  to follow-up for migraine headaches. She was placed on Topamax at her last visit had memory loss on the medication. Imitrex is no longer working.   PLAN: Begin Depakote ER  at night reviewed most frequent side effects of the drug with the pt and given copy Maxalt melt acutely for headache/migraine Keep a record of your headaches on Migraine tracker APP Reviewed migraine triggers, she can get a headache with low blood sugar patient is on a vegan diet F/U in 3 months I spent 25 minutes in total face to face time with the patient more than 50% of which was spent counseling and coordination of care,  reviewing medications and discussing and reviewing the diagnosis of migraine and further treatment options. Importance of keeping a diary if headaches worsen to include the time of the headache what you're doing any other specific  information that would be useful. Discussed stress relief techniques such as deep breathing muscle relaxation mental relaxation to music. Discussed importance of exercise, regular meals  and sleep. Sleep deprivation can be a migraine trigger.  Nilda Riggs, [facility], [facility], APRN  [facility] [address], [address] [place], [place] [address] [phone]

## 2017-06-12 ENCOUNTER — Ambulatory Visit: Payer: Medicaid Other | Admitting: Nurse Practitioner

## 2017-06-13 ENCOUNTER — Encounter: Payer: Self-pay | Admitting: Nurse Practitioner

## 2017-07-02 ENCOUNTER — Other Ambulatory Visit: Payer: Self-pay | Admitting: Nurse Practitioner

## 2017-08-15 ENCOUNTER — Other Ambulatory Visit: Payer: Self-pay | Admitting: Nurse Practitioner

## 2017-10-01 IMAGING — US US ART/VEN ABD/PELV/SCROTUM DOPPLER LTD
1 series · 13 of 25 positions shown · non-contrast
Comparison: None.

CLINICAL DATA: Left lower quadrant/ adnexal region tenderness for 2
days.

EXAM:
TRANSABDOMINAL AND TRANSVAGINAL ULTRASOUND OF PELVIS
DOPPLER ULTRASOUND OF OVARIES
TECHNIQUE: Both transabdominal and transvaginal ultrasound examinations of the
pelvis were performed. Transabdominal technique was performed for
global imaging of the pelvis including uterus, ovaries, adnexal
regions, and pelvic cul-de-sac.
It was necessary to proceed with endovaginal exam following the
transabdominal exam to visualize the endometrium and ovaries to
better advantage. Color and duplex Doppler ultrasound was utilized
to evaluate blood flow to the ovaries.

[Series 1: us art/ven abd/pelv/scrotum doppler ltd · 0.21mm/px · 13 of 75 slices shown]
[im 1/75]
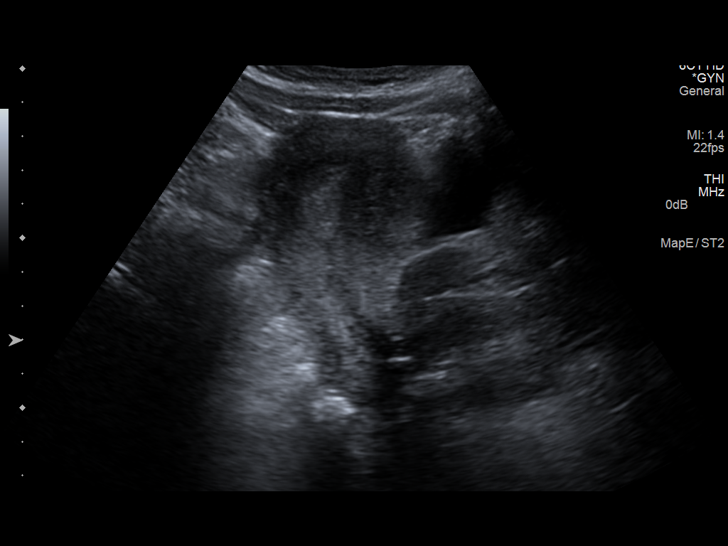
[im 7/75]
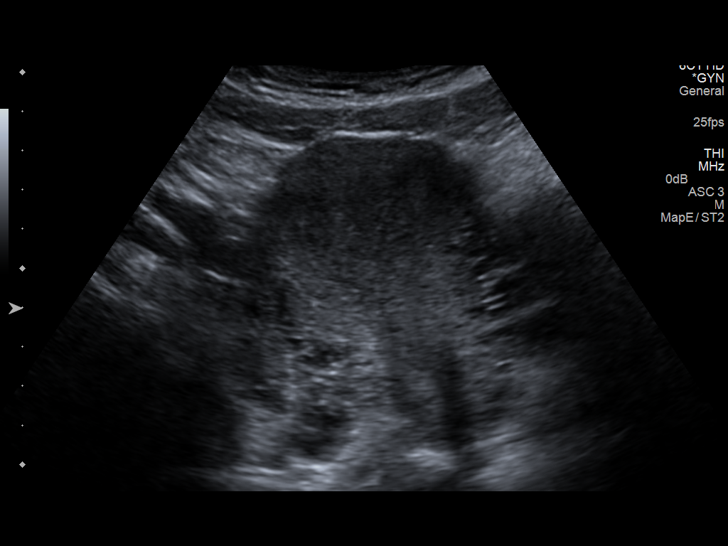
[im 13/75]
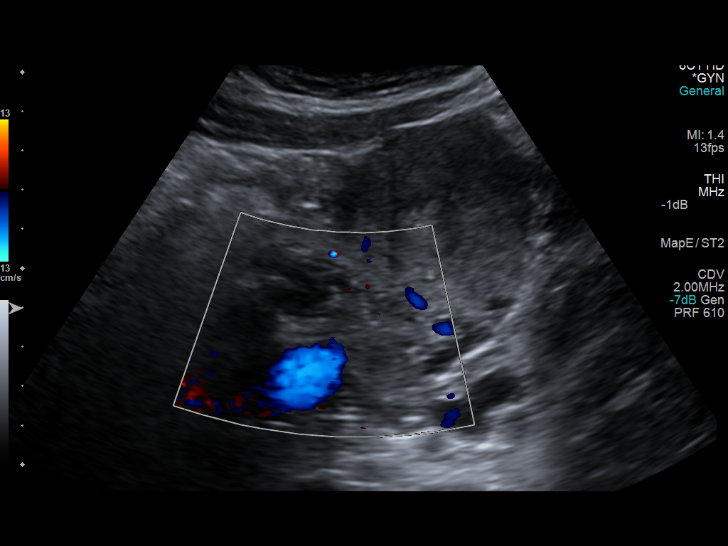
[im 19/75]
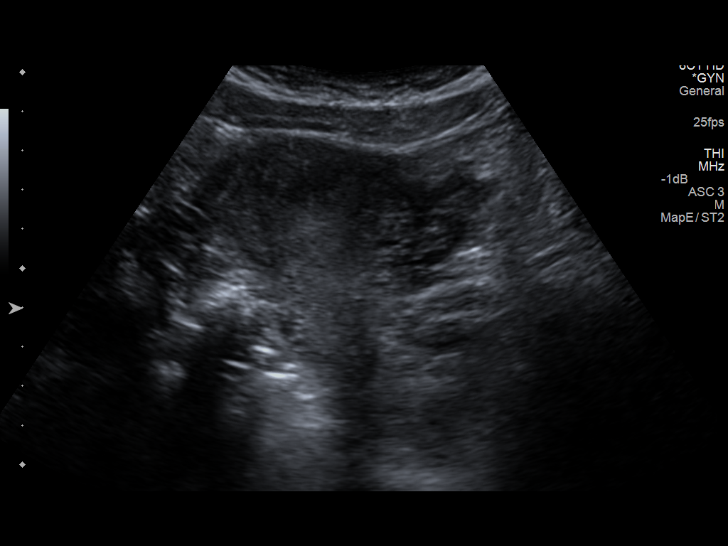
[im 25/75]
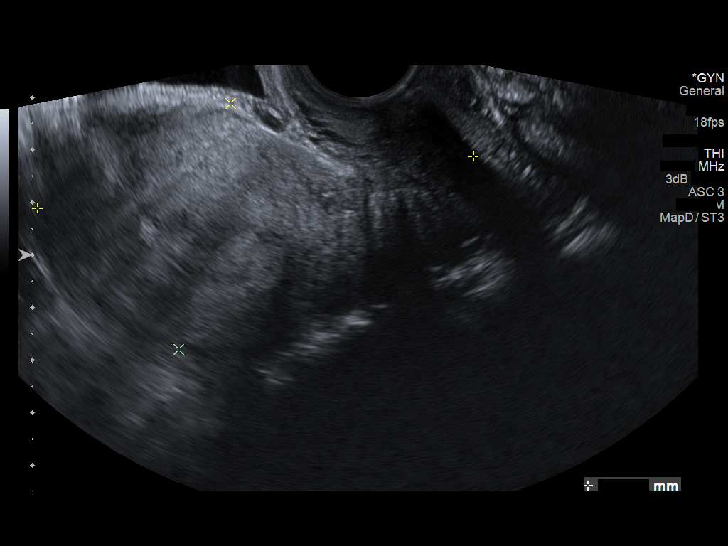
[im 31/75]
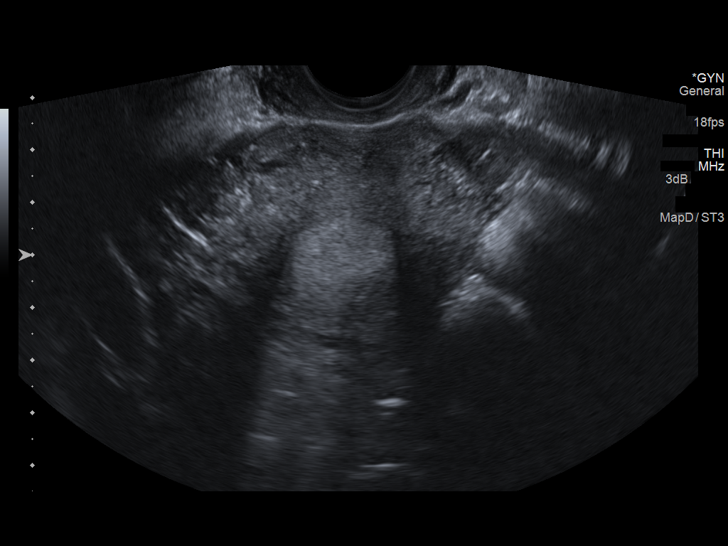
[im 38/75]
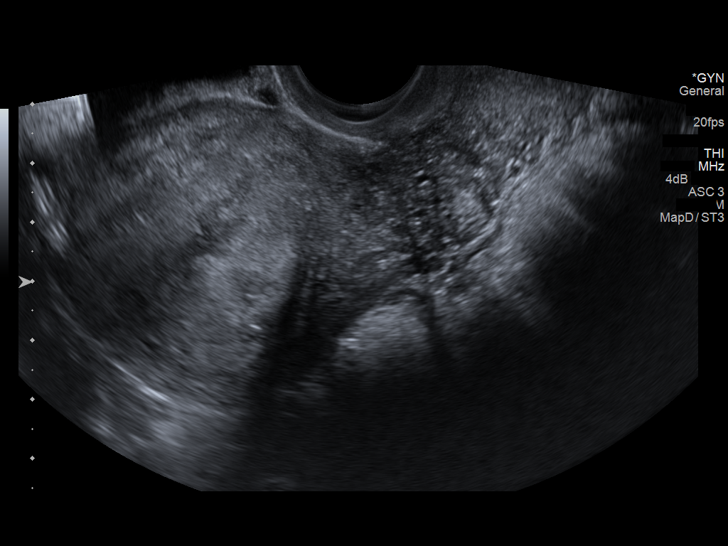
[im 44/75]
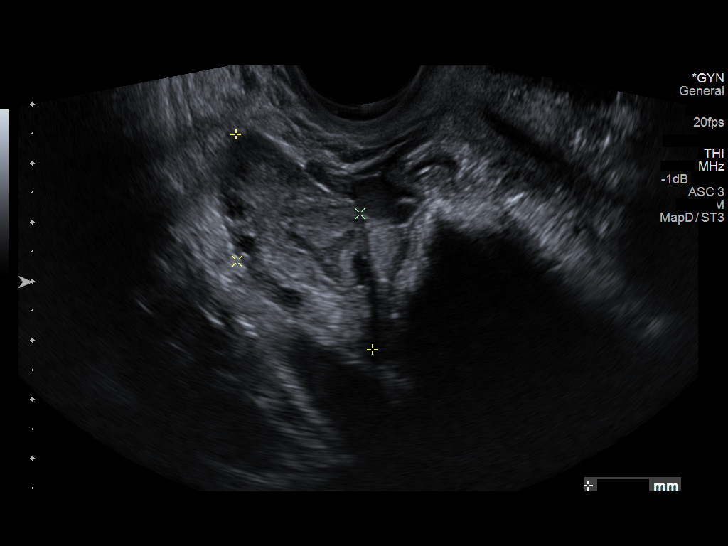
[im 50/75]
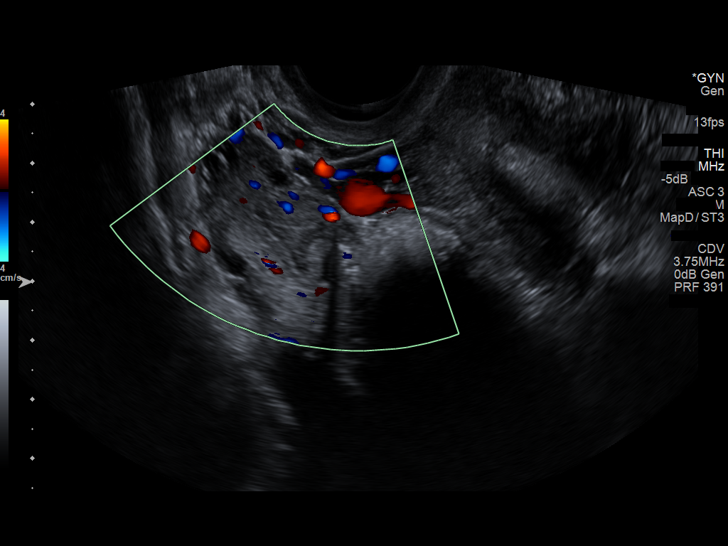
[im 56/75]
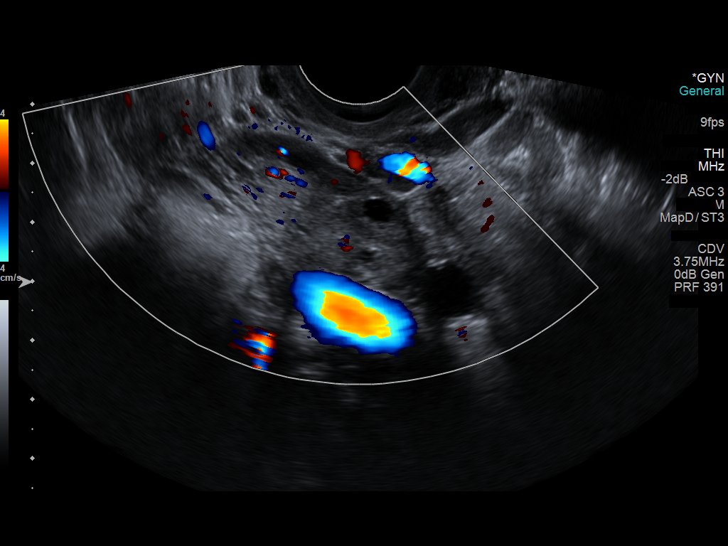
[im 62/75]
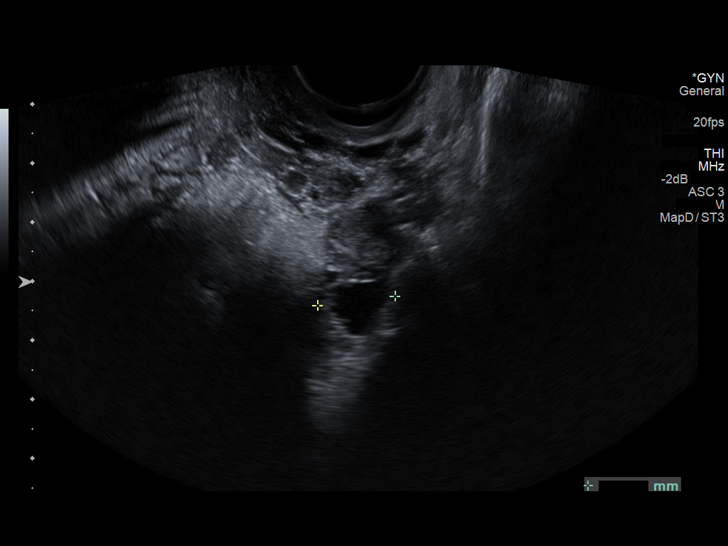
[im 68/75]
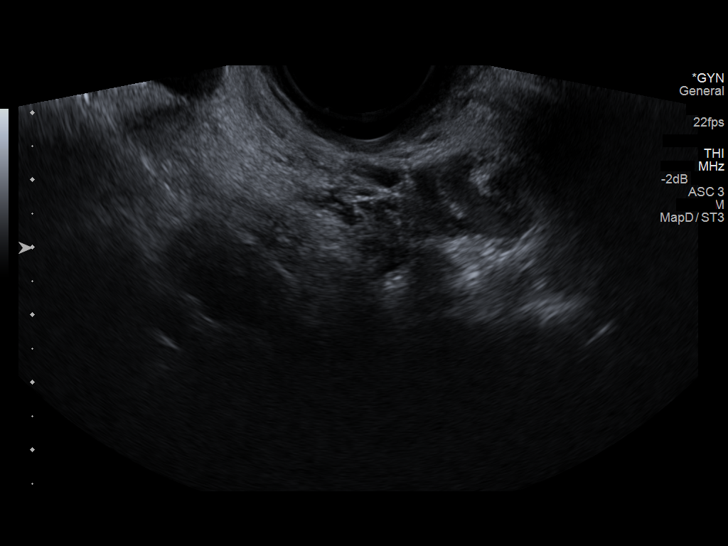
[im 75/75]
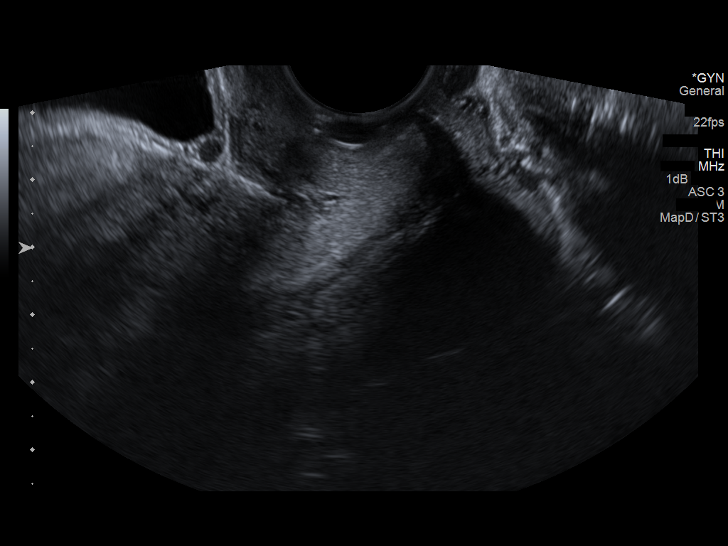

[13 of 25 positions shown; findings below may reference images not displayed]

FINDINGS: Uterus

Measurements: 7.5 x 5.1 x 5.4 cm. No fibroids or other mass
visualized.

Endometrium

Thickness: 14 mm.  No focal abnormality visualized.

Right ovary

Measurements: 4.3 x 2.2 x 2.5 cm. Probable complex cyst likely an
involuting corpus luteum measuring 2.4 cm. Ovary otherwise
unremarkable. No adnexal masses.

Left ovary

Measurements: 5.0 x 2.4 x 3.0 cm. Normal appearance/no adnexal mass.

Pulsed Doppler evaluation of both ovaries demonstrates normal
low-resistance arterial and venous waveforms.

Other findings

No free fluid.
IMPRESSION: Normal transabdominal and endovaginal pelvic ultrasound. No evidence
of ovarian torsion. No findings to explain left lower
quadrant/adnexal region pain.

## 2017-10-02 ENCOUNTER — Other Ambulatory Visit: Payer: Self-pay | Admitting: Neurology

## 2017-10-12 ENCOUNTER — Telehealth: Payer: Self-pay | Admitting: Nurse Practitioner

## 2017-10-12 MED ORDER — DIVALPROEX SODIUM ER 500 MG PO TB24: 500.0000 mg | ORAL_TABLET | Freq: Every day | ORAL | 0 refills | Status: DC

## 2017-10-12 MED ORDER — RIZATRIPTAN BENZOATE 10 MG PO TBDP: ORAL_TABLET | ORAL | 0 refills | Status: DC

## 2017-10-12 NOTE — Telephone Encounter (Signed)
LVM informing patient that the NP will refill her migraine preventative, Depakote and rescue med, Rizatriptan, however she must schedule a follow up. Advised her this office closes at noon today, left number and advised she call to schedule FU.

## 2017-10-12 NOTE — Telephone Encounter (Signed)
Refill both she needs a follow-up appointment.  She is on [facility] patient.  She may be seen to be able to be seen by her sooner than me, but can refill till appt

## 2017-10-12 NOTE — Addendum Note (Signed)
Addended by: [place] PinkHESSON, MARY C on: 10/12/2017 11:48 AM   Modules accepted: Orders

## 2017-10-12 NOTE — Telephone Encounter (Signed)
Ppatient returned call. Scheduled her for FU next Tues with NP, advised she must arrive 30 min early, bring updated med list. Advised her the refills for one month's supply will be sent today for her to pick up later. Advised her of cancellation/no show policy. She verbalized understanding, appreciation.

## 2017-10-12 NOTE — Telephone Encounter (Signed)
Spoke with patient who stated she ran out rizatriptan and Depakote. She has taken Excedrin migraine x 2 tabs for this migraine, no other OTC meds. She stated that she forget about her follow up last September (was a no show). She describes her migraine as on the right side with a little nausea and light sensitivity. This RN asked if a  low blood sugar could be her issue, and she stated that in the past few months she thinks her menstrual cycle has been the trigger of migraines.  Patient stated she has received infusion in this office before. This RN advised will route her request for migraine infusion to C. Daphine DeutscherMartin and call her back.

## 2017-10-12 NOTE — Telephone Encounter (Signed)
Pt called she has a migraine that started last night. The  rizatriptan (MAXALT-MLT) 10 MG disintegrating tablet was denied bc she needed an appt. Pt is aware she was to be seen in from last OV. Pt is wanting to come in today for migraine cocktail. Pt is aware the clinic closes at noon. Please call to advise

## 2017-10-15 NOTE — Progress Notes (Deleted)
[facility]  PATIENT: Brittney Watkins DOB: 1981/01/02   REASON FOR VISIT: Follow-up for migraine headaches HISTORY FROM: Patient    HISTORY OF PRESENT ILLNESS:UPDATE 06/25/2018CM Brittney Watkins 37 year old female returns for follow-up for history of migraine headaches for over 10 years. When last seen in March she was placed on Topamax but said she had memory loss on the medication. Her Imitrex was no longer working and she was switched to Maxalt. She is having about 1-2 headaches per week at this time. A trigger for her headaches as low blood sugar, she is on A-Vegan  Diet. Her headaches tend to be throbbing in nature usually on the right side with light sensitivity and sound sensitivity sometimes nausea but no vomiting. MRI of the brain and normal. She returns for reevaluation   UPDATE 12/05/2016 CM Brittney Watkins, 37 year old female returns for follow-up with a ten-year history of migraine headaches. She was last seen in this office December 2015. Since that time she has gotten off of her preventive medication and she claims Imitrex no longer works acutely. In the last year she has been to the emergency room 4 times for migraine cocktail. She claims she has about 3 migraines per week. A trigger for her is low blood sugar. She is on a vegan diet and takes a B complex vitamin. She has not on any birth control she had her tubes tied. Her headaches typically are on the right side throbbing pain pressure, I light sensitivity and sound sensitivity sometimes nausea no vomiting. MRI of the brain has been normal. She returns for reevaluation   HISTORY 09/08/14 Brittney Watkins is a 37 y.o. female here as a referral from Dr. Julio Sicks for headaches. pmhx elevated hgba1c, vit d deficiency, migraines, hld.  Patient has had headaches ofr 8-9 years. Started with birth control patch. Recently gtting worse. Having pain behind the eyes. Vision changes, blurry. Migraines are on the right  side. They are throbbing. Pressure behind the eye. Sometimes sharp. +light sensitivity, +sound sensitivity, +nausea. Fatigued more recently. In the last month she has had 10 headaches. If she can catch them with imitrex they last an hour but if she can't catch them in time it lasts until she gets to the ED and gets a migraine cocktail. Can last 24 hours. 10/10 pain. Imitrex works. Tried relpax which caused her to vomit. Started Topamax  twice a day. She doesn't take the topamax every day like she is supposed to. Migraines getting more frequent and more severe. No aura. Unknown triggers. No focal neurologic symptoms with the migraines.   REVIEW OF SYSTEMS: Full 14 system review of systems performed and notable only for those listed, all others are neg:  Constitutional: neg  Cardiovascular: neg Ear/Nose/Throat: neg  Skin: neg Eyes: neg Respiratory: neg Gastroitestinal: neg  Hematology/Lymphatic: neg  Endocrine: neg Musculoskeletal:neg Allergy/Immunology: neg Neurological: Migraine Psychiatric: neg Sleep : neg   ALLERGIES: Allergies  Allergen Reactions  . Nyquil Multi-Symptom [Pseudoeph-Doxylamine-Dm-Apap] Swelling and Rash  . Shellfish Allergy Rash    HOME MEDICATIONS: Outpatient Medications Prior to Visit  Medication Sig Dispense Refill  . busPIRone (BUSPAR) 5 MG tablet Take 1 tablet (5 mg total) by mouth 3 (three) times daily as needed. For anxiety. (Patient not taking: Reported on 04/15/2017) 90 tablet 0  . dicyclomine (BENTYL) 20 MG tablet Take 1 tablet (20 mg total) by mouth 2 (two) times daily. 20 tablet 0  . divalproex (DEPAKOTE ER) 500 MG 24 hr tablet Take 1 tablet (  500 mg total) by mouth daily. 30 tablet 0  . hydrOXYzine (ATARAX/VISTARIL) 25 MG tablet Take 1 tablet (25 mg total) by mouth every 6 (six) hours. 30 tablet 0  . Multiple Vitamin (MULTIVITAMIN WITH MINERALS) TABS tablet Take 1 tablet by mouth at bedtime.     . ondansetron (ZOFRAN ODT) 4 MG disintegrating tablet  Take 1 tablet (4 mg total) by mouth every 8 (eight) hours as needed for nausea or vomiting. 20 tablet 0  . predniSONE (STERAPRED UNI-PAK 21 TAB) 10 MG (21) TBPK tablet Take by mouth daily. Take 6 tabs by mouth daily  for 2 days, then 5 tabs for 2 days, then 4 tabs for 2 days, then 3 tabs for 2 days, 2 tabs for 2 days, then 1 tab by mouth daily for 2 days 42 tablet 0  . rizatriptan (MAXALT-MLT) 10 MG disintegrating tablet DISSOLVE ONE TABLET BY MOUTH AS NEEDED FOR MIGRAINE. MAY REPEAT IN 2 HOURS IF NEEDED x 1 10 tablet 0  . triamcinolone cream (KENALOG) 0.1 % Apply 1 application topically 2 (two) times daily. 30 g 0   Facility-Administered Medications Prior to Visit  Medication Dose Route Frequency Provider Last Rate Last Dose  . methylPREDNISolone sodium succinate (SOLU-MEDROL) 500 mg in sodium chloride 0.9 % 100 mL IVPB  500 mg Intravenous Continuous Naomie DeanAhern, Antonia B, MD   500 mg at 11/26/14 1653  . valproate (DEPACON) 1,000 mg in sodium chloride 0.9 % 100 mL IVPB  1,000 mg Intravenous Continuous Naomie DeanAhern, Antonia B, MD 110 mL/hr at 11/26/14 1630 1,000 mg at 11/26/14 1630    PAST MEDICAL HISTORY: Past Medical History:  Diagnosis Date  . Blurred vision   . Migraine   . UTI (urinary tract infection)     PAST SURGICAL HISTORY: Past Surgical History:  Procedure Laterality Date  . CHOLECYSTECTOMY  2007  . TUBAL LIGATION  2006    FAMILY HISTORY: Family History  Problem Relation Age of Onset  . Heart attack Father   . Diabetes Maternal Grandmother        some uncles as well   . High blood pressure Maternal Grandmother        some uncles as well  . Migraines Neg Hx     SOCIAL HISTORY: Social History   Socioeconomic History  . Marital status: Single    Spouse name: Not on file  . Number of children: 2  . Years of education: 12+  . Highest education level: Not on file  Social Needs  . Financial resource strain: Not on file  . Food insecurity - worry: Not on file  . Food  insecurity - inability: Not on file  . Transportation needs - medical: Not on file  . Transportation needs - non-medical: Not on file  Occupational History  . Not on file  Tobacco Use  . Smoking status: Never Smoker  . Smokeless tobacco: Never Used  Substance and Sexual Activity  . Alcohol use: No    Alcohol/week: 0.0 oz  . Drug use: No  . Sexual activity: Yes    Birth control/protection: Surgical  Other Topics Concern  . Not on file  Social History Narrative   Patient lives at home with children and their father  Verlin DikeSherrell Hamza    Patient has 2 children    Patient is a Archivistcollege student    [place] [employer] [place]    Patient is right handed      PHYSICAL EXAM  There were no vitals filed  for this visit. There is no height or weight on file to calculate BMI.  Generalized: Well developed, in no acute distress  Head: normocephalic and atraumatic,. Oropharynx benign  Neck: Supple,  Musculoskeletal: No deformity   Neurological examination   Mentation: Alert oriented to time, place, history taking. Attention span and concentration appropriate. Recent and remote memory intact.  Follows all commands speech and language fluent.   Cranial nerve II-XII: Pupils were equal round reactive to light extraocular movements were full, visual field were full on confrontational test. Facial sensation and strength were normal. hearing was intact to finger rubbing bilaterally. Uvula tongue midline. head turning and shoulder shrug were normal and symmetric.Tongue protrusion into cheek strength was normal. Motor: normal bulk and tone, full strength in the BUE, BLE, fine finger movements normal, no pronator drift. No focal weakness Sensory: normal and symmetric to light touch, pinprick, and  Vibration, in the upper and lower extremities  Coordination: finger-nose-finger, heel-to-shin bilaterally, no dysmetria, no tremor Reflexes: Brachioradialis 2/2, biceps 2/2, triceps 2/2, patellar 2/2,  Achilles 2/2, plantar responses were flexor bilaterally. Gait and Station: Rising up from seated position without assistance, normal stance,  moderate stride, good arm swing, smooth turning, able to perform tiptoe, and heel walking without difficulty. Tandem gait is steady  DIAGNOSTIC DATA (LABS, IMAGING, TESTING) - I reviewed patient records, labs, notes, testing and imaging myself where available.  Lab Results  Component Value Date   WBC 5.1 04/15/2017   HGB 11.3 (L) 04/15/2017   HCT 34.4 (L) 04/15/2017   MCV 78.4 04/15/2017   PLT 151 04/15/2017      Component Value Date/Time   NA 138 04/15/2017 1536   NA 139 11/26/2014 1557   K 3.4 (L) 04/15/2017 1536   CL 106 04/15/2017 1536   CO2 23 04/15/2017 1536   GLUCOSE 114 (H) 04/15/2017 1536   BUN 6 04/15/2017 1536   BUN 12 11/26/2014 1557   CREATININE 0.90 04/15/2017 1536   CALCIUM 9.0 04/15/2017 1536   PROT 6.7 04/15/2017 1536   PROT 6.8 11/26/2014 1557   ALBUMIN 3.4 (L) 04/15/2017 1536   ALBUMIN 4.1 11/26/2014 1557   AST 28 04/15/2017 1536   ALT 12 (L) 04/15/2017 1536   ALKPHOS 49 04/15/2017 1536   BILITOT 0.4 04/15/2017 1536   BILITOT 0.2 11/26/2014 1557   GFRNONAA >60 04/15/2017 1536   GFRAA >60 04/15/2017 1536    ASSESSMENT AND PLAN  37 y.o. year old female  has a past medical history of Blurred vision, Migraine, and UTI (urinary tract infection). here  to follow-up for migraine headaches. She was placed on Topamax at her last visit had memory loss on the medication. Imitrex is no longer working.   PLAN: Begin Depakote ER 500mg  at night reviewed most frequent side effects of the drug with the pt and given copy Maxalt melt acutely for headache/migraine Keep a record of your headaches on Migraine tracker APP Reviewed migraine triggers, she can get a headache with low blood sugar patient is on a vegan diet F/U in 3 months I spent 25 minutes in total face to face time with the patient more than 50% of which was spent  counseling and coordination of care,  reviewing medications and discussing and reviewing the diagnosis of migraine and further treatment options. Importance of keeping a diary if headaches worsen to include the time of the headache what you're doing any other specific information that would be useful. Discussed stress relief techniques such as deep breathing muscle  relaxation mental relaxation to music. Discussed importance of exercise, regular meals  and sleep. Sleep deprivation can be a migraine trigger.  Nilda Riggs, [facility], [facility], APRN  [facility] [address] [place], [place] [address] [phone]

## 2017-10-16 ENCOUNTER — Ambulatory Visit: Payer: Self-pay | Admitting: Nurse Practitioner

## 2017-10-16 ENCOUNTER — Telehealth: Payer: Self-pay | Admitting: *Deleted

## 2017-10-16 NOTE — Telephone Encounter (Signed)
Patient was no show for FU today with NP. 

## 2017-10-17 ENCOUNTER — Encounter: Payer: Self-pay | Admitting: Nurse Practitioner

## 2017-10-25 DIAGNOSIS — L700 Acne vulgaris: Secondary | ICD-10-CM | POA: Diagnosis not present

## 2017-10-25 DIAGNOSIS — L810 Postinflammatory hyperpigmentation: Secondary | ICD-10-CM | POA: Diagnosis not present

## 2017-11-04 ENCOUNTER — Other Ambulatory Visit: Payer: Self-pay | Admitting: Neurology

## 2017-11-29 NOTE — Telephone Encounter (Signed)
Pt has no showed 3 appts (02/06/17, kept 03-12-17 appt, then no showed 06-12-17, and 10-16-17, these with CM/NP.

## 2017-11-29 NOTE — Telephone Encounter (Signed)
Yes, please dismiss thank you

## 2017-12-03 NOTE — Telephone Encounter (Signed)
This pt will be dismissed due mutliple no show appts.

## 2017-12-04 ENCOUNTER — Encounter: Payer: Self-pay | Admitting: Neurology

## 2018-02-14 ENCOUNTER — Ambulatory Visit (HOSPITAL_COMMUNITY)
Admission: EM | Admit: 2018-02-14 | Discharge: 2018-02-14 | Disposition: A | Discharge status: Home / Self Care | Payer: Medicaid Other | Attending: Family Medicine | Admitting: Family Medicine

## 2018-02-14 ENCOUNTER — Encounter (HOSPITAL_COMMUNITY): Payer: Self-pay | Admitting: Family Medicine

## 2018-02-14 DIAGNOSIS — H01005 Unspecified blepharitis left lower eyelid: Secondary | ICD-10-CM

## 2018-02-14 DIAGNOSIS — H01002 Unspecified blepharitis right lower eyelid: Secondary | ICD-10-CM | POA: Diagnosis not present

## 2018-02-14 MED ORDER — POLYETHYL GLYCOL-PROPYL GLYCOL 0.4-0.3 % OP GEL: 1.0000 "application " | Freq: Every evening | OPHTHALMIC | 0 refills | Status: DC | PRN

## 2018-02-14 NOTE — ED Provider Notes (Signed)
[facility]    CSN: [id number] Arrival date & time: 02/14/18  1929     History   Chief Complaint Chief Complaint  Patient presents with  . Eye Problem    HPI Brittney Watkins is a 37 y.o. female.   37 year old female comes in with 1 day history of left lower eyelid swelling and itching.  States  had some crusting on the eye when she first woke up in the morning.  Denies eye redness, pain, vision changes, photophobia.  Denies recent make-up use, new exposures to hygiene products.  Does have sneezing without rhinorrhea, nasal congestion.  Denies fever, chills, night sweats.  Prior contact lens use, last used few months ago.  Has not tried anything for the symptoms.     Past Medical History:  Diagnosis Date  . Blurred vision   . Migraine   . UTI (urinary tract infection)     Patient Active Problem List   Diagnosis Date Noted  . Migraine 09/08/2014  . Headache 09/08/2014  . Neck pain 09/08/2014    Past Surgical History:  Procedure Laterality Date  . CHOLECYSTECTOMY  2007  . TUBAL LIGATION  2006    OB History   None      Home Medications    Prior to Admission medications   Medication Sig Start Date End Date Taking? Authorizing Provider  Multiple Vitamin (MULTIVITAMIN WITH MINERALS) TABS tablet Take 1 tablet by mouth at bedtime.     [provider]  Polyethyl Glycol-Propyl Glycol (SYSTANE) 0.4-0.3 % GEL ophthalmic gel Place 1 application into both eyes at bedtime as needed. 02/14/18   Belinda Fisher, PA-C    Family History Family History  Problem Relation Age of Onset  . Heart attack Father   . Diabetes Maternal Grandmother        some uncles as well   . High blood pressure Maternal Grandmother        some uncles as well  . Migraines Neg Hx     Social History Social History   Tobacco Use  . Smoking status: Never Smoker  . Smokeless tobacco: Never Used  Substance Use Topics  . Alcohol use: No    Alcohol/week: 0.0 oz  . Drug use: No      Allergies   Nyquil multi-symptom [pseudoeph-doxylamine-dm-apap] and Shellfish allergy   Review of Systems Review of Systems  Reason unable to perform ROS: See HPI as above.     Physical Exam Triage Vital Signs ED Triage Vitals  Enc Vitals Group     BP 02/14/18 1946 104/64     Pulse Rate 02/14/18 1946 80     Resp 02/14/18 1946 18     Temp 02/14/18 1946 98.4 F (36.9 C)     Temp src --      SpO2 02/14/18 1946 100 %     Weight --      Height --      Head Circumference --      Peak Flow --      Pain Score 02/14/18 1943 0     Pain Loc --      Pain Edu? --      Excl. in GC? --    No data found.  Updated Vital Signs BP 104/64   Pulse 80   Temp 98.4 F (36.9 C)   Resp 18   LMP 01/31/2018   SpO2 100%   Visual Acuity Right Eye Distance:   20/20 (CC) Left Eye Distance:  20/20 (CC) Bilateral Distance:    Right Eye Near:   Left Eye Near:    Bilateral Near:     Physical Exam  Constitutional: She is oriented to person, place, and time. She appears well-developed and well-nourished. No distress.  HENT:  Head: Normocephalic and atraumatic.  Right Ear: Tympanic membrane, external ear and ear canal normal. Tympanic membrane is not erythematous and not bulging.  Left Ear: Tympanic membrane, external ear and ear canal normal. Tympanic membrane is not erythematous and not bulging.  Nose: Nose normal. Right sinus exhibits no maxillary sinus tenderness and no frontal sinus tenderness. Left sinus exhibits no maxillary sinus tenderness and no frontal sinus tenderness.  Mouth/Throat: Uvula is midline, oropharynx is clear and moist and mucous membranes are normal.  Eyes: Pupils are equal, round, and reactive to light. Conjunctivae and EOM are normal.  Bilateral lower eyelid swelling, L>R. No erythema, increased warmth. No tenderness to palpation.   Neck: Normal range of motion. Neck supple.  Cardiovascular: Normal rate, regular rhythm and normal heart sounds. Exam reveals no  gallop and no friction rub.  No murmur heard. Pulmonary/Chest: Effort normal and breath sounds normal. She has no decreased breath sounds. She has no wheezes. She has no rhonchi. She has no rales.  Lymphadenopathy:    She has no cervical adenopathy.  Neurological: She is alert and oriented to person, place, and time.  Skin: Skin is warm and dry.  Psychiatric: She has a normal mood and affect. Her behavior is normal. Judgment normal.     UC Treatments / Results  Labs (all labs ordered are listed, but only abnormal results are displayed) Labs Reviewed - No data to display  EKG None  Radiology No results found.  Procedures Procedures (including critical care time)  Medications Ordered in UC Medications - No data to display  Initial Impression / Assessment and Plan / UC Course  I have reviewed the triage vital signs and the nursing notes.  Pertinent labs & imaging results that were available during my care of the patient were reviewed by me and considered in my medical decision making (see chart for details).    No signs of conjunctivitis.  Will treat for blepharitis.  Lid scrubs, warm compress as directed.  Artificial tear gel as directed.  Return precautions given.  Patient expresses understanding and agrees to plan.  Final Clinical Impressions(s) / UC Diagnoses   Final diagnoses:  Blepharitis of lower eyelids of both eyes, unspecified type   ED Prescriptions    Medication Sig Dispense Auth. Provider   Polyethyl Glycol-Propyl Glycol (SYSTANE) 0.4-0.3 % GEL ophthalmic gel Place 1 application into both eyes at bedtime as needed. 1 Bottle Threasa Alpha, [place] 02/14/18 2114

## 2018-02-14 NOTE — ED Triage Notes (Signed)
Pt here for left eye swelling drainage and itching. sts now the right one is itching.

## 2018-02-14 NOTE — Discharge Instructions (Signed)
Artificial tear gel at night. Lid scrubs and warm compresses as directed. Monitor for any worsening of symptoms, changes in vision, sensitivity to light, eye swelling, painful eye movement, follow up with ophthalmology for further evaluation.

## 2018-04-24 DIAGNOSIS — L709 Acne, unspecified: Secondary | ICD-10-CM | POA: Diagnosis not present

## 2018-04-24 DIAGNOSIS — J302 Other seasonal allergic rhinitis: Secondary | ICD-10-CM | POA: Diagnosis not present

## 2018-04-24 DIAGNOSIS — E785 Hyperlipidemia, unspecified: Secondary | ICD-10-CM | POA: Diagnosis not present

## 2018-04-24 DIAGNOSIS — E559 Vitamin D deficiency, unspecified: Secondary | ICD-10-CM | POA: Diagnosis not present

## 2018-04-24 DIAGNOSIS — G43909 Migraine, unspecified, not intractable, without status migrainosus: Secondary | ICD-10-CM | POA: Diagnosis not present

## 2018-04-24 DIAGNOSIS — R7303 Prediabetes: Secondary | ICD-10-CM | POA: Diagnosis not present

## 2018-05-14 DIAGNOSIS — R7303 Prediabetes: Secondary | ICD-10-CM | POA: Diagnosis not present

## 2018-05-14 DIAGNOSIS — E785 Hyperlipidemia, unspecified: Secondary | ICD-10-CM | POA: Diagnosis not present

## 2018-05-14 DIAGNOSIS — L709 Acne, unspecified: Secondary | ICD-10-CM | POA: Diagnosis not present

## 2018-05-14 DIAGNOSIS — G43909 Migraine, unspecified, not intractable, without status migrainosus: Secondary | ICD-10-CM | POA: Diagnosis not present

## 2018-05-14 DIAGNOSIS — B009 Herpesviral infection, unspecified: Secondary | ICD-10-CM | POA: Diagnosis not present

## 2018-05-14 DIAGNOSIS — R630 Anorexia: Secondary | ICD-10-CM | POA: Diagnosis not present

## 2018-05-14 DIAGNOSIS — J302 Other seasonal allergic rhinitis: Secondary | ICD-10-CM | POA: Diagnosis not present

## 2018-05-14 DIAGNOSIS — E559 Vitamin D deficiency, unspecified: Secondary | ICD-10-CM | POA: Diagnosis not present

## 2018-06-14 DIAGNOSIS — R630 Anorexia: Secondary | ICD-10-CM | POA: Diagnosis not present

## 2018-06-14 DIAGNOSIS — B009 Herpesviral infection, unspecified: Secondary | ICD-10-CM | POA: Diagnosis not present

## 2018-06-14 DIAGNOSIS — N926 Irregular menstruation, unspecified: Secondary | ICD-10-CM | POA: Diagnosis not present

## 2018-06-14 DIAGNOSIS — G43909 Migraine, unspecified, not intractable, without status migrainosus: Secondary | ICD-10-CM | POA: Diagnosis not present

## 2018-06-14 DIAGNOSIS — R7303 Prediabetes: Secondary | ICD-10-CM | POA: Diagnosis not present

## 2018-06-14 DIAGNOSIS — E559 Vitamin D deficiency, unspecified: Secondary | ICD-10-CM | POA: Diagnosis not present

## 2018-06-14 DIAGNOSIS — J302 Other seasonal allergic rhinitis: Secondary | ICD-10-CM | POA: Diagnosis not present

## 2018-06-14 DIAGNOSIS — E785 Hyperlipidemia, unspecified: Secondary | ICD-10-CM | POA: Diagnosis not present

## 2018-06-14 DIAGNOSIS — L709 Acne, unspecified: Secondary | ICD-10-CM | POA: Diagnosis not present

## 2018-06-14 DIAGNOSIS — R1013 Epigastric pain: Secondary | ICD-10-CM | POA: Diagnosis not present

## 2018-06-21 DIAGNOSIS — R7303 Prediabetes: Secondary | ICD-10-CM | POA: Diagnosis not present

## 2018-06-21 DIAGNOSIS — R1013 Epigastric pain: Secondary | ICD-10-CM | POA: Diagnosis not present

## 2018-06-21 DIAGNOSIS — B009 Herpesviral infection, unspecified: Secondary | ICD-10-CM | POA: Diagnosis not present

## 2018-06-21 DIAGNOSIS — N926 Irregular menstruation, unspecified: Secondary | ICD-10-CM | POA: Diagnosis not present

## 2018-06-21 DIAGNOSIS — G43909 Migraine, unspecified, not intractable, without status migrainosus: Secondary | ICD-10-CM | POA: Diagnosis not present

## 2018-06-21 DIAGNOSIS — E785 Hyperlipidemia, unspecified: Secondary | ICD-10-CM | POA: Diagnosis not present

## 2018-06-21 DIAGNOSIS — L709 Acne, unspecified: Secondary | ICD-10-CM | POA: Diagnosis not present

## 2018-06-21 DIAGNOSIS — H1012 Acute atopic conjunctivitis, left eye: Secondary | ICD-10-CM | POA: Diagnosis not present

## 2018-06-21 DIAGNOSIS — E559 Vitamin D deficiency, unspecified: Secondary | ICD-10-CM | POA: Diagnosis not present

## 2018-06-21 DIAGNOSIS — J302 Other seasonal allergic rhinitis: Secondary | ICD-10-CM | POA: Diagnosis not present

## 2018-06-21 DIAGNOSIS — R630 Anorexia: Secondary | ICD-10-CM | POA: Diagnosis not present

## 2018-08-24 ENCOUNTER — Ambulatory Visit (HOSPITAL_COMMUNITY)
Admission: EM | Admit: 2018-08-24 | Discharge: 2018-08-24 | Disposition: A | Discharge status: Home / Self Care | Payer: Medicaid Other

## 2018-08-24 ENCOUNTER — Encounter (HOSPITAL_COMMUNITY): Payer: Self-pay | Admitting: Emergency Medicine

## 2018-08-24 DIAGNOSIS — G43009 Migraine without aura, not intractable, without status migrainosus: Secondary | ICD-10-CM

## 2018-08-24 MED ORDER — DEXAMETHASONE 1 MG/ML PO CONC
10.0000 mg | Freq: Once | ORAL | Status: AC
  Administered 2018-08-24: 10 mg via ORAL

## 2018-08-24 MED ORDER — KETOROLAC TROMETHAMINE 30 MG/ML IJ SOLN
INTRAMUSCULAR | Status: AC
  Filled 2018-08-24: qty 1

## 2018-08-24 MED ORDER — DEXAMETHASONE SODIUM PHOSPHATE 10 MG/ML IJ SOLN: 10.0000 mg | Freq: Once | INTRAMUSCULAR | Status: DC

## 2018-08-24 MED ORDER — DIPHENHYDRAMINE HCL 25 MG PO CAPS
25.0000 mg | ORAL_CAPSULE | Freq: Once | ORAL | Status: AC
  Administered 2018-08-24: 25 mg via ORAL

## 2018-08-24 MED ORDER — DIPHENHYDRAMINE HCL 50 MG/ML IJ SOLN
INTRAMUSCULAR | Status: AC
  Filled 2018-08-24: qty 1

## 2018-08-24 MED ORDER — KETOROLAC TROMETHAMINE 30 MG/ML IJ SOLN
30.0000 mg | Freq: Once | INTRAMUSCULAR | Status: AC
  Administered 2018-08-24: 30 mg via INTRAMUSCULAR

## 2018-08-24 MED ORDER — DIPHENHYDRAMINE HCL 50 MG/ML IJ SOLN: 25.0000 mg | Freq: Once | INTRAMUSCULAR | Status: DC

## 2018-08-24 MED ORDER — DIPHENHYDRAMINE HCL 25 MG PO CAPS
ORAL_CAPSULE | ORAL | Status: AC
  Filled 2018-08-24: qty 1

## 2018-08-24 MED ORDER — DEXAMETHASONE SODIUM PHOSPHATE 10 MG/ML IJ SOLN
INTRAMUSCULAR | Status: AC
  Filled 2018-08-24: qty 1

## 2018-08-24 NOTE — ED Triage Notes (Signed)
Pt c/o headache x2 days states her migraine meds sometimes dont work.

## 2018-08-24 NOTE — Discharge Instructions (Addendum)
Ask Obgyn about changing medications for possible migraine control

## 2018-08-24 NOTE — ED Provider Notes (Signed)
[facility]    CSN: [id number] Arrival date & time: 08/24/18  1001     History   Chief Complaint Chief Complaint  Patient presents with  . Headache    HPI NARIAH MORGANO is a 37 y.o. female.   37 y.o. female presents with migraine symtoms  X 2 days. Condition is acute on chronic in nature. Condition is made better by nothing. Condition is made worse by light. Patient denies any relief from rizatriptan and OTC migraine medication taken  prior to there arrival at this facility. Patient denies any neurological deficits, nausea, vomtting states that this occurs 2 days after the start of her period. Patient is requesting a migraine cocktain.       Past Medical History:  Diagnosis Date  . Blurred vision   . Migraine   . UTI (urinary tract infection)     Patient Active Problem List   Diagnosis Date Noted  . Migraine 09/08/2014  . Headache 09/08/2014  . Neck pain 09/08/2014    Past Surgical History:  Procedure Laterality Date  . CHOLECYSTECTOMY  2007  . TUBAL LIGATION  2006    OB History   None      Home Medications    Prior to Admission medications   Medication Sig Start Date End Date Taking? Authorizing Provider  Multiple Vitamin (MULTIVITAMIN WITH MINERALS) TABS tablet Take 1 tablet by mouth at bedtime.     [provider]  Polyethyl Glycol-Propyl Glycol (SYSTANE) 0.4-0.3 % GEL ophthalmic gel Place 1 application into both eyes at bedtime as needed. 02/14/18   Cathie Hoops, Amy V, PA-C  rizatriptan (MAXALT-MLT) 5 MG disintegrating tablet DIS 1 T ON THE TONGUE QD 08/01/18   [provider]    Family History Family History  Problem Relation Age of Onset  . Heart attack Father   . Diabetes Maternal Grandmother        some uncles as well   . High blood pressure Maternal Grandmother        some uncles as well  . Migraines Neg Hx     Social History Social History   Tobacco Use  . Smoking status: Never Smoker  . Smokeless tobacco:  Never Used  Substance Use Topics  . Alcohol use: No    Alcohol/week: 0.0 standard drinks  . Drug use: No     Allergies   Nyquil multi-symptom [pseudoeph-doxylamine-dm-apap] and Shellfish allergy   Review of Systems Review of Systems  Constitutional: Negative for chills and fever.  HENT: Negative for ear pain and sore throat.   Eyes: Negative for pain and visual disturbance.  Respiratory: Negative for cough and shortness of breath.   Cardiovascular: Negative for chest pain and palpitations.  Gastrointestinal: Negative for abdominal pain and vomiting.  Genitourinary: Negative for dysuria and hematuria.  Musculoskeletal: Negative for arthralgias and back pain.  Skin: Negative for color change and rash.  Neurological: Positive for headaches ( right sided). Negative for seizures and syncope.  All other systems reviewed and are negative.    Physical Exam Triage Vital Signs ED Triage Vitals  Enc Vitals Group     BP 08/24/18 1010 (!) 141/81     Pulse Rate 08/24/18 1010 76     Resp 08/24/18 1010 18     Temp 08/24/18 1010 98.4 F (36.9 C)     Temp Source 08/24/18 1010 Oral     SpO2 08/24/18 1010 98 %     Weight --      Height --  Head Circumference --      Peak Flow --      Pain Score 08/24/18 1016 8     Pain Loc --      Pain Edu? --      Excl. in GC? --    No data found.  Updated Vital Signs BP (!) 141/81 (BP Location: Right Arm)   Pulse 76   Temp 98.4 F (36.9 C) (Oral)   Resp 18   LMP 08/24/2018   SpO2 98%   Visual Acuity Right Eye Distance:   Left Eye Distance:   Bilateral Distance:    Right Eye Near:   Left Eye Near:    Bilateral Near:     Physical Exam  Constitutional: She is oriented to person, place, and time. She appears well-developed and well-nourished.  HENT:  Head: Normocephalic and atraumatic.  Eyes: Conjunctivae are normal.  Neck: Normal range of motion.  Pulmonary/Chest: Effort normal.  Musculoskeletal: Normal range of motion.    Neurological: She is alert and oriented to person, place, and time.  Skin: Skin is warm.  Psychiatric: She has a normal mood and affect.  Nursing note and vitals reviewed.    UC Treatments / Results  Labs (all labs ordered are listed, but only abnormal results are displayed) Labs Reviewed - No data to display  EKG None  Radiology No results found.  Procedures Procedures (including critical care time)  Medications Ordered in UC Medications - No data to display  Initial Impression / Assessment and Plan / UC Course  I have reviewed the triage vital signs and the nursing notes.  Pertinent labs & imaging results that were available during my care of the patient were reviewed by me and considered in my medical decision making (see chart for details).      Final Clinical Impressions(s) / UC Diagnoses   Final diagnoses:  None   Discharge Instructions   None    ED Prescriptions    None     Controlled Substance Prescriptions [place] Controlled Substance Registry consulted? Not Applicable   Alene MiresOmohundro, Jennifer C, NP 08/24/18 1040

## 2018-09-16 ENCOUNTER — Encounter (HOSPITAL_COMMUNITY): Payer: Self-pay | Admitting: Emergency Medicine

## 2018-09-16 ENCOUNTER — Encounter: Payer: Self-pay | Admitting: Gastroenterology

## 2018-09-16 ENCOUNTER — Emergency Department (HOSPITAL_COMMUNITY)
Admission: EM | Admit: 2018-09-16 | Discharge: 2018-09-16 | Disposition: A | Discharge status: Home / Self Care | Payer: Medicaid Other | Attending: Emergency Medicine | Admitting: Emergency Medicine

## 2018-09-16 ENCOUNTER — Other Ambulatory Visit: Payer: Self-pay

## 2018-09-16 DIAGNOSIS — Z79899 Other long term (current) drug therapy: Secondary | ICD-10-CM | POA: Insufficient documentation

## 2018-09-16 DIAGNOSIS — R1012 Left upper quadrant pain: Secondary | ICD-10-CM | POA: Insufficient documentation

## 2018-09-16 DIAGNOSIS — R1013 Epigastric pain: Secondary | ICD-10-CM | POA: Diagnosis not present

## 2018-09-16 LAB — COMPREHENSIVE METABOLIC PANEL
ALT: 14 U/L (ref 0–44)
ANION GAP: 9 (ref 5–15)
AST: 20 U/L (ref 15–41)
Albumin: 3.7 g/dL (ref 3.5–5.0)
Alkaline Phosphatase: 37 U/L — ABNORMAL LOW (ref 38–126)
BUN: 8 mg/dL (ref 6–20)
CHLORIDE: 107 mmol/L (ref 98–111)
CO2: 22 mmol/L (ref 22–32)
Calcium: 9.1 mg/dL (ref 8.9–10.3)
Creatinine, Ser: 0.9 mg/dL (ref 0.44–1.00)
GFR calc Af Amer: >60 mL/min (ref >60)
Glucose, Bld: 101 mg/dL — ABNORMAL HIGH (ref 70–99)
POTASSIUM: 4.1 mmol/L (ref 3.5–5.1)
SODIUM: 138 mmol/L (ref 135–145)
Total Bilirubin: 0.2 mg/dL — ABNORMAL LOW (ref 0.3–1.2)
Total Protein: 6.7 g/dL (ref 6.5–8.1)

## 2018-09-16 LAB — CBC
HEMATOCRIT: 35.3 % — AB (ref 36.0–46.0)
Hemoglobin: 11 g/dL — ABNORMAL LOW (ref 12.0–15.0)
MCH: 26.3 pg (ref 26.0–34.0)
MCHC: 31.2 g/dL (ref 30.0–36.0)
MCV: 84.2 fL (ref 80.0–100.0)
Platelets: 155 10*3/uL (ref 150–400)
RBC: 4.19 MIL/uL (ref 3.87–5.11)
RDW: 15.9 % — AB (ref 11.5–15.5)
WBC: 5.8 10*3/uL (ref 4.0–10.5)
nRBC: 0 % (ref 0.0–0.2)

## 2018-09-16 LAB — URINALYSIS, ROUTINE W REFLEX MICROSCOPIC
Bilirubin Urine: NEGATIVE
GLUCOSE, UA: NEGATIVE mg/dL
Hgb urine dipstick: NEGATIVE
Ketones, ur: NEGATIVE mg/dL
LEUKOCYTES UA: NEGATIVE
NITRITE: NEGATIVE
PH: 6 (ref 5.0–8.0)
PROTEIN: NEGATIVE mg/dL
Specific Gravity, Urine: 1.011 (ref 1.005–1.030)

## 2018-09-16 LAB — I-STAT BETA HCG BLOOD, ED (MC, WL, AP ONLY): I-stat hCG, quantitative: <5 m[IU]/mL (ref <5)

## 2018-09-16 LAB — LIPASE, BLOOD: LIPASE: 50 U/L (ref 11–51)

## 2018-09-16 MED ORDER — OMEPRAZOLE 20 MG PO CPDR: 20.0000 mg | DELAYED_RELEASE_CAPSULE | Freq: Every day | ORAL | 0 refills | Status: DC

## 2018-09-16 MED ORDER — ALUM & MAG HYDROXIDE-SIMETH 200-200-20 MG/5ML PO SUSP
30.0000 mL | Freq: Once | ORAL | Status: AC
  Administered 2018-09-16: 30 mL via ORAL
  Filled 2018-09-16: qty 30

## 2018-09-16 MED ORDER — LIDOCAINE VISCOUS HCL 2 % MT SOLN
15.0000 mL | Freq: Once | OROMUCOSAL | Status: AC
  Administered 2018-09-16: 15 mL via ORAL
  Filled 2018-09-16: qty 15

## 2018-09-16 NOTE — ED Provider Notes (Signed)
[facility] EMERGENCY DEPARTMENT Provider Note   CSN: [id number] Arrival date & time: 09/16/18  1914     History   Chief Complaint Chief Complaint  Patient presents with  . Abdominal Pain    HPI Brittney Watkins is a 37 y.o. female with a history of prior cholecystectomy presents emergency department today for left upper quadrant epigastric abdominal pain over the last 2 to 3 weeks.  Patient reports a constant, waxing and waning abdominal pain that she describes as sharp and burning in nature in her left upper quadrant and epigastric area.  She reports this pain is typically worse when she goes for a period of time without eating.  She notes this feels like a gnawing sensation.  She reports after eating she will have an increase sensation of bloating but denies any increase in pain.  She reports at night her pain is worsened and she occasionally will have some burping and belching.  The patient reports she has been treating her symptoms with ibuprofen without any relief.  She reports she went to her primary care doctor and had a breathing test done for ulcers that was negative.  Patient denies any alcohol use.  No drug use.  She denies any lower abdominal pain, flank pain, fevers, nausea, vomiting, diarrhea, or urinary symptoms.  Patient reports last bowel movement was yesterday and normal.  No melena or hematochezia.  Husband is at bedside and supportive.  She rates her current pain level is a 2-3/10.  She notes it was more painful when she initially presented earlier this morning.  HPI  Past Medical History:  Diagnosis Date  . Blurred vision   . Migraine   . UTI (urinary tract infection)     Patient Active Problem List   Diagnosis Date Noted  . Migraine 09/08/2014  . Headache 09/08/2014  . Neck pain 09/08/2014    Past Surgical History:  Procedure Laterality Date  . CHOLECYSTECTOMY  2007  . TUBAL LIGATION  2006     OB History   No obstetric history on  file.      Home Medications    Prior to Admission medications   Medication Sig Start Date End Date Taking? Authorizing Provider  Multiple Vitamin (MULTIVITAMIN WITH MINERALS) TABS tablet Take 1 tablet by mouth at bedtime.     [provider]  Polyethyl Glycol-Propyl Glycol (SYSTANE) 0.4-0.3 % GEL ophthalmic gel Place 1 application into both eyes at bedtime as needed. 02/14/18   Cathie Hoops, Amy V, PA-C  rizatriptan (MAXALT-MLT) 5 MG disintegrating tablet DIS 1 T ON THE TONGUE QD 08/01/18   [provider]    Family History Family History  Problem Relation Age of Onset  . Heart attack Father   . Diabetes Maternal Grandmother        some uncles as well   . High blood pressure Maternal Grandmother        some uncles as well  . Migraines Neg Hx     Social History Social History   Tobacco Use  . Smoking status: Never Smoker  . Smokeless tobacco: Never Used  Substance Use Topics  . Alcohol use: No    Alcohol/week: 0.0 standard drinks  . Drug use: No     Allergies   Nyquil multi-symptom [pseudoeph-doxylamine-dm-apap] and Shellfish allergy   Review of Systems Review of Systems  All other systems reviewed and are negative.    Physical Exam Updated Vital Signs BP 115/81 (BP Location: Left Arm)  Pulse 76   Temp 97.9 F (36.6 C) (Oral)   Resp 18   LMP 08/24/2018   SpO2 100%   Physical Exam Vitals signs and nursing note reviewed.  Constitutional:      Appearance: She is well-developed. She is not diaphoretic.  HENT:     Head: Normocephalic and atraumatic.     Right Ear: External ear normal.     Left Ear: External ear normal.     Nose: Nose normal.     Mouth/Throat:     Pharynx: Uvula midline.     Tonsils: No tonsillar exudate.  Eyes:     General: No scleral icterus.       Right eye: No discharge.        Left eye: No discharge.     Pupils: Pupils are equal, round, and reactive to light.  Neck:     Musculoskeletal: Neck supple. Normal range of  motion. No neck rigidity or spinous process tenderness.     Trachea: Trachea normal.  Cardiovascular:     Rate and Rhythm: Normal rate and regular rhythm.     Pulses:          Radial pulses are 2+ on the right side and 2+ on the left side.       Dorsalis pedis pulses are 2+ on the right side and 2+ on the left side.       Posterior tibial pulses are 2+ on the right side and 2+ on the left side.     Heart sounds: No murmur.  Pulmonary:     Effort: Pulmonary effort is normal.     Breath sounds: Normal breath sounds.  Chest:     Chest wall: No tenderness.  Abdominal:     General: Bowel sounds are normal.     Palpations: Abdomen is soft.     Tenderness: There is abdominal tenderness in the left upper quadrant. There is no right CVA tenderness, left CVA tenderness, guarding or rebound. Negative signs include Murphy's sign and McBurney's sign.  Lymphadenopathy:     Cervical: No cervical adenopathy.  Skin:    General: Skin is warm and dry.     Findings: No rash.  Neurological:     Mental Status: She is alert.      ED Treatments / Results  Labs (all labs ordered are listed, but only abnormal results are displayed) Labs Reviewed  COMPREHENSIVE METABOLIC PANEL - Abnormal; Notable for the following components:      Result Value   Glucose, Bld 101 (*)    Alkaline Phosphatase 37 (*)    Total Bilirubin 0.2 (*)    All other components within normal limits  CBC - Abnormal; Notable for the following components:   Hemoglobin 11.0 (*)    HCT 35.3 (*)    RDW 15.9 (*)    All other components within normal limits  URINALYSIS, ROUTINE W REFLEX MICROSCOPIC - Abnormal; Notable for the following components:   Color, Urine STRAW (*)    All other components within normal limits  LIPASE, BLOOD  I-STAT BETA HCG BLOOD, ED ([facility], [facility], [facility] ONLY)    EKG None  Radiology No results found.  Procedures Procedures (including critical care time)  Medications Ordered in ED Medications  alum & mag  hydroxide-simeth (MAALOX/MYLANTA) 200-200-20 MG/5ML suspension 30 mL (30 mLs Oral Given 09/16/18 0804)    And  lidocaine (XYLOCAINE) 2 % viscous mouth solution 15 mL (15 mLs Oral Given 09/16/18 0804)  Initial Impression / Assessment and Plan / ED Course  I have reviewed the triage vital signs and the nursing notes.  Pertinent labs & imaging results that were available during my care of the patient were reviewed by me and considered in my medical decision making (see chart for details).     37 y.o. female presenting with epigastric and left upper quadrant abdominal pain that is been constant in the last several weeks, increased after periods of not eating, worse at night, associated with burping and belching, and reported bloating after eating.  Vital signs reassuring on presentation.  Patient does not meet Sirs or sepsis criteria.  Abdominal exam is with very mild tenderness of the left upper quadrant.  No peritoneal signs.  No lower abdominal pain.  No CVA tenderness.  No urinary symptoms.  Negative Murphy sign and patient has had prior cholecystectomy.  Patient reports normal bowel movement yesterday.  She denies any nausea or vomiting.  She is still passing gas.  No negation for imaging at this time.  Will give GI cocktail.  Patient reports pain improved after GI cocktail.  I suspect likely gastritis.  Patient has had prior cholecystectomy.  Lab work is unremarkable.  No concern for pancreatitis, bowel obstruction, bowel perforation, pyelonephritis, UTI, ectopic, diverticulitis, or appendicitis.  Will have patient stop taking ibuprofen.  Recommend avoiding all NSAIDs and alcohol.  Will start on omeprazole.  Patient will follow-up with primary care provider by the end of the week.  She was given a referral to GI on a as needed basis.  Patient and husband state understanding and agreement with plan.  Return precautions were discussed.  Patient appears safe for discharge.   Final Clinical  Impressions(s) / ED Diagnoses   Final diagnoses:  Left upper quadrant pain    ED Discharge Orders         Ordered    omeprazole (PRILOSEC) 20 MG capsule  Daily     09/16/18 0805           Jacinto HalimMaczis, Adeeb Konecny M, PA-C 09/16/18 [id number]    Sabas SousBero, Zema Lizardo M, MD 09/19/18 [phone]

## 2018-09-16 NOTE — Discharge Instructions (Addendum)
Please read and follow all provided instructions You have been seen today for your complaint of: abdominal pain Your lab work: was reassuring As we discussed I do not feel any imaging is indicated at this time.  You are treated with a GI cocktail in the department.  I am starting you on a proton pump inhibitor medication to help decrease stomach acid.  Please stop taking ibuprofen and avoid all other NSAIDs (naproxen, Aleve, meloxicam etc.).  Please also avoid alcohol.  I would like you to follow-up with your primary care provider by the end of the week.  I have given you a GI referral on an as-needed basis.  Please see attached handouts and return instructions as below. Vital signs: See below  Abdominal Pain  Your exam might not show the exact reason you have abdominal pain. Since there are many different causes of abdominal pain, another checkup and more tests may be needed. It is very important to follow up for lasting (persistent) or worsening symptoms. A possible cause of abdominal pain in any person who still has his or her appendix is acute appendicitis. Appendicitis is often hard to diagnose. Normal blood tests, urine tests, ultrasound, and CT scans do not completely rule out early appendicitis or other causes of abdominal pain. Sometimes, only the changes that happen over time will allow appendicitis and other causes of abdominal pain to be determined. Other potential problems that may require surgery may also take time to become more apparent. Because of this, it is important that you follow all of the instructions below.   HOME CARE INSTRUCTIONS  Do not take laxatives unless directed by your caregiver. Rest as much as possible.  Do not eat solid food until your pain is gone: A diet of water, weak decaffeinated tea, broth or bouillon, gelatin, oral rehydration solutions (ORS), frozen ice pops, or ice chips may be helpful.  When pain is gone: Start a light diet (dry toast, crackers, applesauce,  or white rice). Increase the diet slowly as long as it does not bother you. Eat no dairy products (including cheese and eggs) and no spicy, fatty, fried, or high-fiber foods.  Use no alcohol, caffeine, or cigarettes.  Take your regular medicines unless your caregiver told you not to.  Take any prescribed medicine as directed.   SEEK IMMEDIATE MEDICAL CARE IF:  The pain does not go away.  You have a fever >101 that does not go down with medication. You keep throwing up (vomiting) or cannot drink liquids.  You have shaking chills (rigors) The pain becomes localized (Pain in the right side could possibly be appendicitis. In an adult, pain in the left lower portion of the abdomen could be colitis or diverticulitis). You pass bloody or black tarry stools.  There is bright red blood in the stool.  There is blood in your vomit. Your bowel movements stop (become blocked) or you cannot pass gas.  The constipation stays for more than 4 days.  You have bloody, frequent, or painful urination.  You have yellow discoloration in the skin or whites of the eyes.  Your stomach becomes bloated or bigger.  You have dizziness or fainting.  You have chest or back pain. You have rectal pain.  You do not seem to be getting better.  You have any questions or concerns.   Your vital signs today were: BP 115/81 (BP Location: Left Arm)    Pulse 76    Temp 97.9 F (36.6 C) (Oral)  Resp 18    LMP 08/24/2018    SpO2 100%  If your blood pressure (bp) was elevated above 135/85 this visit, please have this repeated by your doctor within one month.

## 2018-09-16 NOTE — ED Triage Notes (Signed)
C/o epigastric pain x 3 weeks.  Denies nausea, vomiting, and diarrhea.

## 2018-09-17 ENCOUNTER — Encounter (HOSPITAL_COMMUNITY): Payer: Self-pay

## 2018-09-17 ENCOUNTER — Ambulatory Visit (HOSPITAL_COMMUNITY)
Admission: EM | Admit: 2018-09-17 | Discharge: 2018-09-17 | Disposition: A | Discharge status: Home / Self Care | Payer: Medicaid Other | Attending: Family Medicine | Admitting: Family Medicine

## 2018-09-17 DIAGNOSIS — G43119 Migraine with aura, intractable, without status migrainosus: Secondary | ICD-10-CM

## 2018-09-17 MED ORDER — SUMATRIPTAN SUCCINATE 6 MG/0.5ML ~~LOC~~ SOLN
SUBCUTANEOUS | Status: AC
  Filled 2018-09-17: qty 0.5

## 2018-09-17 MED ORDER — DEXAMETHASONE SODIUM PHOSPHATE 10 MG/ML IJ SOLN
INTRAMUSCULAR | Status: AC
  Filled 2018-09-17: qty 1

## 2018-09-17 MED ORDER — RIZATRIPTAN BENZOATE 5 MG PO TBDP: ORAL_TABLET | ORAL | 0 refills | Status: DC

## 2018-09-17 MED ORDER — SUMATRIPTAN SUCCINATE 6 MG/0.5ML ~~LOC~~ SOLN
6.0000 mg | Freq: Once | SUBCUTANEOUS | Status: AC
  Administered 2018-09-17: 6 mg via SUBCUTANEOUS

## 2018-09-17 MED ORDER — KETOROLAC TROMETHAMINE 60 MG/2ML IM SOLN
INTRAMUSCULAR | Status: AC
  Filled 2018-09-17: qty 2

## 2018-09-17 MED ORDER — DEXAMETHASONE SODIUM PHOSPHATE 10 MG/ML IJ SOLN
10.0000 mg | Freq: Once | INTRAMUSCULAR | Status: AC
  Administered 2018-09-17: 10 mg via INTRAMUSCULAR

## 2018-09-17 MED ORDER — KETOROLAC TROMETHAMINE 60 MG/2ML IM SOLN
60.0000 mg | Freq: Once | INTRAMUSCULAR | Status: AC
  Administered 2018-09-17: 60 mg via INTRAMUSCULAR

## 2018-09-17 NOTE — ED Triage Notes (Signed)
Pt presents migraine, symptoms started a few days ago.  Pt states she is out of medication and her pcp is closed till Thursday.

## 2018-09-17 NOTE — Discharge Instructions (Addendum)
  Please seek prompt medical care if: You have: A very bad (severe) headache that is not helped by medicine. Trouble walking or weakness in your arms and legs. Clear or bloody fluid coming from your nose or ears. Changes in your seeing (vision). Jerky movements that you cannot control (seizure). You throw up (vomit). Your symptoms get worse. You lose balance. Your speech is slurred. You pass out. You are sleepier and have trouble staying awake. The black centers of your eyes (pupils) change in size.  These symptoms may be an emergency. Do not wait to see if the symptoms will go away. Get medical help right away. Call your local emergency services. Do not drive yourself to the hospital.  

## 2018-09-18 NOTE — ED Provider Notes (Signed)
[facility]   [id number] 09/17/18 Arrival Time: 1708  ASSESSMENT & PLAN:  1. Intractable migraine with aura without status migrainosus    Normal neurological exam. Discussed. No indication for neurodiagnostic workup at this time. Refilled Maxalt.  Meds ordered this encounter  Medications  . rizatriptan (MAXALT-MLT) 5 MG disintegrating tablet    Sig: DIS 1 T ON THE TONGUE QD    Dispense:  10 tablet    Refill:  0  . ketorolac (TORADOL) injection 60 mg  . dexamethasone (DECADRON) injection 10 mg  . SUMAtriptan (IMITREX) injection 6 mg   Follow-up Information    [facility] EMERGENCY DEPARTMENT.   Specialty:  Emergency Medicine Why:  If symptoms worsen. Contact information: [address] [id number] mc [place] [address] [phone]        Rec. prompt f/u with PCP.  Reviewed expectations re: course of current medical issues. Questions answered. Outlined signs and symptoms indicating need for more acute intervention. Patient verbalized understanding. After Visit Summary given.   SUBJECTIVE:  Brittney Watkins is a 38 y.o. female who presents with complaint of a migraine headache. Reports gradual onset a few days ago. Location: temporal, frontal without radiation. History of headaches: yes; similar symptoms. Precipitating factors include: none which have been determined. Associated symptoms: Preceding aura: yes, vision changes. Nausea/vomiting: yes. Vision changes: with aura initially; none currently. Increased sensitivity to light and to noises: yes. Fever: no. Sinus pressure/congestion: no. Extremity weakness: no. Home treatment has included darkening the room and resting with no improvement. Current headache has limited normal daily activities. Denies depression, dizziness, loss of balance, muscle weakness, numbness of extremities and speech difficulties. Out of Maxalt. Requests refill.  ROS: As per HPI. All  other systems negative.    OBJECTIVE:  Vitals:   09/17/18 1823  BP: 121/81  Pulse: 69  Resp: 16  Temp: 98.5 F (36.9 C)  TempSrc: Oral  SpO2: 98%    General appearance: alert; no distress but appears fatigued Eyes: PERRLA; EOMI; conjunctiva normal HENT: normocephalic; atraumatic Neck: supple with FROM Lungs: clear to auscultation bilaterally Heart: regular rate and rhythm Extremities: no edema; symmetrical with no gross deformities Skin: warm and dry Neurologic: CN 2-12 grossly intact; normal gait; normal symmetric reflexes; normal extremity strength and sensation throughout Psychological: alert and cooperative; normal mood and affect  Labs Reviewed: Results for orders placed or performed during the hospital encounter of 09/16/18  Lipase, blood  Result Value Ref Range   Lipase 50 11 - 51 U/L  Comprehensive metabolic panel  Result Value Ref Range   Sodium 138 135 - 145 mmol/L   Potassium 4.1 3.5 - 5.1 mmol/L   Chloride 107 98 - 111 mmol/L   CO2 22 22 - 32 mmol/L   Glucose, Bld 101 (H) 70 - 99 mg/dL   BUN 8 6 - 20 mg/dL   Creatinine, Ser 5.63 0.44 - 1.00 mg/dL   Calcium 9.1 8.9 - 87.5 mg/dL   Total Protein 6.7 6.5 - 8.1 g/dL   Albumin 3.7 3.5 - 5.0 g/dL   AST 20 15 - 41 U/L   ALT 14 0 - 44 U/L   Alkaline Phosphatase 37 (L) 38 - 126 U/L   Total Bilirubin 0.2 (L) 0.3 - 1.2 mg/dL   GFR calc non Af Amer >60 >60 mL/min   GFR calc Af Amer >60 >60 mL/min   Anion gap 9 5 - 15  CBC  Result Value Ref Range   WBC 5.8  4.0 - 10.5 K/uL   RBC 4.19 3.87 - 5.11 MIL/uL   Hemoglobin 11.0 (L) 12.0 - 15.0 g/dL   HCT 16.135.3 (L) 09.636.0 - 04.546.0 %   MCV 84.2 80.0 - 100.0 fL   MCH 26.3 26.0 - 34.0 pg   MCHC 31.2 30.0 - 36.0 g/dL   RDW 40.915.9 (H) 81.111.5 - 91.415.5 %   Platelets 155 150 - 400 K/uL   nRBC 0.0 0.0 - 0.2 %  Urinalysis, Routine w reflex microscopic  Result Value Ref Range   Color, Urine STRAW (A) YELLOW   APPearance CLEAR CLEAR   Specific Gravity, Urine 1.011 1.005 - 1.030   pH  6.0 5.0 - 8.0   Glucose, UA NEGATIVE NEGATIVE mg/dL   Hgb urine dipstick NEGATIVE NEGATIVE   Bilirubin Urine NEGATIVE NEGATIVE   Ketones, ur NEGATIVE NEGATIVE mg/dL   Protein, ur NEGATIVE NEGATIVE mg/dL   Nitrite NEGATIVE NEGATIVE   Leukocytes, UA NEGATIVE NEGATIVE  I-Stat beta hCG blood, ED  Result Value Ref Range   I-stat hCG, quantitative <5.0 <5 mIU/mL   Comment 3            Allergies  Allergen Reactions  . Nyquil Multi-Symptom [Pseudoeph-Doxylamine-Dm-Apap] Swelling and Rash  . Shellfish Allergy Rash    Past Medical History:  Diagnosis Date  . Blurred vision   . Migraine   . UTI (urinary tract infection)    Social History   Socioeconomic History  . Marital status: Single    Spouse name: Not on file  . Number of children: 2  . Years of education: 12+  . Highest education level: Not on file  Occupational History  . Not on file  Social Needs  . Financial resource strain: Not on file  . Food insecurity:    Worry: Not on file    Inability: Not on file  . Transportation needs:    Medical: Not on file    Non-medical: Not on file  Tobacco Use  . Smoking status: Never Smoker  . Smokeless tobacco: Never Used  Substance and Sexual Activity  . Alcohol use: No    Alcohol/week: 0.0 standard drinks  . Drug use: No  . Sexual activity: Yes    Birth control/protection: Surgical  Lifestyle  . Physical activity:    Days per week: Not on file    Minutes per session: Not on file  . Stress: Not on file  Relationships  . Social connections:    Talks on phone: Not on file    Gets together: Not on file    Attends religious service: Not on file    Active member of club or organization: Not on file    Attends meetings of clubs or organizations: Not on file    Relationship status: Not on file  . Intimate partner violence:    Fear of current or ex partner: Not on file    Emotionally abused: Not on file    Physically abused: Not on file    Forced sexual activity: Not on  file  Other Topics Concern  . Not on file  Social History Narrative   Patient lives at home with children and their father  Verlin DikeSherrell Senkbeil    Patient has 2 children    Patient is a Archivistcollege student    [place] [employer] [place]    Patient is right handed    Family History  Problem Relation Age of Onset  . Heart attack Father   . Diabetes Maternal Grandmother  some uncles as well   . High blood pressure Maternal Grandmother        some uncles as well  . Migraines Neg Hx    Past Surgical History:  Procedure Laterality Date  . CHOLECYSTECTOMY  2007  . TUBAL LIGATION  2006     Mardella Layman, MD 09/18/18 [phone]

## 2018-10-15 ENCOUNTER — Ambulatory Visit: Payer: Medicaid Other | Admitting: Gastroenterology

## 2018-10-15 NOTE — Progress Notes (Deleted)
[facility] Consult Note:  History: Brittney Watkins 10/15/2018  Referring physician: Jackie Watkins, George, MD, referred to us after recent emergency department visit  Reason for consult/chief complaint: No chief complaint on file.   Subjective  HPI:  ***  She was in the ED 09/16/2018 with left upper quadrant pain.  That provider note and work-up was reviewed.  She has had a few other visits for abdominal pain in the last several years since her cholecystectomy.  She has had many other ED visits for migraine and anxiety. Brittney Watkins reports that her primary care provider did what sounds like a urea breath test, and she recalls it was negative. ROS:  Review of Systems   Past Medical History: Past Medical History:  Diagnosis Date  . Blurred vision   . Migraine   . UTI (urinary tract infection)      Past Surgical History: Past Surgical History:  Procedure Laterality Date  . CHOLECYSTECTOMY  2007  . TUBAL LIGATION  2006     Family History: Family History  Problem Relation Age of Onset  . Heart attack Father   . Diabetes Maternal Grandmother        some uncles as well   . High blood pressure Maternal Grandmother        some uncles as well  . Migraines Neg Hx     Social History: Social History   Socioeconomic History  . Marital status: Single    Spouse name: Not on file  . Number of children: 2  . Years of education: 12+  . Highest education level: Not on file  Occupational History  . Not on file  Social Needs  . Financial resource strain: Not on file  . Food insecurity:    Worry: Not on file    Inability: Not on file  . Transportation needs:    Medical: Not on file    Non-medical: Not on file  Tobacco Use  . Smoking status: Never Smoker  . Smokeless tobacco: Never Used  Substance and Sexual Activity  . Alcohol use: No    Alcohol/week: 0.0 standard drinks  . Drug use: No  . Sexual activity: Yes    Birth control/protection:  Surgical  Lifestyle  . Physical activity:    Days per week: Not on file    Minutes per session: Not on file  . Stress: Not on file  Relationships  . Social connections:    Talks on phone: Not on file    Gets together: Not on file    Attends religious service: Not on file    Active member of club or organization: Not on file    Attends meetings of clubs or organizations: Not on file    Relationship status: Not on file  Other Topics Concern  . Not on file  Social History Narrative   Patient lives at home with children and their father  Brittney Watkins    Patient has 2 children    Patient is a Archivistcollege student    [place] [employer] [place]    Patient is right handed     Allergies: Allergies  Allergen Reactions  . Nyquil Multi-Symptom [Pseudoeph-Doxylamine-Dm-Apap] Swelling and Rash  . Shellfish Allergy Rash    Outpatient Meds: Current Outpatient Medications  Medication Sig Dispense Refill  . Multiple Vitamin (MULTIVITAMIN WITH MINERALS) TABS tablet Take 1 tablet by mouth at bedtime.     Marland Kitchen. omeprazole (PRILOSEC) 20 MG capsule Take 1 capsule (20 mg total) by  mouth daily. 30 capsule 0  . Polyethyl Glycol-Propyl Glycol (SYSTANE) 0.4-0.3 % GEL ophthalmic gel Place 1 application into both eyes at bedtime as needed. 1 Bottle 0  . rizatriptan (MAXALT-MLT) 5 MG disintegrating tablet DIS 1 T ON THE TONGUE QD 10 tablet 0   Current Facility-Administered Medications  Medication Dose Route Frequency Provider Last Rate Last Dose  . methylPREDNISolone sodium succinate (SOLU-MEDROL) 500 mg in sodium chloride 0.9 % 100 mL IVPB  500 mg Intravenous Continuous Brittney Dean B, MD   500 mg at 11/26/14 1653  . valproate (DEPACON) 1,000 mg in sodium chloride 0.9 % 100 mL IVPB  1,000 mg Intravenous Continuous Brittney Dean B, MD 110 mL/hr at 11/26/14 1630 1,000 mg at 11/26/14 1630      ___________________________________________________________________ Objective   Exam:  There were  no vitals taken for this visit.   General: this is a(n) ***   Eyes: sclera anicteric, no redness  ENT: oral mucosa moist without lesions, no cervical or supraclavicular lymphadenopathy  CV: RRR without murmur, S1/S2, no JVD, no peripheral edema  Resp: clear to auscultation bilaterally, normal RR and effort noted  GI: soft, *** tenderness, with active bowel sounds. No guarding or palpable organomegaly noted.  Skin; warm and dry, no rash or jaundice noted  Neuro: awake, alert and oriented x 3. Normal gross motor function and fluent speech  Labs:  CBC Latest Ref Rng & Units 09/16/2018 04/15/2017 06/29/2016  WBC 4.0 - 10.5 K/uL 5.8 5.1 7.9  Hemoglobin 12.0 - 15.0 g/dL 11.0(L) 11.3(L) 10.9(L)  Hematocrit 36.0 - 46.0 % 35.3(L) 34.4(L) 33.8(L)  Platelets 150 - 400 K/uL 155 151 127(L)   CMP Latest Ref Rng & Units 09/16/2018 04/15/2017 06/29/2016  Glucose 70 - 99 mg/dL 599(H) 741(S) 95  BUN 6 - 20 mg/dL 8 6 7   Creatinine 0.44 - 1.00 mg/dL 2.39 5.32 0.23(X)  Sodium 135 - 145 mmol/L 138 138 139  Potassium 3.5 - 5.1 mmol/L 4.1 3.4(L) 3.7  Chloride 98 - 111 mmol/L 107 106 110  CO2 22 - 32 mmol/L 22 23 24   Calcium 8.9 - 10.3 mg/dL 9.1 9.0 9.4  Total Protein 6.5 - 8.1 g/dL 6.7 6.7 6.2(L)  Total Bilirubin 0.3 - 1.2 mg/dL 4.3(H) 0.4 0.5  Alkaline Phos 38 - 126 U/L 37(L) 49 42  AST 15 - 41 U/L 20 28 20   ALT 0 - 44 U/L 14 12(L) 13(L)   Lipase 50   Radiologic Studies:  No abdominal imaging  Assessment: No diagnosis found.  ***  Plan:  ***  Thank you for the courtesy of this consult.  Please call me with any questions or concerns.  Brittney Watkins  CC: Referring provider noted above

## 2018-10-23 DIAGNOSIS — J302 Other seasonal allergic rhinitis: Secondary | ICD-10-CM | POA: Diagnosis not present

## 2018-10-23 DIAGNOSIS — E559 Vitamin D deficiency, unspecified: Secondary | ICD-10-CM | POA: Diagnosis not present

## 2018-10-23 DIAGNOSIS — R630 Anorexia: Secondary | ICD-10-CM | POA: Diagnosis not present

## 2018-10-23 DIAGNOSIS — N926 Irregular menstruation, unspecified: Secondary | ICD-10-CM | POA: Diagnosis not present

## 2018-10-23 DIAGNOSIS — L709 Acne, unspecified: Secondary | ICD-10-CM | POA: Diagnosis not present

## 2018-10-23 DIAGNOSIS — R7303 Prediabetes: Secondary | ICD-10-CM | POA: Diagnosis not present

## 2018-10-23 DIAGNOSIS — G43909 Migraine, unspecified, not intractable, without status migrainosus: Secondary | ICD-10-CM | POA: Diagnosis not present

## 2018-10-23 DIAGNOSIS — E785 Hyperlipidemia, unspecified: Secondary | ICD-10-CM | POA: Diagnosis not present

## 2018-10-23 DIAGNOSIS — R1013 Epigastric pain: Secondary | ICD-10-CM | POA: Diagnosis not present

## 2018-10-23 DIAGNOSIS — B009 Herpesviral infection, unspecified: Secondary | ICD-10-CM | POA: Diagnosis not present

## 2018-11-22 DIAGNOSIS — R1013 Epigastric pain: Secondary | ICD-10-CM | POA: Diagnosis not present

## 2018-11-22 DIAGNOSIS — N941 Unspecified dyspareunia: Secondary | ICD-10-CM | POA: Diagnosis not present

## 2018-11-22 DIAGNOSIS — G43909 Migraine, unspecified, not intractable, without status migrainosus: Secondary | ICD-10-CM | POA: Diagnosis not present

## 2018-11-22 DIAGNOSIS — L709 Acne, unspecified: Secondary | ICD-10-CM | POA: Diagnosis not present

## 2018-11-22 DIAGNOSIS — R7303 Prediabetes: Secondary | ICD-10-CM | POA: Diagnosis not present

## 2018-11-22 DIAGNOSIS — E785 Hyperlipidemia, unspecified: Secondary | ICD-10-CM | POA: Diagnosis not present

## 2018-11-22 DIAGNOSIS — Z131 Encounter for screening for diabetes mellitus: Secondary | ICD-10-CM | POA: Diagnosis not present

## 2018-11-22 DIAGNOSIS — Z0110 Encounter for examination of ears and hearing without abnormal findings: Secondary | ICD-10-CM | POA: Diagnosis not present

## 2018-11-22 DIAGNOSIS — Z113 Encounter for screening for infections with a predominantly sexual mode of transmission: Secondary | ICD-10-CM | POA: Diagnosis not present

## 2018-11-22 DIAGNOSIS — N926 Irregular menstruation, unspecified: Secondary | ICD-10-CM | POA: Diagnosis not present

## 2018-11-22 DIAGNOSIS — E559 Vitamin D deficiency, unspecified: Secondary | ICD-10-CM | POA: Diagnosis not present

## 2018-11-22 DIAGNOSIS — B009 Herpesviral infection, unspecified: Secondary | ICD-10-CM | POA: Diagnosis not present

## 2018-12-10 DIAGNOSIS — E559 Vitamin D deficiency, unspecified: Secondary | ICD-10-CM | POA: Diagnosis not present

## 2018-12-10 DIAGNOSIS — N941 Unspecified dyspareunia: Secondary | ICD-10-CM | POA: Diagnosis not present

## 2018-12-10 DIAGNOSIS — N926 Irregular menstruation, unspecified: Secondary | ICD-10-CM | POA: Diagnosis not present

## 2018-12-10 DIAGNOSIS — R7303 Prediabetes: Secondary | ICD-10-CM | POA: Diagnosis not present

## 2018-12-10 DIAGNOSIS — R1013 Epigastric pain: Secondary | ICD-10-CM | POA: Diagnosis not present

## 2018-12-10 DIAGNOSIS — B009 Herpesviral infection, unspecified: Secondary | ICD-10-CM | POA: Diagnosis not present

## 2018-12-10 DIAGNOSIS — G43909 Migraine, unspecified, not intractable, without status migrainosus: Secondary | ICD-10-CM | POA: Diagnosis not present

## 2018-12-10 DIAGNOSIS — E785 Hyperlipidemia, unspecified: Secondary | ICD-10-CM | POA: Diagnosis not present

## 2018-12-10 DIAGNOSIS — L709 Acne, unspecified: Secondary | ICD-10-CM | POA: Diagnosis not present

## 2018-12-18 DIAGNOSIS — N926 Irregular menstruation, unspecified: Secondary | ICD-10-CM | POA: Diagnosis not present

## 2018-12-18 DIAGNOSIS — N941 Unspecified dyspareunia: Secondary | ICD-10-CM | POA: Diagnosis not present

## 2018-12-18 DIAGNOSIS — R1013 Epigastric pain: Secondary | ICD-10-CM | POA: Diagnosis not present

## 2018-12-18 DIAGNOSIS — Z124 Encounter for screening for malignant neoplasm of cervix: Secondary | ICD-10-CM | POA: Diagnosis not present

## 2018-12-18 DIAGNOSIS — L709 Acne, unspecified: Secondary | ICD-10-CM | POA: Diagnosis not present

## 2018-12-18 DIAGNOSIS — E559 Vitamin D deficiency, unspecified: Secondary | ICD-10-CM | POA: Diagnosis not present

## 2018-12-18 DIAGNOSIS — G43909 Migraine, unspecified, not intractable, without status migrainosus: Secondary | ICD-10-CM | POA: Diagnosis not present

## 2018-12-18 DIAGNOSIS — R7303 Prediabetes: Secondary | ICD-10-CM | POA: Diagnosis not present

## 2018-12-18 DIAGNOSIS — E785 Hyperlipidemia, unspecified: Secondary | ICD-10-CM | POA: Diagnosis not present

## 2018-12-18 DIAGNOSIS — B009 Herpesviral infection, unspecified: Secondary | ICD-10-CM | POA: Diagnosis not present

## 2018-12-23 ENCOUNTER — Encounter (HOSPITAL_COMMUNITY): Payer: Self-pay

## 2018-12-23 ENCOUNTER — Ambulatory Visit (HOSPITAL_COMMUNITY)
Admission: EM | Admit: 2018-12-23 | Discharge: 2018-12-23 | Disposition: A | Discharge status: Home / Self Care | Payer: Medicaid Other | Attending: Physician Assistant | Admitting: Physician Assistant

## 2018-12-23 ENCOUNTER — Other Ambulatory Visit: Payer: Self-pay

## 2018-12-23 DIAGNOSIS — G43019 Migraine without aura, intractable, without status migrainosus: Secondary | ICD-10-CM | POA: Diagnosis not present

## 2018-12-23 MED ORDER — FLUTICASONE PROPIONATE 50 MCG/ACT NA SUSP: 2.0000 | Freq: Every day | NASAL | 0 refills | Status: DC

## 2018-12-23 MED ORDER — KETOROLAC TROMETHAMINE 30 MG/ML IJ SOLN
30.0000 mg | Freq: Once | INTRAMUSCULAR | Status: AC
  Administered 2018-12-23: 30 mg via INTRAMUSCULAR

## 2018-12-23 MED ORDER — METOCLOPRAMIDE HCL 5 MG/ML IJ SOLN
5.0000 mg | Freq: Once | INTRAMUSCULAR | Status: AC
  Administered 2018-12-23: 09:00:00 5 mg via INTRAMUSCULAR

## 2018-12-23 MED ORDER — DEXAMETHASONE SODIUM PHOSPHATE 10 MG/ML IJ SOLN
10.0000 mg | Freq: Once | INTRAMUSCULAR | Status: AC
  Administered 2018-12-23: 10 mg via INTRAMUSCULAR

## 2018-12-23 MED ORDER — DEXAMETHASONE SODIUM PHOSPHATE 10 MG/ML IJ SOLN
INTRAMUSCULAR | Status: AC
  Filled 2018-12-23: qty 1

## 2018-12-23 MED ORDER — METOCLOPRAMIDE HCL 5 MG/ML IJ SOLN
INTRAMUSCULAR | Status: AC
  Filled 2018-12-23: qty 2

## 2018-12-23 MED ORDER — KETOROLAC TROMETHAMINE 30 MG/ML IJ SOLN
INTRAMUSCULAR | Status: AC
  Filled 2018-12-23: qty 1

## 2018-12-23 NOTE — ED Provider Notes (Signed)
[facility]    CSN: [id number] Arrival date & time: 12/23/18  0840     History   Chief Complaint Chief Complaint  Patient presents with  . Migraine    HPI Brittney Watkins is a 38 y.o. female.   39 year old female with history of migraines comes in for 2-day history of migraine.  Patient states started out as sinus pressure, and thinks may be allergy related as she was outside.  Later that day, moved to the right side of the head with pounding headache.  She denies nausea, vomiting, photophobia.  Denies weakness, dizziness, syncope.  She took her triptan twice without relief.  Denies URI symptoms such as cough, congestion, sore throat.  Denies sneezing.  Denies fever, chills, night sweats.  States about twice a year, she will have a migraine that is not relieved by her triptan, and requires to come in for injections.     Past Medical History:  Diagnosis Date  . Blurred vision   . Migraine   . UTI (urinary tract infection)     Patient Active Problem List   Diagnosis Date Noted  . Migraine 09/08/2014  . Headache 09/08/2014  . Neck pain 09/08/2014    Past Surgical History:  Procedure Laterality Date  . CHOLECYSTECTOMY  2007  . TUBAL LIGATION  2006    OB History   No obstetric history on file.      Home Medications    Prior to Admission medications   Medication Sig Start Date End Date Taking? Authorizing Provider  fluticasone (FLONASE) 50 MCG/ACT nasal spray Place 2 sprays into both nostrils daily. 12/23/18   Cathie Hoops, Velma Hanna V, PA-C  Multiple Vitamin (MULTIVITAMIN WITH MINERALS) TABS tablet Take 1 tablet by mouth at bedtime.     [provider]  rizatriptan (MAXALT-MLT) 5 MG disintegrating tablet DIS 1 T ON THE TONGUE QD 09/17/18   Mardella Layman, MD    Family History Family History  Problem Relation Age of Onset  . Heart attack Father   . Diabetes Maternal Grandmother        some uncles as well   . High blood pressure Maternal Grandmother    some uncles as well  . Migraines Neg Hx     Social History Social History   Tobacco Use  . Smoking status: Never Smoker  . Smokeless tobacco: Never Used  Substance Use Topics  . Alcohol use: No    Alcohol/week: 0.0 standard drinks  . Drug use: No     Allergies   Nyquil multi-symptom [pseudoeph-doxylamine-dm-apap] and Shellfish allergy   Review of Systems Review of Systems  Reason unable to perform ROS: See HPI as above.     Physical Exam Triage Vital Signs ED Triage Vitals [12/23/18 0851]  Enc Vitals Group     BP      Pulse      Resp      Temp      Temp src      SpO2      Weight      Height      Head Circumference      Peak Flow      Pain Score 8     Pain Loc      Pain Edu?      Excl. in GC?    No data found.  Updated Vital Signs BP 132/81 (BP Location: Left Arm)   Pulse 65   Temp 97.9 F (36.6 C) (Oral)   Resp 17  SpO2 100%   Physical Exam Constitutional:      General: She is not in acute distress.    Appearance: She is well-developed. She is not ill-appearing, toxic-appearing or diaphoretic.  HENT:     Head: Normocephalic and atraumatic.     Right Ear: Tympanic membrane, ear canal and external ear normal. Tympanic membrane is not erythematous or bulging.     Left Ear: Tympanic membrane, ear canal and external ear normal. Tympanic membrane is not erythematous or bulging.     Nose:     Right Sinus: Maxillary sinus tenderness present. No frontal sinus tenderness.     Left Sinus: Maxillary sinus tenderness present. No frontal sinus tenderness.     Mouth/Throat:     Mouth: Mucous membranes are moist.     Pharynx: Oropharynx is clear. Uvula midline.  Eyes:     Extraocular Movements: Extraocular movements intact.     Conjunctiva/sclera: Conjunctivae normal.     Pupils: Pupils are equal, round, and reactive to light.     Comments: No photophobia on exam.  Neck:     Musculoskeletal: Normal range of motion and neck supple.  Cardiovascular:      Rate and Rhythm: Normal rate and regular rhythm.     Heart sounds: Normal heart sounds. No murmur. No friction rub. No gallop.   Pulmonary:     Effort: Pulmonary effort is normal. No accessory muscle usage, prolonged expiration, respiratory distress or retractions.     Breath sounds: Normal breath sounds. No stridor, decreased air movement or transmitted upper airway sounds. No decreased breath sounds, wheezing, rhonchi or rales.  Skin:    General: Skin is warm and dry.  Neurological:     Mental Status: She is alert and oriented to person, place, and time.     GCS: GCS eye subscore is 4. GCS verbal subscore is 5. GCS motor subscore is 6.     Cranial Nerves: Cranial nerves are intact.     Sensory: Sensation is intact.     Motor: Motor function is intact.     Coordination: Coordination is intact.     Gait: Gait is intact.      UC Treatments / Results  Labs (all labs ordered are listed, but only abnormal results are displayed) Labs Reviewed - No data to display  EKG None  Radiology No results found.  Procedures Procedures (including critical care time)  Medications Ordered in UC Medications  ketorolac (TORADOL) 30 MG/ML injection 30 mg (30 mg Intramuscular Given 12/23/18 0924)  metoCLOPramide (REGLAN) injection 5 mg (5 mg Intramuscular Given 12/23/18 0924)  dexamethasone (DECADRON) injection 10 mg (10 mg Intramuscular Given 12/23/18 0925)    Initial Impression / Assessment and Plan / UC Course  I have reviewed the triage vital signs and the nursing notes.  Pertinent labs & imaging results that were available during my care of the patient were reviewed by me and considered in my medical decision making (see chart for details).    Neurology exam grossly intact.  Discussed possible sinus pressure causing symptoms as well, will provide Flonase as directed.  Toradol, Reglan, Decadron injection in office today.  Push fluids.  Return precautions given.  Otherwise to follow-up with PCP  for further management of migraines.  Patient expresses understanding and agrees to plan.  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 Provider  fluticasone (FLONASE) 50 MCG/ACT nasal spray Place 2 sprays into both nostrils daily. 1 g Threasa AlphaYu, Petrina Melby V, PA-C        Wei Newbrough V, [place] 12/23/18 [phone]

## 2018-12-23 NOTE — Discharge Instructions (Signed)
Toradol, Reglan, Decadron injection in office today.  As discussed, start Flonase for possible seasonal allergies/sinus pressure causing symptoms. Keep hydrated, urine should be clear to pale yellow in color.  Follow-up with PCP for further evaluation and management of migraine.  If experiencing worse headache of your life, dizziness, weakness, passing out, go to the emergency department for further evaluation.

## 2018-12-23 NOTE — ED Triage Notes (Signed)
Patient presents to Urgent Care with complaints of headache since yesterday. Patient states she did not take otc meds pta, requesting migraine cocktail.

## 2019-03-13 DIAGNOSIS — R7303 Prediabetes: Secondary | ICD-10-CM | POA: Diagnosis not present

## 2019-03-13 DIAGNOSIS — E559 Vitamin D deficiency, unspecified: Secondary | ICD-10-CM | POA: Diagnosis not present

## 2019-03-13 DIAGNOSIS — E785 Hyperlipidemia, unspecified: Secondary | ICD-10-CM | POA: Diagnosis not present

## 2019-03-13 DIAGNOSIS — R1013 Epigastric pain: Secondary | ICD-10-CM | POA: Diagnosis not present

## 2019-03-13 DIAGNOSIS — R21 Rash and other nonspecific skin eruption: Secondary | ICD-10-CM | POA: Diagnosis not present

## 2019-03-13 DIAGNOSIS — N926 Irregular menstruation, unspecified: Secondary | ICD-10-CM | POA: Diagnosis not present

## 2019-03-24 ENCOUNTER — Ambulatory Visit (HOSPITAL_COMMUNITY)
Admission: EM | Admit: 2019-03-24 | Discharge: 2019-03-24 | Disposition: A | Discharge status: Home / Self Care | Payer: Medicaid Other | Attending: Family Medicine | Admitting: Family Medicine

## 2019-03-24 ENCOUNTER — Encounter (HOSPITAL_COMMUNITY): Payer: Self-pay

## 2019-03-24 DIAGNOSIS — R1084 Generalized abdominal pain: Secondary | ICD-10-CM

## 2019-03-24 DIAGNOSIS — R110 Nausea: Secondary | ICD-10-CM

## 2019-03-24 LAB — POCT URINALYSIS DIP (DEVICE)
Bilirubin Urine: NEGATIVE
Glucose, UA: NEGATIVE mg/dL
Hgb urine dipstick: NEGATIVE
Ketones, ur: NEGATIVE mg/dL
Nitrite: NEGATIVE
Protein, ur: NEGATIVE mg/dL
Specific Gravity, Urine: 1.02 (ref 1.005–1.030)
Urobilinogen, UA: 0.2 mg/dL (ref 0.0–1.0)
pH: 7.5 (ref 5.0–8.0)

## 2019-03-24 MED ORDER — DICYCLOMINE HCL 20 MG PO TABS: 20.0000 mg | ORAL_TABLET | Freq: Three times a day (TID) | ORAL | 0 refills | Status: DC

## 2019-03-24 MED ORDER — KETOROLAC TROMETHAMINE 30 MG/ML IJ SOLN
15.0000 mg | Freq: Once | INTRAMUSCULAR | Status: AC
  Administered 2019-03-24: 15 mg via INTRAMUSCULAR

## 2019-03-24 MED ORDER — KETOROLAC TROMETHAMINE 30 MG/ML IJ SOLN
INTRAMUSCULAR | Status: AC
  Filled 2019-03-24: qty 1

## 2019-03-24 MED ORDER — ONDANSETRON 4 MG PO TBDP: 4.0000 mg | ORAL_TABLET | Freq: Three times a day (TID) | ORAL | 0 refills | Status: DC | PRN

## 2019-03-24 NOTE — ED Provider Notes (Signed)
[facility]    CSN: [id number] Arrival date & time: 03/24/19  3220     History   Chief Complaint Chief Complaint  Patient presents with  . Abdominal Pain    cramps     HPI Brittney Watkins is a 38 y.o. female history of previous cholecystectomy, tubal ligation, migraines, presenting today for evaluation of abdominal pain.  Patient has had abdominal pain beginning yesterday afternoon.  Has had some nausea and vomiting.  Pain is generalized, occasionally will have a sharper pains on her right lower side.  States that it feels very similar to her typical menstrual cramping and was set to start her menstrual cycle on Saturday.  States that bleeding is usually very minimal.  States that recently she has had increased similar cramping sensations in relation to her menstrual cycle, has discussed this with her OB/GYN and at one point was going to have an ultrasound, but due to [place] pandemic, this was delayed.  She denies associated respiratory symptoms of cough congestion or sore throat.  Denies fevers and body aches.  Has had mild chills this morning, but this resolved.  Denies close contacts with similar symptoms.  Denies dysuria, increased urgency or hematuria.  Has had some frequency, but this is been present over the past year or 2 since being pregnant.  Denies abnormal discharge or concern for STDs.  HPI  Past Medical History:  Diagnosis Date  . Blurred vision   . Migraine   . UTI (urinary tract infection)     Patient Active Problem List   Diagnosis Date Noted  . Migraine 09/08/2014  . Headache 09/08/2014  . Neck pain 09/08/2014    Past Surgical History:  Procedure Laterality Date  . CHOLECYSTECTOMY  2007  . TUBAL LIGATION  2006    OB History   No obstetric history on file.      Home Medications    Prior to Admission medications   Medication Sig Start Date End Date Taking? Authorizing Provider  dicyclomine (BENTYL) 20 MG tablet Take 1 tablet (20 mg total)  by mouth 4 (four) times daily -  before meals and at bedtime. 03/24/19   Neria Procter C, PA-C  fluticasone (FLONASE) 50 MCG/ACT nasal spray Place 2 sprays into both nostrils daily. 12/23/18   Tasia Catchings, Amy V, PA-C  Multiple Vitamin (MULTIVITAMIN WITH MINERALS) TABS tablet Take 1 tablet by mouth at bedtime.     [provider]  ondansetron (ZOFRAN ODT) 4 MG disintegrating tablet Take 1 tablet (4 mg total) by mouth every 8 (eight) hours as needed for nausea or vomiting. 03/24/19   Lunden Stieber C, PA-C  rizatriptan (MAXALT-MLT) 5 MG disintegrating tablet DIS 1 T ON THE TONGUE QD 09/17/18   Vanessa Kick, MD    Family History Family History  Problem Relation Age of Onset  . Heart attack Father   . Diabetes Maternal Grandmother        some uncles as well   . High blood pressure Maternal Grandmother        some uncles as well  . Migraines Neg Hx     Social History Social History   Tobacco Use  . Smoking status: Never Smoker  . Smokeless tobacco: Never Used  Substance Use Topics  . Alcohol use: No    Alcohol/week: 0.0 standard drinks  . Drug use: No     Allergies   Nyquil multi-symptom [pseudoeph-doxylamine-dm-apap] and Shellfish allergy   Review of Systems Review of Systems  Constitutional:  Negative for activity change, appetite change, chills, fatigue and fever.  HENT: Negative for congestion, ear pain, rhinorrhea, sinus pressure, sore throat and trouble swallowing.   Eyes: Negative for discharge and redness.  Respiratory: Negative for cough, chest tightness and shortness of breath.   Cardiovascular: Negative for chest pain.  Gastrointestinal: Positive for abdominal pain, nausea and vomiting. Negative for diarrhea.  Genitourinary: Positive for frequency. Negative for dysuria, hematuria, urgency and vaginal discharge.  Musculoskeletal: Negative for myalgias.  Skin: Negative for rash.  Neurological: Negative for dizziness, light-headedness and headaches.     Physical  Exam Triage Vital Signs ED Triage Vitals  Enc Vitals Group     BP      Pulse      Resp      Temp      Temp src      SpO2      Weight      Height      Head Circumference      Peak Flow      Pain Score      Pain Loc      Pain Edu?      Excl. in GC?    No data found.  Updated Vital Signs BP 115/79 (BP Location: Right Arm)   Pulse 88   Temp 98.6 F (37 C) (Oral)   Resp 16   LMP 02/28/2019   SpO2 100%   Visual Acuity Right Eye Distance:   Left Eye Distance:   Bilateral Distance:    Right Eye Near:   Left Eye Near:    Bilateral Near:     Physical Exam Vitals signs and nursing note reviewed.  Constitutional:      General: She is not in acute distress.    Appearance: She is well-developed.  HENT:     Head: Normocephalic and atraumatic.     Mouth/Throat:     Comments: Oral mucosa pink and moist, no tonsillar enlargement or exudate. Posterior pharynx patent and nonerythematous, no uvula deviation or swelling. Normal phonation. Eyes:     Conjunctiva/sclera: Conjunctivae normal.  Neck:     Musculoskeletal: Neck supple.  Cardiovascular:     Rate and Rhythm: Normal rate and regular rhythm.     Heart sounds: No murmur.  Pulmonary:     Effort: Pulmonary effort is normal. No respiratory distress.     Breath sounds: Normal breath sounds.  Abdominal:     Palpations: Abdomen is soft.     Tenderness: There is no abdominal tenderness.     Comments: Generalized tenderness throughout abdomen, no focal tenderness, negative rebound, negative Rovsing, negative McBurney's, negative Murphy's; denies pain worse in one area compared to others with palpation  Musculoskeletal:     Comments: Mild lower back tenderness to palpation throughout lumbar region  Skin:    General: Skin is warm and dry.  Neurological:     Mental Status: She is alert.      UC Treatments / Results  Labs (all labs ordered are listed, but only abnormal results are displayed) Labs Reviewed  POCT  URINALYSIS DIP (DEVICE) - Abnormal; Notable for the following components:      Result Value   Leukocytes,Ua TRACE (*)    All other components within normal limits    EKG   Radiology No results found.  Procedures Procedures (including critical care time)  Medications Ordered in UC Medications  ketorolac (TORADOL) 30 MG/ML injection 15 mg (has no administration in time range)  ketorolac (TORADOL) 30 MG/ML  injection (has no administration in time range)    Initial Impression / Assessment and Plan / UC Course  I have reviewed the triage vital signs and the nursing notes.  Pertinent labs & imaging results that were available during my care of the patient were reviewed by me and considered in my medical decision making (see chart for details).     UA with trace leuks, no glucose noted, nonsuggestive of UTI.  Possible viral gastroenteritis versus related to menstrual cramping as patient reports.  Discussed options of treating pain with patient.  Advised trial of Bentyl to help with any GI cramping.  Given patient feels this is very similar to her past cramping she has had associated with menstrual cycle, did opt for trial of 15 mg of Toradol to help with her pain as she has had no relief with over-the-counter and oral Naprosyn.  Discussed risk of concern for masking pain/bleeing, 30 shared decision making opted to proceed with Toradol trial.  Advised to keep a very close eye on pain and follow-up in emergency room if having worsening pain, or pain localizing to right lower side.  Push fluids, follow-up if symptoms not improving.  Follow-up with OB/GYN for persistent menstrual related cramping concerns.  Discussed strict return precautions. Patient verbalized understanding and is agreeable with plan.  Final Clinical Impressions(s) / UC Diagnoses   Final diagnoses:  Generalized abdominal pain  Nausea     Discharge Instructions     We gave you a shot of Toradol to help with discomfort  Please use Zofran as needed for nausea and vomiting, dissolves underneath tongue May try Bentyl as alternative for cramping  Please keep an eye on your pain if pain worsening, developing fevers, persistent vomiting, pain moving into right lower area please follow-up in emergency room.    ED Prescriptions    Medication Sig Dispense Auth. Provider   ondansetron (ZOFRAN ODT) 4 MG disintegrating tablet Take 1 tablet (4 mg total) by mouth every 8 (eight) hours as needed for nausea or vomiting. 20 tablet Jerzie Bieri C, PA-C   dicyclomine (BENTYL) 20 MG tablet Take 1 tablet (20 mg total) by mouth 4 (four) times daily -  before meals and at bedtime. 16 tablet Bain Whichard, CascadeHallie C, PA-C     Controlled Substance Prescriptions [place] Controlled Substance Registry consulted? Not Applicable   Lew DawesWieters, Stacia Feazell C, [place]-C 03/24/19 1047

## 2019-03-24 NOTE — ED Triage Notes (Signed)
Pt C/O severe abdominal pain, with some vomiting. symptoms started on Sunday 7/5.  Pt states the abdominal is all over her stomach with some fewer sharp pains on the lower right side. She has tried some OTC medication with no relief.

## 2019-03-24 NOTE — Discharge Instructions (Signed)
We gave you a shot of Toradol to help with discomfort Please use Zofran as needed for nausea and vomiting, dissolves underneath tongue May try Bentyl as alternative for cramping  Please keep an eye on your pain if pain worsening, developing fevers, persistent vomiting, pain moving into right lower area please follow-up in emergency room.

## 2019-03-27 ENCOUNTER — Emergency Department (HOSPITAL_COMMUNITY): Payer: Medicaid Other

## 2019-03-27 ENCOUNTER — Emergency Department (HOSPITAL_COMMUNITY)
Admission: EM | Admit: 2019-03-27 | Discharge: 2019-03-27 | Disposition: A | Discharge status: Home / Self Care | Payer: Medicaid Other | Attending: Emergency Medicine | Admitting: Emergency Medicine

## 2019-03-27 ENCOUNTER — Other Ambulatory Visit: Payer: Self-pay

## 2019-03-27 DIAGNOSIS — R102 Pelvic and perineal pain: Secondary | ICD-10-CM

## 2019-03-27 DIAGNOSIS — R110 Nausea: Secondary | ICD-10-CM | POA: Insufficient documentation

## 2019-03-27 LAB — URINALYSIS, ROUTINE W REFLEX MICROSCOPIC
Bacteria, UA: NONE SEEN
Bilirubin Urine: NEGATIVE
Glucose, UA: NEGATIVE mg/dL
Ketones, ur: NEGATIVE mg/dL
Leukocytes,Ua: NEGATIVE
Nitrite: NEGATIVE
Protein, ur: NEGATIVE mg/dL
RBC / HPF: >50 RBC/hpf — ABNORMAL HIGH (ref 0–5)
Specific Gravity, Urine: 1.006 (ref 1.005–1.030)
pH: 7 (ref 5.0–8.0)

## 2019-03-27 LAB — COMPREHENSIVE METABOLIC PANEL
ALT: 14 U/L (ref 0–44)
AST: 23 U/L (ref 15–41)
Albumin: 4.1 g/dL (ref 3.5–5.0)
Alkaline Phosphatase: 54 U/L (ref 38–126)
Anion gap: 11 (ref 5–15)
BUN: 7 mg/dL (ref 6–20)
CO2: 23 mmol/L (ref 22–32)
Calcium: 9.3 mg/dL (ref 8.9–10.3)
Chloride: 103 mmol/L (ref 98–111)
Creatinine, Ser: 0.99 mg/dL (ref 0.44–1.00)
GFR calc Af Amer: >60 mL/min (ref >60)
GFR calc non Af Amer: >60 mL/min (ref >60)
Glucose, Bld: 107 mg/dL — ABNORMAL HIGH (ref 70–99)
Potassium: 4 mmol/L (ref 3.5–5.1)
Sodium: 137 mmol/L (ref 135–145)
Total Bilirubin: 0.7 mg/dL (ref 0.3–1.2)
Total Protein: 7.1 g/dL (ref 6.5–8.1)

## 2019-03-27 LAB — CBC
HCT: 34.4 % — ABNORMAL LOW (ref 36.0–46.0)
Hemoglobin: 10.9 g/dL — ABNORMAL LOW (ref 12.0–15.0)
MCH: 26.2 pg (ref 26.0–34.0)
MCHC: 31.7 g/dL (ref 30.0–36.0)
MCV: 82.7 fL (ref 80.0–100.0)
Platelets: 160 10*3/uL (ref 150–400)
RBC: 4.16 MIL/uL (ref 3.87–5.11)
RDW: 15.3 % (ref 11.5–15.5)
WBC: 6.2 10*3/uL (ref 4.0–10.5)
nRBC: 0 % (ref 0.0–0.2)

## 2019-03-27 LAB — LIPASE, BLOOD: Lipase: 27 U/L (ref 11–51)

## 2019-03-27 LAB — I-STAT BETA HCG BLOOD, ED (MC, WL, AP ONLY): I-stat hCG, quantitative: <5 m[IU]/mL (ref <5)

## 2019-03-27 MED ORDER — ACETAMINOPHEN 500 MG PO TABS
1000.0000 mg | ORAL_TABLET | Freq: Once | ORAL | Status: AC
  Administered 2019-03-27: 1000 mg via ORAL
  Filled 2019-03-27: qty 2

## 2019-03-27 MED ORDER — SODIUM CHLORIDE 0.9% FLUSH: 3.0000 mL | Freq: Once | INTRAVENOUS | Status: DC

## 2019-03-27 NOTE — ED Triage Notes (Signed)
Patient here with abdominal pain which started on Sunday.  Patient states she went to [facility] yesterday for the same.  She states that it is all in her abdomen and she states that this is worse during her menstrual cycle.  She has not gone to an OBGYN for this at this time.  Patient denies pregnancy.  She states she has had cysts in the past.

## 2019-03-27 NOTE — ED Notes (Signed)
Patient reports abdominal pain that started 6 months ago and has increasingly got worse. She states her PCP was going to order an ultrasound but she has not received a call from that office

## 2019-03-27 NOTE — ED Provider Notes (Signed)
[facility] Provider Note   CSN: [id number] Arrival date & time: 03/27/19  0443    History   Chief Complaint Chief Complaint  Patient presents with   Abdominal Pain    HPI Brittney Watkins is a 38 y.o. female with a past medical history of tubal ligation, migraines, cholecystectomy, who presents today for evaluation of abdominal pain.  She reports that since Sunday she has had pelvic pain.  She states that over the past 6 months with every menstrual cycle she is having worse and worse pain.  The pain typically starts in her lower abdomen bilaterally spreading into her general abdomen and into her back.  She states that that is how this episode began.  She reports that with these episodes she generally has nausea and decreased appetite.  She denies any fevers.  She has been seen for this by urgent care, and her PCP.  She was reportedly scheduled for an ultrasound however due to the national coronavirus pandemic she has not been able to obtain that yet.  She has tried Tylenol and Midol last night without relief. She states that she has been previously evaluated for STIs and had a pelvic exam by her primary care doctor which was reportedly unremarkable.  She denies any vaginal discharge prior to the onset of her menstrual cycle.         HPI  Past Medical History:  Diagnosis Date   Blurred vision    Migraine    UTI (urinary tract infection)     Patient Active Problem List   Diagnosis Date Noted   Migraine 09/08/2014   Headache 09/08/2014   Neck pain 09/08/2014    Past Surgical History:  Procedure Laterality Date   CHOLECYSTECTOMY  2007   TUBAL LIGATION  2006     OB History   No obstetric history on file.      Home Medications    Prior to Admission medications   Medication Sig Start Date End Date Taking? Authorizing Provider  Multiple Vitamin (MULTIVITAMIN WITH MINERALS) TABS tablet Take 1 tablet by mouth at bedtime.     Yes [provider]  rizatriptan (MAXALT-MLT) 5 MG disintegrating tablet DIS 1 T ON THE TONGUE QD Patient taking differently: Take 5 mg by mouth as needed for migraine.  09/17/18  Yes Mardella LaymanHagler, Brian, MD  dicyclomine (BENTYL) 20 MG tablet Take 1 tablet (20 mg total) by mouth 4 (four) times daily -  before meals and at bedtime. Patient not taking: Reported on 03/27/2019 03/24/19   Wieters, Hallie C, PA-C  fluticasone (FLONASE) 50 MCG/ACT nasal spray Place 2 sprays into both nostrils daily. Patient not taking: Reported on 03/27/2019 12/23/18   Belinda FisherYu, Amy V, PA-C  ondansetron (ZOFRAN ODT) 4 MG disintegrating tablet Take 1 tablet (4 mg total) by mouth every 8 (eight) hours as needed for nausea or vomiting. Patient not taking: Reported on 03/27/2019 03/24/19   Lew DawesWieters, Hallie C, PA-C    Family History Family History  Problem Relation Age of Onset   Heart attack Father    Diabetes Maternal Grandmother        some uncles as well    High blood pressure Maternal Grandmother        some uncles as well   Migraines Neg Hx     Social History Social History   Tobacco Use   Smoking status: Never Smoker   Smokeless tobacco: Never Used  Substance Use Topics   Alcohol use: No  Alcohol/week: 0.0 standard drinks   Drug use: No     Allergies   Nyquil multi-symptom [pseudoeph-doxylamine-dm-apap] and Shellfish allergy   Review of Systems Review of Systems  Constitutional: Negative for chills, fatigue and fever.  HENT: Negative for congestion.   Respiratory: Negative for shortness of breath.   Gastrointestinal: Positive for abdominal pain and nausea. Negative for diarrhea and vomiting.  Genitourinary: Positive for pelvic pain. Negative for dysuria, frequency, urgency, vaginal bleeding, vaginal discharge and vaginal pain.  Musculoskeletal: Positive for back pain.  Neurological: Negative for weakness.  Psychiatric/Behavioral: Negative for confusion.  All other systems reviewed and are  negative.    Physical Exam Updated Vital Signs BP 102/66    Pulse (!) 57    Temp 98.5 F (36.9 C) (Oral)    Resp 13    LMP 03/26/2019 (Exact Date)    SpO2 100%   Physical Exam Vitals signs and nursing note reviewed.  Constitutional:      General: She is not in acute distress.    Appearance: She is well-developed. She is not diaphoretic.  HENT:     Head: Normocephalic and atraumatic.     Mouth/Throat:     Mouth: Mucous membranes are moist.  Eyes:     General: No scleral icterus.       Right eye: No discharge.        Left eye: No discharge.     Conjunctiva/sclera: Conjunctivae normal.  Neck:     Musculoskeletal: Normal range of motion.  Cardiovascular:     Rate and Rhythm: Normal rate and regular rhythm.     Heart sounds: Normal heart sounds.  Pulmonary:     Effort: Pulmonary effort is normal. No respiratory distress.     Breath sounds: No stridor.  Abdominal:     General: Bowel sounds are normal. There is no distension.     Palpations: Abdomen is soft.     Tenderness: There is abdominal tenderness in the right lower quadrant, suprapubic area and left lower quadrant.     Hernia: No hernia is present.  Genitourinary:    Comments: Patient declined Musculoskeletal:        General: No deformity.  Skin:    General: Skin is warm and dry.  Neurological:     General: No focal deficit present.     Mental Status: She is alert and oriented to person, place, and time.     Motor: No abnormal muscle tone.  Psychiatric:        Mood and Affect: Mood normal.        Behavior: Behavior normal.      ED Treatments / Results  Labs (all labs ordered are listed, but only abnormal results are displayed) Labs Reviewed  COMPREHENSIVE METABOLIC PANEL - Abnormal; Notable for the following components:      Result Value   Glucose, Bld 107 (*)    All other components within normal limits  CBC - Abnormal; Notable for the following components:   Hemoglobin 10.9 (*)    HCT 34.4 (*)    All  other components within normal limits  URINALYSIS, ROUTINE W REFLEX MICROSCOPIC - Abnormal; Notable for the following components:   Color, Urine STRAW (*)    Hgb urine dipstick MODERATE (*)    RBC / HPF >50 (*)    All other components within normal limits  LIPASE, BLOOD  I-STAT BETA HCG BLOOD, ED ([facility], [facility], [facility] ONLY)    EKG None  Radiology US Transvaginal Non-ob  Result Date: 03/27/2019 CLINICAL DATA:  Pelvic pain worse with every cycle EXAM: TRANSABDOMINAL AND TRANSVAGINAL ULTRASOUND OF PELVIS DOPPLER ULTRASOUND OF OVARIES TECHNIQUE: Both transabdominal and transvaginal ultrasound examinations of the pelvis were performed. Transabdominal technique was performed for global imaging of the pelvis including uterus, ovaries, adnexal regions, and pelvic cul-de-sac. It was necessary to proceed with endovaginal exam following the transabdominal exam to visualize the endometrium and ovaries. Color and duplex Doppler ultrasound was utilized to evaluate blood flow to the ovaries. COMPARISON:  07/24/2015 FINDINGS: Uterus Measurements: 8.3 x 4.7 x 5.7 cm = volume: 117 mL. No fibroids or other mass visualized. Endometrium Thickness: 8.2 mm.  No focal abnormality visualized. Right ovary Measurements: 3.3 x 2.3 x 2.8 cm = volume: 11.3 mL. Normal appearance/no adnexal mass. Left ovary Measurements: 2.7 x 2.2 x 1.9 cm = volume: 6 mL. Normal appearance/no adnexal mass. Pulsed Doppler evaluation of both ovaries demonstrates normal low-resistance arterial and venous waveforms. Other findings Trace pelvic free fluid. IMPRESSION: 1. Normal pelvic ultrasound. Electronically Signed   By: Elige KoHetal  Patel   On: 03/27/2019 10:11   [place] Pelvis Complete  Result Date: 03/27/2019 CLINICAL DATA:  Pelvic pain worse with every cycle EXAM: TRANSABDOMINAL AND TRANSVAGINAL ULTRASOUND OF PELVIS DOPPLER ULTRASOUND OF OVARIES TECHNIQUE: Both transabdominal and transvaginal ultrasound examinations of the pelvis were performed. Transabdominal  technique was performed for global imaging of the pelvis including uterus, ovaries, adnexal regions, and pelvic cul-de-sac. It was necessary to proceed with endovaginal exam following the transabdominal exam to visualize the endometrium and ovaries. Color and duplex Doppler ultrasound was utilized to evaluate blood flow to the ovaries. COMPARISON:  07/24/2015 FINDINGS: Uterus Measurements: 8.3 x 4.7 x 5.7 cm = volume: 117 mL. No fibroids or other mass visualized. Endometrium Thickness: 8.2 mm.  No focal abnormality visualized. Right ovary Measurements: 3.3 x 2.3 x 2.8 cm = volume: 11.3 mL. Normal appearance/no adnexal mass. Left ovary Measurements: 2.7 x 2.2 x 1.9 cm = volume: 6 mL. Normal appearance/no adnexal mass. Pulsed Doppler evaluation of both ovaries demonstrates normal low-resistance arterial and venous waveforms. Other findings Trace pelvic free fluid. IMPRESSION: 1. Normal pelvic ultrasound. Electronically Signed   By: Elige KoHetal  Patel   On: 03/27/2019 10:11   [place] Art/ven Flow Abd Pelv Doppler  Result Date: 03/27/2019 CLINICAL DATA:  Pelvic pain worse with every cycle EXAM: TRANSABDOMINAL AND TRANSVAGINAL ULTRASOUND OF PELVIS DOPPLER ULTRASOUND OF OVARIES TECHNIQUE: Both transabdominal and transvaginal ultrasound examinations of the pelvis were performed. Transabdominal technique was performed for global imaging of the pelvis including uterus, ovaries, adnexal regions, and pelvic cul-de-sac. It was necessary to proceed with endovaginal exam following the transabdominal exam to visualize the endometrium and ovaries. Color and duplex Doppler ultrasound was utilized to evaluate blood flow to the ovaries. COMPARISON:  07/24/2015 FINDINGS: Uterus Measurements: 8.3 x 4.7 x 5.7 cm = volume: 117 mL. No fibroids or other mass visualized. Endometrium Thickness: 8.2 mm.  No focal abnormality visualized. Right ovary Measurements: 3.3 x 2.3 x 2.8 cm = volume: 11.3 mL. Normal appearance/no adnexal mass. Left ovary  Measurements: 2.7 x 2.2 x 1.9 cm = volume: 6 mL. Normal appearance/no adnexal mass. Pulsed Doppler evaluation of both ovaries demonstrates normal low-resistance arterial and venous waveforms. Other findings Trace pelvic free fluid. IMPRESSION: 1. Normal pelvic ultrasound. Electronically Signed   By: Elige KoHetal  Patel   On: 03/27/2019 10:11    Procedures Procedures (including critical care time)  Medications Ordered in ED Medications  sodium chloride flush (NS) 0.9 %  injection 3 mL (has no administration in time range)     Initial Impression / Assessment and Plan / ED Course  I have reviewed the triage vital signs and the nursing notes.  Pertinent labs & imaging results that were available during my care of the patient were reviewed by me and considered in my medical decision making (see chart for details).       Patient presents today for evaluation of pelvic pain.  Over the past 6 months she has had worsening bilateral lower abdomen/pelvic pain that spreads into her abdomen and into her back occurring near the onset of her menstrual cycle.  She is status post bilateral tubal ligation.  She has previously been seen and evaluated for this by her primary care doctor, however the symptoms are worse today.  She denies any concern of STI.  She and I discussed the utility of a pelvic exam in the setting of pelvic pain, however she made the informed decision to decline as she states she has recently had this by her primary care doctor for the same concern.  She elected for ultrasound instead.  Ultrasound was obtained without evidence of cyst, torsion, or other significant abnormality.  Labs are obtained and reviewed, pregnancy test is negative.  Urine has over 50 RBCs, however patient is currently on her menstrual cycle, not having urinary symptoms so this is attributed to contamination.  Is mild anemia with a hemoglobin of 10.9.  She does not have other significant hematologic or electrolyte  derangements.  Based on the nature of her pain suspect painful menstrual cycles versus possible endometriosis as primary cause for her symptoms.  She is given OB/GYN follow-up.  She was given a dose of Tylenol while in the emergency room.  She is given instructions on OTC ibuprofen and Tylenol at home.  While Tylenol causes a contraindication as she gets a rash with NyQuil, she has been taking Tylenol at home with out reaction.  Return precautions were discussed with patient who states their understanding.  At the time of discharge patient denied any unaddressed complaints or concerns.  Patient is agreeable for discharge home.   Final Clinical Impressions(s) / ED Diagnoses   Final diagnoses:  Pelvic pain in female    ED Discharge Orders    None       Cristina GongHammond, Bunnie Rehberg W, [place] JerseyPA-C 03/27/19 1106    Tegeler, Canary Brimhristopher J, MD 03/27/19 [phone]

## 2019-03-27 NOTE — Discharge Instructions (Addendum)

## 2019-04-10 ENCOUNTER — Telehealth (INDEPENDENT_AMBULATORY_CARE_PROVIDER_SITE_OTHER): Payer: Medicaid Other | Admitting: Obstetrics & Gynecology

## 2019-04-10 ENCOUNTER — Encounter: Payer: Self-pay | Admitting: Obstetrics & Gynecology

## 2019-04-10 ENCOUNTER — Other Ambulatory Visit: Payer: Self-pay

## 2019-04-10 DIAGNOSIS — R102 Pelvic and perineal pain: Secondary | ICD-10-CM

## 2019-04-10 NOTE — Progress Notes (Signed)
TELEHEALTH GYNECOLOGY VIRTUAL VIDEO VISIT ENCOUNTER NOTE  Provider location: [facility] for [employer] [facility]   I connected with Garry Heater on 04/10/19 at  1:55 PM EDT by MyChart Video Encounter at home and verified that I am speaking with the correct person using two identifiers.   I discussed the limitations, risks, security and privacy concerns of performing an evaluation and management service virtually and the availability of in person appointments. I also discussed with the patient that there may be a patient responsible charge related to this service. The patient expressed understanding and agreed to proceed.   History:  Brittney Watkins is a 38 y.o. G2P2002 LMP 7/6-04/2019 female being evaluated today for eval of pelvic pain. Pt reports several months of pain. She has been seen in the urgent care and by her primary care provider. She was prev followed by a GYN provider who has retired. She reports that initially her sx were only with her cycles but, now they occur after intercourse. She has noticed post coital bleeding 3x. She denies any abnormal vaginal discharge. She was prev on the contraceptvie patch and was prev on OCPs. She is s/p BTL 13 years prev.       Past Medical History:  Diagnosis Date   Blurred vision    Migraine    UTI (urinary tract infection)    Past Surgical History:  Procedure Laterality Date   CHOLECYSTECTOMY  2007   TUBAL LIGATION  2006   The following portions of the patient's history were reviewed and updated as appropriate: allergies, current medications, past family history, past medical history, past social history, past surgical history and problem list.   Health Maintenance:  Normal pap and negative HRHPV 2 months.  Mammogram- N/A/  Review of Systems:  Pertinent items noted in HPI and remainder of comprehensive ROS otherwise negative.  Physical Exam:   General:  Alert, oriented and cooperative. Patient appears to be in no  acute distress.  Mental Status: Normal mood and affect. Normal behavior. Normal judgment and thought content.   Respiratory: Normal respiratory effort, no problems with respiration noted  Rest of physical exam deferred due to type of encounter  Labs and Imaging No results found for this or any previous visit (from the past 336 hour(s)). US Transvaginal Non-ob  Result Date: 03/27/2019 CLINICAL DATA:  Pelvic pain worse with every cycle EXAM: TRANSABDOMINAL AND TRANSVAGINAL ULTRASOUND OF PELVIS DOPPLER ULTRASOUND OF OVARIES TECHNIQUE: Both transabdominal and transvaginal ultrasound examinations of the pelvis were performed. Transabdominal technique was performed for global imaging of the pelvis including uterus, ovaries, adnexal regions, and pelvic cul-de-sac. It was necessary to proceed with endovaginal exam following the transabdominal exam to visualize the endometrium and ovaries. Color and duplex Doppler ultrasound was utilized to evaluate blood flow to the ovaries. COMPARISON:  07/24/2015 FINDINGS: Uterus Measurements: 8.3 x 4.7 x 5.7 cm = volume: 117 mL. No fibroids or other mass visualized. Endometrium Thickness: 8.2 mm.  No focal abnormality visualized. Right ovary Measurements: 3.3 x 2.3 x 2.8 cm = volume: 11.3 mL. Normal appearance/no adnexal mass. Left ovary Measurements: 2.7 x 2.2 x 1.9 cm = volume: 6 mL. Normal appearance/no adnexal mass. Pulsed Doppler evaluation of both ovaries demonstrates normal low-resistance arterial and venous waveforms. Other findings Trace pelvic free fluid. IMPRESSION: 1. Normal pelvic ultrasound. Electronically Signed   By: Kathreen Devoid   On: 03/27/2019 10:11   US Pelvis Complete  Result Date: 03/27/2019 CLINICAL DATA:  Pelvic pain  worse with every cycle EXAM: TRANSABDOMINAL AND TRANSVAGINAL ULTRASOUND OF PELVIS DOPPLER ULTRASOUND OF OVARIES TECHNIQUE: Both transabdominal and transvaginal ultrasound examinations of the pelvis were performed. Transabdominal technique  was performed for global imaging of the pelvis including uterus, ovaries, adnexal regions, and pelvic cul-de-sac. It was necessary to proceed with endovaginal exam following the transabdominal exam to visualize the endometrium and ovaries. Color and duplex Doppler ultrasound was utilized to evaluate blood flow to the ovaries. COMPARISON:  07/24/2015 FINDINGS: Uterus Measurements: 8.3 x 4.7 x 5.7 cm = volume: 117 mL. No fibroids or other mass visualized. Endometrium Thickness: 8.2 mm.  No focal abnormality visualized. Right ovary Measurements: 3.3 x 2.3 x 2.8 cm = volume: 11.3 mL. Normal appearance/no adnexal mass. Left ovary Measurements: 2.7 x 2.2 x 1.9 cm = volume: 6 mL. Normal appearance/no adnexal mass. Pulsed Doppler evaluation of both ovaries demonstrates normal low-resistance arterial and venous waveforms. Other findings Trace pelvic free fluid. IMPRESSION: 1. Normal pelvic ultrasound. Electronically Signed   By: Elige KoHetal  Patel   On: 03/27/2019 10:11   [place] Art/ven Flow Abd Pelv Doppler  Result Date: 03/27/2019 CLINICAL DATA:  Pelvic pain worse with every cycle EXAM: TRANSABDOMINAL AND TRANSVAGINAL ULTRASOUND OF PELVIS DOPPLER ULTRASOUND OF OVARIES TECHNIQUE: Both transabdominal and transvaginal ultrasound examinations of the pelvis were performed. Transabdominal technique was performed for global imaging of the pelvis including uterus, ovaries, adnexal regions, and pelvic cul-de-sac. It was necessary to proceed with endovaginal exam following the transabdominal exam to visualize the endometrium and ovaries. Color and duplex Doppler ultrasound was utilized to evaluate blood flow to the ovaries. COMPARISON:  07/24/2015 FINDINGS: Uterus Measurements: 8.3 x 4.7 x 5.7 cm = volume: 117 mL. No fibroids or other mass visualized. Endometrium Thickness: 8.2 mm.  No focal abnormality visualized. Right ovary Measurements: 3.3 x 2.3 x 2.8 cm = volume: 11.3 mL. Normal appearance/no adnexal mass. Left ovary Measurements:  2.7 x 2.2 x 1.9 cm = volume: 6 mL. Normal appearance/no adnexal mass. Pulsed Doppler evaluation of both ovaries demonstrates normal low-resistance arterial and venous waveforms. Other findings Trace pelvic free fluid. IMPRESSION: 1. Normal pelvic ultrasound. Electronically Signed   By: Elige KoHetal  Patel   On: 03/27/2019 10:11       Assessment and Plan:     Pelvic pain in female- pt suspects that she has endometrioses but, she does not want to try  meds because of concern for side effects. I have explained to her the risks of surgery  and the benefits of attempting meds prior to an OR.      Pt is having post coital bleeding and needs cx and pelvic exam.     I discussed the assessment and treatment plan with the patient. The patient was provided an opportunity to ask questions and all were answered. The patient agreed with the plan and demonstrated an understanding of the instructions.   The patient was advised to call back or seek an in-person evaluation/go to the ED if the symptoms worsen or if the condition fails to improve as anticipated.  I provided 15 minutes of face-to-face time during this encounter.   Willodean Rosenthalarolyn Harraway-Smith, MD [facility], [facility]

## 2019-04-18 DIAGNOSIS — N926 Irregular menstruation, unspecified: Secondary | ICD-10-CM | POA: Diagnosis not present

## 2019-04-18 DIAGNOSIS — R1013 Epigastric pain: Secondary | ICD-10-CM | POA: Diagnosis not present

## 2019-04-18 DIAGNOSIS — E559 Vitamin D deficiency, unspecified: Secondary | ICD-10-CM | POA: Diagnosis not present

## 2019-04-18 DIAGNOSIS — G43909 Migraine, unspecified, not intractable, without status migrainosus: Secondary | ICD-10-CM | POA: Diagnosis not present

## 2019-04-18 DIAGNOSIS — E785 Hyperlipidemia, unspecified: Secondary | ICD-10-CM | POA: Diagnosis not present

## 2019-04-18 DIAGNOSIS — R7303 Prediabetes: Secondary | ICD-10-CM | POA: Diagnosis not present

## 2019-05-27 ENCOUNTER — Ambulatory Visit (HOSPITAL_COMMUNITY)
Admission: EM | Admit: 2019-05-27 | Discharge: 2019-05-27 | Disposition: A | Discharge status: Home / Self Care | Payer: Medicaid Other | Attending: Family Medicine | Admitting: Family Medicine

## 2019-05-27 ENCOUNTER — Other Ambulatory Visit: Payer: Self-pay

## 2019-05-27 ENCOUNTER — Encounter (HOSPITAL_COMMUNITY): Payer: Self-pay

## 2019-05-27 DIAGNOSIS — G43009 Migraine without aura, not intractable, without status migrainosus: Secondary | ICD-10-CM | POA: Diagnosis not present

## 2019-05-27 MED ORDER — DEXAMETHASONE SODIUM PHOSPHATE 10 MG/ML IJ SOLN
INTRAMUSCULAR | Status: AC
  Filled 2019-05-27: qty 1

## 2019-05-27 MED ORDER — METOCLOPRAMIDE HCL 5 MG/ML IJ SOLN
INTRAMUSCULAR | Status: AC
  Filled 2019-05-27: qty 2

## 2019-05-27 MED ORDER — KETOROLAC TROMETHAMINE 60 MG/2ML IM SOLN
60.0000 mg | Freq: Once | INTRAMUSCULAR | Status: AC
  Administered 2019-05-27: 60 mg via INTRAMUSCULAR

## 2019-05-27 MED ORDER — DEXAMETHASONE SODIUM PHOSPHATE 10 MG/ML IJ SOLN
10.0000 mg | Freq: Once | INTRAMUSCULAR | Status: AC
  Administered 2019-05-27: 10 mg via INTRAMUSCULAR

## 2019-05-27 MED ORDER — METOCLOPRAMIDE HCL 5 MG/ML IJ SOLN
5.0000 mg | Freq: Once | INTRAMUSCULAR | Status: AC
  Administered 2019-05-27: 5 mg via INTRAMUSCULAR

## 2019-05-27 MED ORDER — KETOROLAC TROMETHAMINE 60 MG/2ML IM SOLN
INTRAMUSCULAR | Status: AC
  Filled 2019-05-27: qty 2

## 2019-05-27 NOTE — ED Provider Notes (Signed)
[facility]    CSN: [id number] Arrival date & time: 05/27/19  1344      History   Chief Complaint Chief Complaint  Patient presents with  . Migraine    HPI Brittney Watkins is a 38 y.o. female.   Patient is a 38 year old female with past medical history of migraines.  She is presenting with migraine since 5 AM.  Pain to the right side of her head behind her right eye.  Described as throbbing.  Mild photophobia, phonophobia and nausea.  Similar symptoms in the past with her typical migraines.  She took 2 doses of Maxalt without any relief.  She also took Excedrin Migraine without any relief.  No dizziness, blurred vision, facial droop, slurred speech or extremity weakness.  No numbness, tingling.  ROS per HPI      Past Medical History:  Diagnosis Date  . Blurred vision   . Migraine   . UTI (urinary tract infection)     Patient Active Problem List   Diagnosis Date Noted  . Migraine 09/08/2014  . Headache 09/08/2014  . Neck pain 09/08/2014    Past Surgical History:  Procedure Laterality Date  . CHOLECYSTECTOMY  2007  . TUBAL LIGATION  2006    OB History    Gravida  2   Para  2   Term  2   Preterm  0   AB  0   Living  2     SAB  0   TAB  0   Ectopic  0   Multiple  0   Live Births  2            Home Medications    Prior to Admission medications   Medication Sig Start Date End Date Taking? Authorizing Provider  dicyclomine (BENTYL) 20 MG tablet Take 1 tablet (20 mg total) by mouth 4 (four) times daily -  before meals and at bedtime. Patient not taking: Reported on 03/27/2019 03/24/19   Wieters, Hallie C, PA-C  fluticasone (FLONASE) 50 MCG/ACT nasal spray Place 2 sprays into both nostrils daily. Patient not taking: Reported on 03/27/2019 12/23/18   Belinda FisherYu, Amy V, PA-C  Multiple Vitamin (MULTIVITAMIN WITH MINERALS) TABS tablet Take 1 tablet by mouth at bedtime.     [provider]  ondansetron (ZOFRAN ODT) 4 MG disintegrating  tablet Take 1 tablet (4 mg total) by mouth every 8 (eight) hours as needed for nausea or vomiting. Patient not taking: Reported on 03/27/2019 03/24/19   Wieters, Hallie C, PA-C  rizatriptan (MAXALT-MLT) 5 MG disintegrating tablet DIS 1 T ON THE TONGUE QD Patient taking differently: Take 5 mg by mouth as needed for migraine.  09/17/18   Mardella LaymanHagler, Brian, MD    Family History Family History  Problem Relation Age of Onset  . Heart attack Father   . Diabetes Maternal Grandmother        some uncles as well   . High blood pressure Maternal Grandmother        some uncles as well  . Migraines Neg Hx     Social History Social History   Tobacco Use  . Smoking status: Never Smoker  . Smokeless tobacco: Never Used  Substance Use Topics  . Alcohol use: No    Alcohol/week: 0.0 standard drinks  . Drug use: No     Allergies   Nyquil multi-symptom [pseudoeph-doxylamine-dm-apap] and Shellfish allergy   Review of Systems Review of Systems   Physical Exam Triage Vital Signs  ED Triage Vitals  Enc Vitals Group     BP 05/27/19 1434 115/71     Pulse Rate 05/27/19 1434 64     Resp 05/27/19 1434 16     Temp 05/27/19 1434 97.8 F (36.6 C)     Temp Source 05/27/19 1434 Oral     SpO2 05/27/19 1434 100 %     Weight 05/27/19 1433 140 lb (63.5 kg)     Height --      Head Circumference --      Peak Flow --      Pain Score 05/27/19 1433 10     Pain Loc --      Pain Edu? --      Excl. in [place]? --    No data found.  Updated Vital Signs BP 115/71 (BP Location: Right Arm)   Pulse 64   Temp 97.8 F (36.6 C) (Oral)   Resp 16   Wt 140 lb (63.5 kg)   LMP 05/18/2019   SpO2 100%   BMI 25.61 kg/m   Visual Acuity Right Eye Distance:   Left Eye Distance:   Bilateral Distance:    Right Eye Near:   Left Eye Near:    Bilateral Near:     Physical Exam Vitals signs and nursing note reviewed.  Constitutional:      General: She is not in acute distress.    Appearance: Normal appearance. She is  not ill-appearing, toxic-appearing or diaphoretic.  HENT:     Head: Normocephalic and atraumatic.     Nose: Nose normal.     Mouth/Throat:     Pharynx: Oropharynx is clear.  Eyes:     Conjunctiva/sclera: Conjunctivae normal.  Neck:     Musculoskeletal: Normal range of motion.  Pulmonary:     Effort: Pulmonary effort is normal.  Musculoskeletal: Normal range of motion.  Skin:    General: Skin is warm and dry.  Neurological:     General: No focal deficit present.     Mental Status: She is alert.     Cranial Nerves: No cranial nerve deficit.     Sensory: No sensory deficit.     Motor: No weakness.     Coordination: Coordination normal.     Gait: Gait normal.  Psychiatric:        Mood and Affect: Mood normal.        Behavior: Behavior normal.      UC Treatments / Results  Labs (all labs ordered are listed, but only abnormal results are displayed) Labs Reviewed - No data to display  EKG   Radiology No results found.  Procedures Procedures (including critical care time)  Medications Ordered in UC Medications  ketorolac (TORADOL) injection 60 mg (60 mg Intramuscular Given 05/27/19 1522)  metoCLOPramide (REGLAN) injection 5 mg (5 mg Intramuscular Given 05/27/19 1522)  dexamethasone (DECADRON) injection 10 mg (10 mg Intramuscular Given 05/27/19 1523)  dexamethasone (DECADRON) 10 MG/ML injection (has no administration in time range)  ketorolac (TORADOL) 60 MG/2ML injection (has no administration in time range)  metoCLOPramide (REGLAN) 5 MG/ML injection (has no administration in time range)    Initial Impression / Assessment and Plan / UC Course  I have reviewed the triage vital signs and the nursing notes.  Pertinent labs & imaging results that were available during my care of the patient were reviewed by me and considered in my medical decision making (see chart for details).    Migraine-  Treating patient's migraine today in clinic  with migraine cocktail to include  dexamethasone, Reglan and Toradol. No concerning neurological symptoms.  Recommended follow-up with her neurologist for medication management Final Clinical Impressions(s) / UC Diagnoses   Final diagnoses:  Migraine without aura and without status migrainosus, not intractable     Discharge Instructions     Treated you for a migraine here in clinic today.  Make sure you follow-up with your neurologist for further management of your migraine medicines Go home and rest and drink plenty of fluids.    ED Prescriptions    None     Controlled Substance Prescriptions Many Controlled Substance Registry consulted? Not Applicable   Orvan July, NP 05/27/19 1601

## 2019-05-27 NOTE — ED Triage Notes (Signed)
Pt states she is having a migraine this started this morning at around 5 am.

## 2019-05-27 NOTE — Discharge Instructions (Signed)
Treated you for a migraine here in clinic today.  Make sure you follow-up with your neurologist for further management of your migraine medicines Go home and rest and drink plenty of fluids.

## 2019-05-28 ENCOUNTER — Ambulatory Visit: Payer: Medicaid Other | Admitting: Obstetrics & Gynecology

## 2019-06-25 ENCOUNTER — Telehealth: Payer: Self-pay | Admitting: Family Medicine

## 2019-06-25 NOTE — Telephone Encounter (Signed)
Called the patient to confirm the appointment. The patient answered no to the covid19 screening questions. Also advised the patient of no children or visitors due to covid19 restrictions. °

## 2019-06-26 ENCOUNTER — Ambulatory Visit: Payer: Medicaid Other | Admitting: Obstetrics & Gynecology

## 2019-07-19 ENCOUNTER — Ambulatory Visit (HOSPITAL_COMMUNITY)
Admission: EM | Admit: 2019-07-19 | Discharge: 2019-07-19 | Disposition: A | Discharge status: Home / Self Care | Payer: Medicaid Other | Attending: Emergency Medicine | Admitting: Emergency Medicine

## 2019-07-19 ENCOUNTER — Encounter (HOSPITAL_COMMUNITY): Payer: Self-pay | Admitting: Emergency Medicine

## 2019-07-19 DIAGNOSIS — R1031 Right lower quadrant pain: Secondary | ICD-10-CM | POA: Insufficient documentation

## 2019-07-19 DIAGNOSIS — Z3202 Encounter for pregnancy test, result negative: Secondary | ICD-10-CM | POA: Diagnosis not present

## 2019-07-19 DIAGNOSIS — R350 Frequency of micturition: Secondary | ICD-10-CM | POA: Insufficient documentation

## 2019-07-19 LAB — POCT URINALYSIS DIP (DEVICE)
Bilirubin Urine: NEGATIVE
Glucose, UA: NEGATIVE mg/dL
Hgb urine dipstick: NEGATIVE
Ketones, ur: NEGATIVE mg/dL
Leukocytes,Ua: NEGATIVE
Nitrite: NEGATIVE
Protein, ur: NEGATIVE mg/dL
Specific Gravity, Urine: 1.01 (ref 1.005–1.030)
Urobilinogen, UA: 0.2 mg/dL (ref 0.0–1.0)
pH: 6 (ref 5.0–8.0)

## 2019-07-19 LAB — POCT PREGNANCY, URINE: Preg Test, Ur: NEGATIVE

## 2019-07-19 MED ORDER — NAPROXEN 500 MG PO TABS: 500.0000 mg | ORAL_TABLET | Freq: Two times a day (BID) | ORAL | 0 refills | Status: DC

## 2019-07-19 NOTE — Discharge Instructions (Signed)
Please follow up with OBGYN and/or urology if symptoms persisting Use naprosyn as needed for pain

## 2019-07-19 NOTE — ED Triage Notes (Signed)
Pt c/o RLQ abdominal pain, off and on for "a while". Pt also c/o urinating a lot. Pt has tenderness to palpation in the RLQ area.

## 2019-07-19 NOTE — ED Provider Notes (Signed)
[facility]    CSN: [id number] Arrival date & time: 07/19/19  1148      History   Chief Complaint Chief Complaint  Patient presents with   Abdominal Pain    HPI Brittney Watkins is a 38 y.o. female history of migraines presenting today for evaluation of abdominal pain.  Patient states that for the past many months she has had intermittent right lower quadrant abdominal pain.  Her main concern today is that she has had associated urinary frequency.  She notices this worse at nighttime.  Feels the pain becomes more prominent when she needs to urinate/holds her bladder.  States that she will wake up 10-12 times overnight to use the bathroom.  Patient is able to fully urinate most times.  Often will stand up and sit back down and urinate some more.  She denies any dysuria.  Denies any vaginal symptoms of abnormal discharge, itching or irritation.  Has had previous tubal ligation.  Is on oral contraceptives that she is currently being evaluated for endometriosis by OB/GYN.  Has had improvement in cramping sensation during menstrual cycles with oral contraceptives.  She just recently ended her menstrual cycle about 5 days ago.  Her pain restarted 3 days ago.  She denies any new partners.  Has not been regularly sexually active with her husband due to endometriosis and discomfort with intercourse.  She denies any nausea or vomiting.  Denies changes in bowel movements, denies diarrhea or constipation.  Typically has 1-2 bowel movements daily.  HPI  Past Medical History:  Diagnosis Date   Blurred vision    Migraine    UTI (urinary tract infection)     Patient Active Problem List   Diagnosis Date Noted   Migraine 09/08/2014   Headache 09/08/2014   Neck pain 09/08/2014    Past Surgical History:  Procedure Laterality Date   CHOLECYSTECTOMY  2007   TUBAL LIGATION  2006    OB History    Gravida  2   Para  2   Term  2   Preterm  0   AB  0   Living  2     SAB  0   TAB  0   Ectopic  0   Multiple  0   Live Births  2            Home Medications    Prior to Admission medications   Medication Sig Start Date End Date Taking? Authorizing Provider  ferrous sulfate 325 (65 FE) MG EC tablet Take 325 mg by mouth 3 (three) times daily with meals.   Yes [provider]  Multiple Vitamin (MULTIVITAMIN WITH MINERALS) TABS tablet Take 1 tablet by mouth at bedtime.     [provider]  naproxen (NAPROSYN) 500 MG tablet Take 1 tablet (500 mg total) by mouth 2 (two) times daily. 07/19/19   Valton Schwartz C, PA-C  dicyclomine (BENTYL) 20 MG tablet Take 1 tablet (20 mg total) by mouth 4 (four) times daily -  before meals and at bedtime. Patient not taking: Reported on 03/27/2019 03/24/19 07/19/19  Trust Crago C, PA-C  fluticasone (FLONASE) 50 MCG/ACT nasal spray Place 2 sprays into both nostrils daily. Patient not taking: Reported on 03/27/2019 12/23/18 07/19/19  Belinda Fisher, PA-C  rizatriptan (MAXALT-MLT) 5 MG disintegrating tablet DIS 1 T ON THE TONGUE QD Patient taking differently: Take 5 mg by mouth as needed for migraine.  09/17/18 07/19/19  Mardella Layman, MD  Family History Family History  Problem Relation Age of Onset   Heart attack Father    Diabetes Maternal Grandmother        some uncles as well    High blood pressure Maternal Grandmother        some uncles as well   Migraines Neg Hx     Social History Social History   Tobacco Use   Smoking status: Never Smoker   Smokeless tobacco: Never Used  Substance Use Topics   Alcohol use: No    Alcohol/week: 0.0 standard drinks   Drug use: No     Allergies   Nyquil multi-symptom [pseudoeph-doxylamine-dm-apap] and Shellfish allergy   Review of Systems Review of Systems  Constitutional: Negative for fever.  Respiratory: Negative for shortness of breath.   Cardiovascular: Negative for chest pain.  Gastrointestinal: Positive for abdominal pain. Negative  for diarrhea, nausea and vomiting.  Genitourinary: Positive for frequency. Negative for dysuria, flank pain, genital sores, hematuria, menstrual problem, vaginal bleeding, vaginal discharge and vaginal pain.  Musculoskeletal: Negative for back pain.  Skin: Negative for rash.  Neurological: Negative for dizziness, light-headedness and headaches.     Physical Exam Triage Vital Signs ED Triage Vitals  Enc Vitals Group     BP 07/19/19 1242 120/84     Pulse Rate 07/19/19 1242 67     Resp 07/19/19 1242 18     Temp 07/19/19 1242 98.6 F (37 C)     Temp src --      SpO2 07/19/19 1242 97 %     Weight --      Height --      Head Circumference --      Peak Flow --      Pain Score 07/19/19 1243 5     Pain Loc --      Pain Edu? --      Excl. in [place]? --    No data found.  Updated Vital Signs BP 120/84    Pulse 67    Temp 98.6 F (37 C)    Resp 18    LMP 07/13/2019    SpO2 97%   Visual Acuity Right Eye Distance:   Left Eye Distance:   Bilateral Distance:    Right Eye Near:   Left Eye Near:    Bilateral Near:     Physical Exam Vitals signs and nursing note reviewed.  Constitutional:      General: She is not in acute distress.    Appearance: She is well-developed.  HENT:     Head: Normocephalic and atraumatic.  Eyes:     Conjunctiva/sclera: Conjunctivae normal.  Neck:     Musculoskeletal: Neck supple.  Cardiovascular:     Rate and Rhythm: Normal rate and regular rhythm.     Heart sounds: No murmur.  Pulmonary:     Effort: Pulmonary effort is normal. No respiratory distress.     Breath sounds: Normal breath sounds.     Comments: Breathing comfortably at rest, CTABL, no wheezing, rales or other adventitious sounds auscultated Abdominal:     Palpations: Abdomen is soft.     Tenderness: There is abdominal tenderness.     Comments: Tenderness to palpation to deep right lower quadrant and to right groin/inguinal area, negative rebound, negative Rovsing, negative McBurney's    Skin:    General: Skin is warm and dry.  Neurological:     Mental Status: She is alert.      UC Treatments / Results  Labs (  all labs ordered are listed, but only abnormal results are displayed) Labs Reviewed  POC URINE PREG, ED  POCT URINALYSIS DIP (DEVICE)  POCT PREGNANCY, URINE  CERVICOVAGINAL ANCILLARY ONLY    EKG   Radiology No results found.  Procedures Procedures (including critical care time)  Medications Ordered in UC Medications - No data to display  Initial Impression / Assessment and Plan / UC Course  I have reviewed the triage vital signs and the nursing notes.  Pertinent labs & imaging results that were available during my care of the patient were reviewed by me and considered in my medical decision making (see chart for details).     UA completely unremarkable, no glucose, hemoglobin or signs of infection.  Will recheck vaginal swab to rule out any vaginal causes contributing to urinary frequency.  Do not suspect appendicitis at this time as cause of pain.  Continue with plans to follow-up with OB/GYN on November 16.  Discussed possible urology evaluation given urinary frequency if symptoms persist.  Continue to double void, avoid fluid/caffeine 3 to 4 hours before bedtime.  Discussed strict return precautions. Patient verbalized understanding and is agreeable with plan.  Final Clinical Impressions(s) / UC Diagnoses   Final diagnoses:  Right lower quadrant abdominal pain  Urinary frequency     Discharge Instructions     Please follow up with OBGYN and/or urology if symptoms persisting Use naprosyn as needed for pain   ED Prescriptions    Medication Sig Dispense Auth. Provider   naproxen (NAPROSYN) 500 MG tablet Take 1 tablet (500 mg total) by mouth 2 (two) times daily. 30 tablet Maejor Erven, [place] RiverHallie C, PA-C     PDMP not reviewed this encounter.   Lew DawesWieters, Mysha Peeler C, [place]PA-C 07/19/19 1354

## 2019-07-22 LAB — CERVICOVAGINAL ANCILLARY ONLY
Bacterial vaginitis: NEGATIVE
Candida vaginitis: NEGATIVE
Chlamydia: NEGATIVE
Neisseria Gonorrhea: NEGATIVE
Trichomonas: NEGATIVE

## 2019-08-04 ENCOUNTER — Ambulatory Visit: Payer: Medicaid Other | Admitting: Obstetrics & Gynecology

## 2019-08-22 DIAGNOSIS — R7303 Prediabetes: Secondary | ICD-10-CM | POA: Diagnosis not present

## 2019-08-22 DIAGNOSIS — E785 Hyperlipidemia, unspecified: Secondary | ICD-10-CM | POA: Diagnosis not present

## 2019-08-22 DIAGNOSIS — N926 Irregular menstruation, unspecified: Secondary | ICD-10-CM | POA: Diagnosis not present

## 2019-08-22 DIAGNOSIS — G43909 Migraine, unspecified, not intractable, without status migrainosus: Secondary | ICD-10-CM | POA: Diagnosis not present

## 2019-08-22 DIAGNOSIS — E559 Vitamin D deficiency, unspecified: Secondary | ICD-10-CM | POA: Diagnosis not present

## 2019-08-22 DIAGNOSIS — R1013 Epigastric pain: Secondary | ICD-10-CM | POA: Diagnosis not present

## 2019-09-16 DIAGNOSIS — Z20828 Contact with and (suspected) exposure to other viral communicable diseases: Secondary | ICD-10-CM | POA: Diagnosis not present

## 2019-09-26 DIAGNOSIS — R351 Nocturia: Secondary | ICD-10-CM | POA: Diagnosis not present

## 2019-09-26 DIAGNOSIS — R350 Frequency of micturition: Secondary | ICD-10-CM | POA: Diagnosis not present

## 2019-10-13 ENCOUNTER — Telehealth: Payer: Self-pay | Admitting: Family Medicine

## 2019-10-13 NOTE — Telephone Encounter (Signed)
Called the patient to complete the covid screening. The patient stated she will need to reschedule because she will be at work during that time tomorrow. The patient stated she is only available on Friday around 3:30. Informed the patient our office closes at 12pm. The patient then stated Thursday at 1:30 only however we dont have an options for an 1:30 gyn appt.  The patient stated she will give our office an call back when is available.

## 2019-10-14 ENCOUNTER — Telehealth: Payer: Medicaid Other | Admitting: Obstetrics & Gynecology

## 2019-10-22 DIAGNOSIS — N926 Irregular menstruation, unspecified: Secondary | ICD-10-CM | POA: Diagnosis not present

## 2019-10-22 DIAGNOSIS — R1013 Epigastric pain: Secondary | ICD-10-CM | POA: Diagnosis not present

## 2019-10-22 DIAGNOSIS — E559 Vitamin D deficiency, unspecified: Secondary | ICD-10-CM | POA: Diagnosis not present

## 2019-10-22 DIAGNOSIS — G43909 Migraine, unspecified, not intractable, without status migrainosus: Secondary | ICD-10-CM | POA: Diagnosis not present

## 2019-10-22 DIAGNOSIS — R7303 Prediabetes: Secondary | ICD-10-CM | POA: Diagnosis not present

## 2019-10-22 DIAGNOSIS — E785 Hyperlipidemia, unspecified: Secondary | ICD-10-CM | POA: Diagnosis not present

## 2019-11-14 ENCOUNTER — Ambulatory Visit: Payer: Medicaid Other | Attending: Urology | Admitting: Physical Therapy

## 2019-11-20 DIAGNOSIS — N926 Irregular menstruation, unspecified: Secondary | ICD-10-CM | POA: Diagnosis not present

## 2019-11-20 DIAGNOSIS — G43909 Migraine, unspecified, not intractable, without status migrainosus: Secondary | ICD-10-CM | POA: Diagnosis not present

## 2019-11-20 DIAGNOSIS — E785 Hyperlipidemia, unspecified: Secondary | ICD-10-CM | POA: Diagnosis not present

## 2019-11-20 DIAGNOSIS — E559 Vitamin D deficiency, unspecified: Secondary | ICD-10-CM | POA: Diagnosis not present

## 2019-11-20 DIAGNOSIS — R7303 Prediabetes: Secondary | ICD-10-CM | POA: Diagnosis not present

## 2019-11-20 DIAGNOSIS — R1013 Epigastric pain: Secondary | ICD-10-CM | POA: Diagnosis not present

## 2019-11-28 ENCOUNTER — Other Ambulatory Visit: Payer: Self-pay

## 2019-11-28 ENCOUNTER — Encounter (HOSPITAL_COMMUNITY): Payer: Self-pay

## 2019-11-28 ENCOUNTER — Ambulatory Visit (HOSPITAL_COMMUNITY)
Admission: EM | Admit: 2019-11-28 | Discharge: 2019-11-28 | Disposition: A | Discharge status: Home / Self Care | Payer: Medicaid Other | Attending: Family Medicine | Admitting: Family Medicine

## 2019-11-28 DIAGNOSIS — S60222A Contusion of left hand, initial encounter: Secondary | ICD-10-CM

## 2019-11-28 DIAGNOSIS — S6992XA Unspecified injury of left wrist, hand and finger(s), initial encounter: Secondary | ICD-10-CM

## 2019-11-28 DIAGNOSIS — M79642 Pain in left hand: Secondary | ICD-10-CM

## 2019-11-28 MED ORDER — NAPROXEN 500 MG PO TABS: 500.0000 mg | ORAL_TABLET | Freq: Two times a day (BID) | ORAL | 0 refills | Status: DC

## 2019-11-28 NOTE — Discharge Instructions (Signed)
Ice over the area that hurts 20 minutes every 2 hours for the first 24-48 hours. Use Tylenol at a dose of 500mg -650mg  once every 6 hours as needed for aches and pains. Naproxen can be used as well but will address pains and inflammation which can happen with a hand contusion.

## 2019-11-28 NOTE — ED Triage Notes (Signed)
Pt presents with left hand injury after falling this morning and landing on it.

## 2019-11-28 NOTE — ED Provider Notes (Signed)
[facility]   MRN: [id number] DOB: [birth year]  Subjective:   Brittney Watkins is a 39 y.o. female presenting for left hand pain and stiffness since this morning.  Patient reports that the symptoms are very mild right now.  Symptoms started after she fell accidentally while walking her dog.  She broke her fall on an outstretched left hand, fell against pavement.  Denies any injury, loss of consciousness, headache, confusion.  Denies hand swelling.  She decided to come in after her employer sent her home, stated that it looked like her hand was stiff.  Patient did take Tylenol which has helped.  Denies any bruising, laceration, loss of sensation.  No current facility-administered medications for this encounter.  Current Outpatient Medications:  .  ferrous sulfate 325 (65 FE) MG EC tablet, Take 325 mg by mouth 3 (three) times daily with meals., Disp: , Rfl:  .  Multiple Vitamin (MULTIVITAMIN WITH MINERALS) TABS tablet, Take 1 tablet by mouth at bedtime. , Disp: , Rfl:  .  naproxen (NAPROSYN) 500 MG tablet, Take 1 tablet (500 mg total) by mouth 2 (two) times daily., Disp: 30 tablet, Rfl: 0   Allergies  Allergen Reactions  . Nyquil Multi-Symptom [Pseudoeph-Doxylamine-Dm-Apap] Swelling and Rash  . Shellfish Allergy Rash    Past Medical History:  Diagnosis Date  . Blurred vision   . Migraine   . UTI (urinary tract infection)      Past Surgical History:  Procedure Laterality Date  . CHOLECYSTECTOMY  2007  . TUBAL LIGATION  2006    Family History  Problem Relation Age of Onset  . Heart attack Father   . Diabetes Maternal Grandmother        some uncles as well   . High blood pressure Maternal Grandmother        some uncles as well  . Migraines Neg Hx     Social History   Tobacco Use  . Smoking status: Never Smoker  . Smokeless tobacco: Never Used  Substance Use Topics  . Alcohol use: No    Alcohol/week: 0.0 standard drinks  . Drug use: No    ROS   Objective:    Vitals: BP 113/76 (BP Location: Left Arm)   Pulse 85   Temp 98.6 F (37 C) (Oral)   Resp 18   LMP 11/28/2019   SpO2 98%   Physical Exam Constitutional:      General: She is not in acute distress.    Appearance: Normal appearance. She is well-developed. She is not ill-appearing, toxic-appearing or diaphoretic.  HENT:     Head: Normocephalic and atraumatic.     Nose: Nose normal.     Mouth/Throat:     Mouth: Mucous membranes are moist.     Pharynx: Oropharynx is clear.  Eyes:     General: No scleral icterus.    Extraocular Movements: Extraocular movements intact.     Pupils: Pupils are equal, round, and reactive to light.  Cardiovascular:     Rate and Rhythm: Normal rate.  Pulmonary:     Effort: Pulmonary effort is normal.  Musculoskeletal:     Left hand: Tenderness (minimal over palmar surface of left hand without abrasion, laceration or ecchymosis) present. No swelling, deformity, lacerations or bony tenderness. Normal range of motion. Normal strength. Normal sensation. There is no disruption of two-point discrimination. Normal capillary refill. Normal pulse.  Skin:    General: Skin is warm and dry.  Neurological:     General: No focal  deficit present.     Mental Status: She is alert and oriented to person, place, and time.  Psychiatric:        Mood and Affect: Mood normal.        Behavior: Behavior normal.        Thought Content: Thought content normal.        Judgment: Judgment normal.      Assessment and Plan :   1. Injury of left hand, initial encounter   2. Contusion of left hand, initial encounter   3. Left hand pain     Will manage conservatively with rest, ice, naproxen for pain. Counseled patient on potential for adverse effects with medications prescribed/recommended today, ER and return-to-clinic precautions discussed, patient verbalized understanding.    Wallis Bamberg, [place] 11/28/19 1614

## 2019-12-26 DIAGNOSIS — N926 Irregular menstruation, unspecified: Secondary | ICD-10-CM | POA: Diagnosis not present

## 2019-12-26 DIAGNOSIS — E785 Hyperlipidemia, unspecified: Secondary | ICD-10-CM | POA: Diagnosis not present

## 2019-12-26 DIAGNOSIS — R7303 Prediabetes: Secondary | ICD-10-CM | POA: Diagnosis not present

## 2019-12-26 DIAGNOSIS — G43909 Migraine, unspecified, not intractable, without status migrainosus: Secondary | ICD-10-CM | POA: Diagnosis not present

## 2019-12-26 DIAGNOSIS — E559 Vitamin D deficiency, unspecified: Secondary | ICD-10-CM | POA: Diagnosis not present

## 2019-12-26 DIAGNOSIS — R1013 Epigastric pain: Secondary | ICD-10-CM | POA: Diagnosis not present

## 2020-01-01 DIAGNOSIS — R1013 Epigastric pain: Secondary | ICD-10-CM | POA: Diagnosis not present

## 2020-01-01 DIAGNOSIS — N926 Irregular menstruation, unspecified: Secondary | ICD-10-CM | POA: Diagnosis not present

## 2020-01-01 DIAGNOSIS — R7303 Prediabetes: Secondary | ICD-10-CM | POA: Diagnosis not present

## 2020-01-01 DIAGNOSIS — E785 Hyperlipidemia, unspecified: Secondary | ICD-10-CM | POA: Diagnosis not present

## 2020-01-01 DIAGNOSIS — E559 Vitamin D deficiency, unspecified: Secondary | ICD-10-CM | POA: Diagnosis not present

## 2020-01-01 DIAGNOSIS — J0110 Acute frontal sinusitis, unspecified: Secondary | ICD-10-CM | POA: Diagnosis not present

## 2020-01-01 DIAGNOSIS — G43909 Migraine, unspecified, not intractable, without status migrainosus: Secondary | ICD-10-CM | POA: Diagnosis not present

## 2020-01-29 DIAGNOSIS — G43909 Migraine, unspecified, not intractable, without status migrainosus: Secondary | ICD-10-CM | POA: Diagnosis not present

## 2020-01-29 DIAGNOSIS — R7303 Prediabetes: Secondary | ICD-10-CM | POA: Diagnosis not present

## 2020-01-29 DIAGNOSIS — E559 Vitamin D deficiency, unspecified: Secondary | ICD-10-CM | POA: Diagnosis not present

## 2020-01-29 DIAGNOSIS — N946 Dysmenorrhea, unspecified: Secondary | ICD-10-CM | POA: Diagnosis not present

## 2020-01-29 DIAGNOSIS — R1013 Epigastric pain: Secondary | ICD-10-CM | POA: Diagnosis not present

## 2020-01-29 DIAGNOSIS — N926 Irregular menstruation, unspecified: Secondary | ICD-10-CM | POA: Diagnosis not present

## 2020-01-29 DIAGNOSIS — E785 Hyperlipidemia, unspecified: Secondary | ICD-10-CM | POA: Diagnosis not present

## 2020-01-30 DIAGNOSIS — Z20822 Contact with and (suspected) exposure to covid-19: Secondary | ICD-10-CM | POA: Diagnosis not present

## 2020-02-10 DIAGNOSIS — N926 Irregular menstruation, unspecified: Secondary | ICD-10-CM | POA: Diagnosis not present

## 2020-02-10 DIAGNOSIS — E559 Vitamin D deficiency, unspecified: Secondary | ICD-10-CM | POA: Diagnosis not present

## 2020-02-10 DIAGNOSIS — R7303 Prediabetes: Secondary | ICD-10-CM | POA: Diagnosis not present

## 2020-02-10 DIAGNOSIS — R109 Unspecified abdominal pain: Secondary | ICD-10-CM | POA: Diagnosis not present

## 2020-02-10 DIAGNOSIS — N946 Dysmenorrhea, unspecified: Secondary | ICD-10-CM | POA: Diagnosis not present

## 2020-02-10 DIAGNOSIS — G43909 Migraine, unspecified, not intractable, without status migrainosus: Secondary | ICD-10-CM | POA: Diagnosis not present

## 2020-02-10 DIAGNOSIS — R1013 Epigastric pain: Secondary | ICD-10-CM | POA: Diagnosis not present

## 2020-02-10 DIAGNOSIS — E785 Hyperlipidemia, unspecified: Secondary | ICD-10-CM | POA: Diagnosis not present

## 2020-02-13 ENCOUNTER — Ambulatory Visit (HOSPITAL_COMMUNITY)
Admission: EM | Admit: 2020-02-13 | Discharge: 2020-02-13 | Disposition: A | Discharge status: Home / Self Care | Payer: Medicaid Other | Attending: Emergency Medicine | Admitting: Emergency Medicine

## 2020-02-13 ENCOUNTER — Ambulatory Visit (INDEPENDENT_AMBULATORY_CARE_PROVIDER_SITE_OTHER): Payer: Medicaid Other

## 2020-02-13 ENCOUNTER — Encounter (HOSPITAL_COMMUNITY): Payer: Self-pay

## 2020-02-13 ENCOUNTER — Other Ambulatory Visit: Payer: Self-pay

## 2020-02-13 DIAGNOSIS — Z3202 Encounter for pregnancy test, result negative: Secondary | ICD-10-CM

## 2020-02-13 DIAGNOSIS — R109 Unspecified abdominal pain: Secondary | ICD-10-CM | POA: Diagnosis not present

## 2020-02-13 DIAGNOSIS — R509 Fever, unspecified: Secondary | ICD-10-CM

## 2020-02-13 DIAGNOSIS — R1030 Lower abdominal pain, unspecified: Secondary | ICD-10-CM | POA: Diagnosis not present

## 2020-02-13 DIAGNOSIS — K5900 Constipation, unspecified: Secondary | ICD-10-CM | POA: Diagnosis not present

## 2020-02-13 LAB — POC URINE PREG, ED: Preg Test, Ur: NEGATIVE

## 2020-02-13 LAB — POCT URINALYSIS DIP (DEVICE)
Glucose, UA: 100 mg/dL — AB
Hgb urine dipstick: NEGATIVE
Ketones, ur: 15 mg/dL — AB
Nitrite: POSITIVE — AB
Protein, ur: 30 mg/dL — AB
Specific Gravity, Urine: 1.025 (ref 1.005–1.030)
Urobilinogen, UA: 2 mg/dL — ABNORMAL HIGH (ref 0.0–1.0)
pH: 5 (ref 5.0–8.0)

## 2020-02-13 MED ORDER — POLYETHYLENE GLYCOL 3350 17 G PO PACK: 17.0000 g | PACK | Freq: Every day | ORAL | 0 refills | Status: AC

## 2020-02-13 MED ORDER — DOCUSATE SODIUM 100 MG PO CAPS: 100.0000 mg | ORAL_CAPSULE | Freq: Two times a day (BID) | ORAL | 0 refills | Status: DC

## 2020-02-13 MED ORDER — ONDANSETRON 4 MG PO TBDP: 4.0000 mg | ORAL_TABLET | Freq: Three times a day (TID) | ORAL | 0 refills | Status: DC | PRN

## 2020-02-13 MED ORDER — NAPROXEN 500 MG PO TABS: 500.0000 mg | ORAL_TABLET | Freq: Two times a day (BID) | ORAL | 0 refills | Status: DC | PRN

## 2020-02-13 NOTE — ED Triage Notes (Signed)
Pt c/o lower abdom pain for approx 1 month along with nausea. Pt reports lower back/left flank pain last several days along with low grade fever with Tmax 100. Pt states she visited PMD for symptoms and Dr. tx her for possible renal stone and gave Rx: Cipro, tamsulosin, pyridium. Pt states that she has been forcing fluid with approx 100 ounces H2O.  Here today for continued back/flank pain, chills.  Denies n/v/d at present. Pt was not given a urinary filter for stone collection.

## 2020-02-13 NOTE — Discharge Instructions (Signed)
Urine culture pending Zofran for nausea Naprosyn twice daily as needed for pain  Please use Miralax for moderate to severe constipation. Take this once a day for the next 2-3 days. Please also start docusate stool softener, twice a day for at least 1 week. If stools become loose, cut down to once a day for another week. If stools remain loose, cut back to 1 pill every other day for a third week. You can stop docusate thereafter and resume as needed for constipation.  To help reduce constipation and promote bowel health: 1. Drink at least 64 ounces of water each day 2. Eat plenty of fiber (fruits, vegetables, whole grains, legumes) 3. Be physically active or exercise including walking, jogging, swimming, yoga, etc. 4. For active constipation use a stool softener (docusate) or an osmotic laxative (like Miralax) each day, or as needed.

## 2020-02-14 NOTE — ED Provider Notes (Signed)
[facility]    CSN: [id number] Arrival date & time: 02/13/20  1601      History   Chief Complaint Chief Complaint  Patient presents with  . Abdominal Pain  . Fever    HPI Brittney Watkins is a 39 y.o. female history of prior cholecystectomy and tubal ligation presenting today for evaluation of flank and abdominal pain.  Patient reports that over the past month she has had some left flank pain and lower back pain as well as lower abdominal pain.  In the past  week her symptoms have intensified and reports sharp pains from her left flank into her lower abdomen.  She was seen by her primary care earlier in the week.  She was evaluated by her primary care and was noted to have negative UA, but still was treated for UTI and stone.  She was initiated on Cipro twice daily x3 days, Flomax and Pyridium.  She has been trying to drink plenty of fluids.  Denies prior kidney stones.  Denies blood in urine.  She presents today as her symptoms intensified in the past 24 hours.  She has felt slightly nauseous.  Denies vomiting.  Denies fevers.  Denies any abnormal discharge or concern for STDs.  Bowel movements have been normal-reports 2-3 bowel movements a day.  Does report slight worsening of pain with eating.   HPI  Past Medical History:  Diagnosis Date  . Blurred vision   . Migraine   . UTI (urinary tract infection)     Patient Active Problem List   Diagnosis Date Noted  . Migraine 09/08/2014  . Headache 09/08/2014  . Neck pain 09/08/2014    Past Surgical History:  Procedure Laterality Date  . CHOLECYSTECTOMY  2007  . TUBAL LIGATION  2006    OB History    Gravida  2   Para  2   Term  2   Preterm  0   AB  0   Living  2     SAB  0   TAB  0   Ectopic  0   Multiple  0   Live Births  2            Home Medications    Prior to Admission medications   Medication Sig Start Date End Date Taking? Authorizing Provider  ciprofloxacin (CIPRO) 500 MG  tablet SMARTSIG:1 Tablet(s) By Mouth Every 12 Hours 02/10/20  Yes [provider]  phenazopyridine (PYRIDIUM) 200 MG tablet TAKE 1 TABLET BY MOUTH THREE TIMES DAILY AFTER MEALS FOR 2 DAYS 02/10/20  Yes [provider]  tamsulosin (FLOMAX) 0.4 MG CAPS capsule Take 0.4 mg by mouth daily. 02/10/20  Yes [provider]  docusate sodium (COLACE) 100 MG capsule Take 1 capsule (100 mg total) by mouth 2 (two) times daily. 02/13/20   Chadrick Sprinkle C, PA-C  ferrous sulfate 325 (65 FE) MG EC tablet Take 325 mg by mouth 3 (three) times daily with meals.    [provider]  Multiple Vitamin (MULTIVITAMIN WITH MINERALS) TABS tablet Take 1 tablet by mouth at bedtime.     [provider]  naproxen (NAPROSYN) 500 MG tablet Take 1 tablet (500 mg total) by mouth 2 (two) times daily as needed (pain). 02/13/20   Adina Puzzo C, PA-C  ondansetron (ZOFRAN ODT) 4 MG disintegrating tablet Take 1 tablet (4 mg total) by mouth every 8 (eight) hours as needed for nausea or vomiting. 02/13/20   Noella Kipnis, Elesa Hacker,  PA-C  polyethylene glycol (MIRALAX / GLYCOLAX) 17 g packet Take 17 g by mouth daily for 7 days. 02/13/20 02/20/20  Jude Linck C, PA-C  SUMAtriptan (IMITREX) 25 MG tablet Take 25 mg by mouth once. 01/28/20   [provider]  dicyclomine (BENTYL) 20 MG tablet Take 1 tablet (20 mg total) by mouth 4 (four) times daily -  before meals and at bedtime. Patient not taking: Reported on 03/27/2019 03/24/19 07/19/19  Averlee Swartz C, PA-C  fluticasone (FLONASE) 50 MCG/ACT nasal spray Place 2 sprays into both nostrils daily. Patient not taking: Reported on 03/27/2019 12/23/18 07/19/19  Belinda Fisher, PA-C  rizatriptan (MAXALT-MLT) 5 MG disintegrating tablet DIS 1 T ON THE TONGUE QD Patient taking differently: Take 5 mg by mouth as needed for migraine.  09/17/18 07/19/19  Mardella Layman, MD    Family History Family History  Problem Relation Age of Onset  . Heart attack Father   .  Diabetes Maternal Grandmother        some uncles as well   . High blood pressure Maternal Grandmother        some uncles as well  . Migraines Neg Hx     Social History Social History   Tobacco Use  . Smoking status: Never Smoker  . Smokeless tobacco: Never Used  Substance Use Topics  . Alcohol use: No    Alcohol/week: 0.0 standard drinks  . Drug use: No     Allergies   Nyquil multi-symptom [pseudoeph-doxylamine-dm-apap] and Shellfish allergy   Review of Systems Review of Systems  Constitutional: Negative for activity change, appetite change, chills, fatigue and fever.  HENT: Negative for congestion, ear pain, rhinorrhea, sinus pressure, sore throat and trouble swallowing.   Eyes: Negative for discharge and redness.  Respiratory: Negative for cough, chest tightness and shortness of breath.   Cardiovascular: Negative for chest pain.  Gastrointestinal: Positive for abdominal pain and nausea. Negative for diarrhea and vomiting.  Genitourinary: Positive for flank pain, frequency and urgency. Negative for difficulty urinating, dysuria and hematuria.  Musculoskeletal: Negative for myalgias.  Skin: Negative for rash.  Neurological: Negative for dizziness, light-headedness and headaches.     Physical Exam Triage Vital Signs ED Triage Vitals  Enc Vitals Group     BP 02/13/20 1655 105/69     Pulse Rate 02/13/20 1655 86     Resp 02/13/20 1655 16     Temp 02/13/20 1655 98.9 F (37.2 C)     Temp Source 02/13/20 1655 Oral     SpO2 02/13/20 1655 100 %     Weight --      Height --      Head Circumference --      Peak Flow --      Pain Score 02/13/20 1651 8     Pain Loc --      Pain Edu? --      Excl. in GC? --    No data found.  Updated Vital Signs BP 105/69 (BP Location: Left Arm)   Pulse 86   Temp 98.9 F (37.2 C) (Oral)   Resp 16   LMP 01/28/2020   SpO2 100%   Visual Acuity Right Eye Distance:   Left Eye Distance:   Bilateral Distance:    Right Eye Near:     Left Eye Near:    Bilateral Near:     Physical Exam Vitals and nursing note reviewed.  Constitutional:      Appearance: She is well-developed.  Comments: No acute distress  HENT:     Head: Normocephalic and atraumatic.     Nose: Nose normal.  Eyes:     Conjunctiva/sclera: Conjunctivae normal.  Cardiovascular:     Rate and Rhythm: Normal rate.  Pulmonary:     Effort: Pulmonary effort is normal. No respiratory distress.     Comments: Breathing comfortably at rest, CTABL, no wheezing, rales or other adventitious sounds auscultated Abdominal:     General: There is no distension.     Comments: Soft, nondistended, mild tenderness to palpation throughout lower abdomen and suprapubic area  Musculoskeletal:        General: Normal range of motion.     Cervical back: Neck supple.     Comments: No CVA tenderness, no reproducible tenderness to palpation of bilateral flank and thoracic/lumbar regions of back  Skin:    General: Skin is warm and dry.  Neurological:     Mental Status: She is alert and oriented to person, place, and time.      UC Treatments / Results  Labs (all labs ordered are listed, but only abnormal results are displayed) Labs Reviewed  POCT URINALYSIS DIP (DEVICE) - Abnormal; Notable for the following components:      Result Value   Glucose, UA 100 (*)    Bilirubin Urine SMALL (*)    Ketones, ur 15 (*)    Protein, ur 30 (*)    Urobilinogen, UA 2.0 (*)    Nitrite POSITIVE (*)    Leukocytes,Ua LARGE (*)    All other components within normal limits  URINE CULTURE  POC URINE PREG, ED  CERVICOVAGINAL ANCILLARY ONLY    EKG   Radiology DG Abd 1 View  Result Date: 02/13/2020 CLINICAL DATA:  39 year old female with abdominal pain. EXAM: ABDOMEN - 1 VIEW COMPARISON:  None. FINDINGS: There is large amount of stool throughout the colon. No bowel dilatation or evidence of obstruction. No free air or radiopaque calculi. 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 No acute  osseous pathology. IMPRESSION: Constipation. No bowel obstruction. Electronically Signed   By: Elgie Collard M.D.   On: 02/13/2020 19:11    Procedures Procedures (including critical care time)  Medications Ordered in UC Medications - No data to display  Initial Impression / Assessment and Plan / UC Course  I have reviewed the triage vital signs and the nursing notes.  Pertinent labs & imaging results that were available during my care of the patient were reviewed by me and considered in my medical decision making (see chart for details).     UA with positive nitrite, large leuks, negative hemoglobin, patient has been on Pyridium, UA dipstick likely not reliable given change in color.  Sending for culture.  X-ray not suggestive of radiopaque stone.  Does show some constipation and stool throughout colon.  At this time will have patient continue medicines provided earlier in the week as cannot fully rule out UTI at this point, will defer altering antibiotic therapy until culture results obtained.  Continue to drink plenty of fluids.  Given x-ray suggestive of constipation also recommended initiating on MiraLAX/Colace along with lifestyle modifications as this could be explanatory of symptoms as well.  Advised if symptoms and pain progressing or worsening to follow-up in emergency room for possible CT to further evaluate for any possible underlying stone.  Discussed strict return precautions. Patient verbalized understanding and is agreeable with plan.  Final Clinical Impressions(s) / UC Diagnoses   Final diagnoses:  Left flank pain  Lower abdominal pain     Discharge Instructions     Urine culture pending Zofran for nausea Naprosyn twice daily as needed for pain  Please use Miralax for moderate to severe constipation. Take this once a day for the next 2-3 days. Please also start docusate stool softener, twice a day for at least 1 week. If stools become loose, cut down to once a  day for another week. If stools remain loose, cut back to 1 pill every other day for a third week. You can stop docusate thereafter and resume as needed for constipation.  To help reduce constipation and promote bowel health: 1. Drink at least 64 ounces of water each day 2. Eat plenty of fiber (fruits, vegetables, whole grains, legumes) 3. Be physically active or exercise including walking, jogging, swimming, yoga, etc. 4. For active constipation use a stool softener (docusate) or an osmotic laxative (like Miralax) each day, or as needed.   ED Prescriptions    Medication Sig Dispense Auth. Provider   polyethylene glycol (MIRALAX / GLYCOLAX) 17 g packet Take 17 g by mouth daily for 7 days. 7 packet Sky Borboa C, PA-C   docusate sodium (COLACE) 100 MG capsule Take 1 capsule (100 mg total) by mouth 2 (two) times daily. 20 capsule Smriti Barkow C, PA-C   naproxen (NAPROSYN) 500 MG tablet Take 1 tablet (500 mg total) by mouth 2 (two) times daily as needed (pain). 30 tablet Donell Sliwinski C, PA-C   ondansetron (ZOFRAN ODT) 4 MG disintegrating tablet Take 1 tablet (4 mg total) by mouth every 8 (eight) hours as needed for nausea or vomiting. 20 tablet Dorenda Pfannenstiel, Throckmorton C, PA-C     PDMP not reviewed this encounter.   Wilberta Dorvil, Jessie C, PA-C 02/14/20 1254

## 2020-02-15 LAB — URINE CULTURE: Culture: NO GROWTH

## 2020-02-18 LAB — CERVICOVAGINAL ANCILLARY ONLY
Bacterial Vaginitis (gardnerella): NEGATIVE
Candida Glabrata: NEGATIVE
Candida Vaginitis: NEGATIVE
Chlamydia: NEGATIVE
Comment: NEGATIVE
Comment: NEGATIVE
Comment: NEGATIVE
Comment: NEGATIVE
Comment: NEGATIVE
Comment: NORMAL
Neisseria Gonorrhea: NEGATIVE
Trichomonas: NEGATIVE

## 2020-02-19 DIAGNOSIS — E559 Vitamin D deficiency, unspecified: Secondary | ICD-10-CM | POA: Diagnosis not present

## 2020-02-19 DIAGNOSIS — G43909 Migraine, unspecified, not intractable, without status migrainosus: Secondary | ICD-10-CM | POA: Diagnosis not present

## 2020-02-19 DIAGNOSIS — E785 Hyperlipidemia, unspecified: Secondary | ICD-10-CM | POA: Diagnosis not present

## 2020-02-19 DIAGNOSIS — R7303 Prediabetes: Secondary | ICD-10-CM | POA: Diagnosis not present

## 2020-02-19 DIAGNOSIS — N926 Irregular menstruation, unspecified: Secondary | ICD-10-CM | POA: Diagnosis not present

## 2020-02-19 DIAGNOSIS — R1013 Epigastric pain: Secondary | ICD-10-CM | POA: Diagnosis not present

## 2020-02-19 DIAGNOSIS — N946 Dysmenorrhea, unspecified: Secondary | ICD-10-CM | POA: Diagnosis not present

## 2020-03-02 DIAGNOSIS — N946 Dysmenorrhea, unspecified: Secondary | ICD-10-CM | POA: Diagnosis not present

## 2020-03-02 DIAGNOSIS — G43909 Migraine, unspecified, not intractable, without status migrainosus: Secondary | ICD-10-CM | POA: Diagnosis not present

## 2020-03-02 DIAGNOSIS — N926 Irregular menstruation, unspecified: Secondary | ICD-10-CM | POA: Diagnosis not present

## 2020-03-02 DIAGNOSIS — E785 Hyperlipidemia, unspecified: Secondary | ICD-10-CM | POA: Diagnosis not present

## 2020-03-02 DIAGNOSIS — E559 Vitamin D deficiency, unspecified: Secondary | ICD-10-CM | POA: Diagnosis not present

## 2020-03-02 DIAGNOSIS — R1013 Epigastric pain: Secondary | ICD-10-CM | POA: Diagnosis not present

## 2020-03-02 DIAGNOSIS — R7303 Prediabetes: Secondary | ICD-10-CM | POA: Diagnosis not present

## 2020-03-03 DIAGNOSIS — Z1152 Encounter for screening for COVID-19: Secondary | ICD-10-CM | POA: Diagnosis not present

## 2020-03-08 DIAGNOSIS — B001 Herpesviral vesicular dermatitis: Secondary | ICD-10-CM | POA: Diagnosis not present

## 2020-04-09 ENCOUNTER — Encounter: Payer: Medicaid Other | Admitting: Obstetrics & Gynecology

## 2020-04-09 DIAGNOSIS — G43719 Chronic migraine without aura, intractable, without status migrainosus: Secondary | ICD-10-CM | POA: Diagnosis not present

## 2020-04-28 ENCOUNTER — Encounter: Payer: Medicaid Other | Admitting: Obstetrics and Gynecology

## 2020-05-10 ENCOUNTER — Encounter: Payer: Self-pay | Admitting: *Deleted

## 2020-06-02 ENCOUNTER — Other Ambulatory Visit: Payer: Self-pay

## 2020-06-02 ENCOUNTER — Encounter: Payer: Self-pay | Admitting: Obstetrics and Gynecology

## 2020-06-02 ENCOUNTER — Ambulatory Visit (INDEPENDENT_AMBULATORY_CARE_PROVIDER_SITE_OTHER): Payer: Medicaid Other | Admitting: Obstetrics and Gynecology

## 2020-06-02 DIAGNOSIS — N945 Secondary dysmenorrhea: Secondary | ICD-10-CM

## 2020-06-02 NOTE — Progress Notes (Signed)
   Subjective:    Patient ID: Brittney Watkins is a 39 y.o. female presenting with Dysmenorrhea  on 06/02/2020  HPI: 39 yo G2P2, SVD x 2 seen at [facility] clinic.  Her LMP was 8/29-9/1.  Pt notes history of right lower quadrant pain with her menses.  She states she has had pain like this since her tubal ligation approximately 14 years ago.  The tubal ligation was performed 1 year after her last delivery laparoscopically.  She has tired OCP's which have worked and are currently working to decrease the discomfort, but she would prefer a "natural" solution.  Pt denies any pain before the tubal.  She denies dyspareunia and generally does well outside of her menses.  Pt declined both the flu and covid vaccines.  Per pt, her last pap was 6 months ago and was normal.  Review of Systems  Constitutional: Negative.   HENT: Negative.   Eyes: Negative.   Respiratory: Negative.   Cardiovascular: Negative.   Gastrointestinal: Negative.   Genitourinary: Positive for menstrual problem. Negative for dyspareunia.  Musculoskeletal: Negative.   Neurological: Negative.   Psychiatric/Behavioral: Negative.       Objective:    BP 110/74   Pulse 75   Wt 152 lb 12.8 oz (69.3 kg)   LMP 05/16/2020   BMI 27.95 kg/m  Physical Exam      Assessment & Plan:   Secondary dysmenorrhea: Advised continuing OCPs since they have been effective, +/- round the clock ibuprofen at the beginning of menses.  Pt declines the ibuprofen option. At this point would defer ultrasound or laparoscopy since the OCP is effective. I do not believe the pt has symptoms supportive for endometriosis.  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  Total face-to-face time with patient: 15 minutes. Over 50% of encounter was spent on counseling and coordination of care.    F/u prn No follow-ups on file.  Warden Fillers 06/02/2020 9:59 AM

## 2020-06-02 NOTE — Patient Instructions (Signed)
Dysmenorrhea Dysmenorrhea means painful cramps during your period (menstrual period). You will have pain in your lower belly (abdomen). The pain is caused by the tightening (contracting) of the muscles of the womb (uterus). The pain may be mild or very bad. With this condition, you may:  Have a headache.  Feel sick to your stomach (nauseous).  Throw up (vomit).  Have lower back pain. Follow these instructions at home: Helping pain and cramping   Put heat on your lower back or belly when you have pain or cramps. Use the heat source that your doctor tells you to use. ? Place a towel between your skin and the heat. ? Leave the heat on for 20-30 minutes. ? Remove the heat if your skin turns bright red. This is especially important if you cannot feel pain, heat, or cold. ? Do not have a heating pad on during sleep.  Do aerobic exercises. These include walking, swimming, or biking. These may help with cramps.  Massage your lower back or belly. This may help lessen pain. General instructions  Take over-the-counter and prescription medicines only as told by your doctor.  Do not drive or use heavy machinery while taking prescription pain medicine.  Avoid alcohol and caffeine during and right before your period. These can make cramps worse.  Do not use any products that have nicotine or tobacco. These include cigarettes and e-cigarettes. If you need help quitting, ask your doctor.  Keep all follow-up visits as told by your doctor. This is important. Contact a doctor if:  You have pain that gets worse.  You have pain that does not get better with medicine.  You have pain during sex.  You feel sick to your stomach or you throw up during your period, and medicine does not help. Get help right away if:  You pass out (faint). Summary  Dysmenorrhea means painful cramps during your period (menstrual period).  Put heat on your lower back or belly when you have pain or cramps.  Do  exercises like walking, swimming, or biking to help with cramps.  Contact a doctor if you have pain during sex. This information is not intended to replace advice given to you by your health care provider. Make sure you discuss any questions you have with your health care provider. Document Revised: 08/17/2017 Document Reviewed: 09/21/2016 Elsevier Patient Education  2020 [employer]  

## 2020-06-02 NOTE — Progress Notes (Signed)
Pain and cramping on right lower side during menstrual cycles. Having pelvic pain. states had cyst 10 yrs ago.

## 2020-06-17 DIAGNOSIS — N946 Dysmenorrhea, unspecified: Secondary | ICD-10-CM | POA: Diagnosis not present

## 2020-06-17 DIAGNOSIS — R1013 Epigastric pain: Secondary | ICD-10-CM | POA: Diagnosis not present

## 2020-06-17 DIAGNOSIS — N926 Irregular menstruation, unspecified: Secondary | ICD-10-CM | POA: Diagnosis not present

## 2020-06-17 DIAGNOSIS — R7303 Prediabetes: Secondary | ICD-10-CM | POA: Diagnosis not present

## 2020-06-17 DIAGNOSIS — E785 Hyperlipidemia, unspecified: Secondary | ICD-10-CM | POA: Diagnosis not present

## 2020-06-17 DIAGNOSIS — G43909 Migraine, unspecified, not intractable, without status migrainosus: Secondary | ICD-10-CM | POA: Diagnosis not present

## 2020-06-17 DIAGNOSIS — E559 Vitamin D deficiency, unspecified: Secondary | ICD-10-CM | POA: Diagnosis not present

## 2020-06-30 DIAGNOSIS — R7303 Prediabetes: Secondary | ICD-10-CM | POA: Diagnosis not present

## 2020-06-30 DIAGNOSIS — N926 Irregular menstruation, unspecified: Secondary | ICD-10-CM | POA: Diagnosis not present

## 2020-06-30 DIAGNOSIS — E559 Vitamin D deficiency, unspecified: Secondary | ICD-10-CM | POA: Diagnosis not present

## 2020-06-30 DIAGNOSIS — E785 Hyperlipidemia, unspecified: Secondary | ICD-10-CM | POA: Diagnosis not present

## 2020-06-30 DIAGNOSIS — G43909 Migraine, unspecified, not intractable, without status migrainosus: Secondary | ICD-10-CM | POA: Diagnosis not present

## 2020-06-30 DIAGNOSIS — N946 Dysmenorrhea, unspecified: Secondary | ICD-10-CM | POA: Diagnosis not present

## 2020-06-30 DIAGNOSIS — R1013 Epigastric pain: Secondary | ICD-10-CM | POA: Diagnosis not present

## 2020-08-27 ENCOUNTER — Ambulatory Visit (HOSPITAL_COMMUNITY)
Admission: EM | Admit: 2020-08-27 | Discharge: 2020-08-27 | Disposition: A | Discharge status: Home / Self Care | Payer: Medicaid Other | Attending: Emergency Medicine | Admitting: Emergency Medicine

## 2020-08-27 ENCOUNTER — Encounter (HOSPITAL_COMMUNITY): Payer: Self-pay

## 2020-08-27 ENCOUNTER — Other Ambulatory Visit: Payer: Self-pay

## 2020-08-27 DIAGNOSIS — N926 Irregular menstruation, unspecified: Secondary | ICD-10-CM | POA: Diagnosis not present

## 2020-08-27 DIAGNOSIS — E785 Hyperlipidemia, unspecified: Secondary | ICD-10-CM | POA: Diagnosis not present

## 2020-08-27 DIAGNOSIS — E559 Vitamin D deficiency, unspecified: Secondary | ICD-10-CM | POA: Diagnosis not present

## 2020-08-27 DIAGNOSIS — G43909 Migraine, unspecified, not intractable, without status migrainosus: Secondary | ICD-10-CM

## 2020-08-27 DIAGNOSIS — N946 Dysmenorrhea, unspecified: Secondary | ICD-10-CM | POA: Diagnosis not present

## 2020-08-27 DIAGNOSIS — R1013 Epigastric pain: Secondary | ICD-10-CM | POA: Diagnosis not present

## 2020-08-27 DIAGNOSIS — R7303 Prediabetes: Secondary | ICD-10-CM | POA: Diagnosis not present

## 2020-08-27 MED ORDER — DEXAMETHASONE SODIUM PHOSPHATE 10 MG/ML IJ SOLN
INTRAMUSCULAR | Status: AC
  Filled 2020-08-27: qty 1

## 2020-08-27 MED ORDER — SUMATRIPTAN SUCCINATE 6 MG/0.5ML ~~LOC~~ SOLN
SUBCUTANEOUS | Status: AC
  Filled 2020-08-27: qty 0.5

## 2020-08-27 MED ORDER — DEXAMETHASONE SODIUM PHOSPHATE 10 MG/ML IJ SOLN
10.0000 mg | Freq: Once | INTRAMUSCULAR | Status: AC
  Administered 2020-08-27: 10 mg via INTRAMUSCULAR

## 2020-08-27 MED ORDER — SUMATRIPTAN SUCCINATE 6 MG/0.5ML ~~LOC~~ SOLN
6.0000 mg | Freq: Once | SUBCUTANEOUS | Status: AC
  Administered 2020-08-27: 6 mg via SUBCUTANEOUS

## 2020-08-27 MED ORDER — METOCLOPRAMIDE HCL 5 MG/ML IJ SOLN
INTRAMUSCULAR | Status: AC
  Filled 2020-08-27: qty 2

## 2020-08-27 MED ORDER — METOCLOPRAMIDE HCL 5 MG/ML IJ SOLN
5.0000 mg | Freq: Once | INTRAMUSCULAR | Status: AC
  Administered 2020-08-27: 5 mg via INTRAMUSCULAR

## 2020-08-27 NOTE — Discharge Instructions (Signed)
You were given injections of sumatriptan hand, metoclopramide, and dexamethasone.  Go to the emergency department if you have acute worsening symptoms.  Follow-up with your primary care provider as needed.

## 2020-08-27 NOTE — ED Provider Notes (Signed)
[facility]    CSN: [id number] Arrival date & time: 08/27/20  6811      History   Chief Complaint Chief Complaint  Patient presents with  . Migraine    HPI Brittney Watkins is a 39 y.o. female.   Patient presents with headache x2 days.  She states this is typical for her migraine headaches; currently 8/10; she attempted treatment at home with Imitrex without relief.  She denies focal weakness, dizziness, chest pain, shortness of breath, abdominal pain, nausea, vomiting, or other symptoms.  Patient has a history of migraines.  The history is provided by the patient and medical records.    Past Medical History:  Diagnosis Date  . Blurred vision   . Migraine   . UTI (urinary tract infection)     Patient Active Problem List   Diagnosis Date Noted  . Secondary dysmenorrhea 06/02/2020  . Migraine 09/08/2014  . Headache 09/08/2014  . Neck pain 09/08/2014    Past Surgical History:  Procedure Laterality Date  . CHOLECYSTECTOMY  2007  . TUBAL LIGATION  2006    OB History    Gravida  2   Para  2   Term  2   Preterm  0   AB  0   Living  2     SAB  0   IAB  0   Ectopic  0   Multiple  0   Live Births  2            Home Medications    Prior to Admission medications   Medication Sig Start Date End Date Taking? Authorizing Provider  ciprofloxacin (CIPRO) 500 MG tablet SMARTSIG:1 Tablet(s) By Mouth Every 12 Hours Patient not taking: Reported on 06/02/2020 02/10/20   [provider]  docusate sodium (COLACE) 100 MG capsule Take 1 capsule (100 mg total) by mouth 2 (two) times daily. Patient not taking: Reported on 06/02/2020 02/13/20   Wieters, Hallie C, PA-C  ferrous sulfate 325 (65 FE) MG EC tablet Take 325 mg by mouth 3 (three) times daily with meals.    [provider]  Multiple Vitamin (MULTIVITAMIN WITH MINERALS) TABS tablet Take 1 tablet by mouth at bedtime.  Patient not taking: Reported on 06/02/2020    [provider]  naproxen (NAPROSYN) 500 MG tablet Take 1 tablet (500 mg total) by mouth 2 (two) times daily as needed (pain). Patient not taking: Reported on 06/02/2020 02/13/20   Wieters, Hallie C, PA-C  ondansetron (ZOFRAN ODT) 4 MG disintegrating tablet Take 1 tablet (4 mg total) by mouth every 8 (eight) hours as needed for nausea or vomiting. Patient not taking: Reported on 06/02/2020 02/13/20   Wieters, Hallie C, PA-C  phenazopyridine (PYRIDIUM) 200 MG tablet TAKE 1 TABLET BY MOUTH THREE TIMES DAILY AFTER MEALS FOR 2 DAYS Patient not taking: Reported on 06/02/2020 02/10/20   [provider]  SUMAtriptan (IMITREX) 25 MG tablet Take 25 mg by mouth once. 01/28/20   [provider]  tamsulosin (FLOMAX) 0.4 MG CAPS capsule Take 0.4 mg by mouth daily. Patient not taking: Reported on 06/02/2020 02/10/20   [provider]  TRI-LO-SPRINTEC 0.18/0.215/0.25 MG-25 MCG tab Take 1 tablet by mouth daily. 05/05/20   [provider]  dicyclomine (BENTYL) 20 MG tablet Take 1 tablet (20 mg total) by mouth 4 (four) times daily -  before meals and at bedtime. Patient not taking: Reported on 03/27/2019 03/24/19 07/19/19  Wieters, Fran Lowes C, PA-C  fluticasone Aleda Grana)  50 MCG/ACT nasal spray Place 2 sprays into both nostrils daily. Patient not taking: Reported on 03/27/2019 12/23/18 07/19/19  Belinda Fisher, PA-C  rizatriptan (MAXALT-MLT) 5 MG disintegrating tablet DIS 1 T ON THE TONGUE QD Patient taking differently: Take 5 mg by mouth as needed for migraine.  09/17/18 07/19/19  Mardella Layman, MD    Family History Family History  Problem Relation Age of Onset  . Heart attack Father   . Diabetes Maternal Grandmother        some uncles as well   . High blood pressure Maternal Grandmother        some uncles as well  . Migraines Neg Hx     Social History Social History   Tobacco Use  . Smoking status: Never Smoker  . Smokeless tobacco: Never Used  Vaping Use  . Vaping Use: Never used   Substance Use Topics  . Alcohol use: No    Alcohol/week: 0.0 standard drinks  . Drug use: No     Allergies   Nyquil multi-symptom [pseudoeph-doxylamine-dm-apap] and Shellfish allergy   Review of Systems Review of Systems  Constitutional: Negative for chills and fever.  HENT: Negative for ear pain and sore throat.   Eyes: Negative for pain and visual disturbance.  Respiratory: Negative for cough and shortness of breath.   Cardiovascular: Negative for chest pain and palpitations.  Gastrointestinal: Negative for abdominal pain, nausea and vomiting.  Genitourinary: Negative for dysuria and hematuria.  Musculoskeletal: Negative for arthralgias and back pain.  Skin: Negative for color change and rash.  Neurological: Positive for headaches. Negative for dizziness, seizures, syncope, facial asymmetry, speech difficulty, weakness and numbness.  All other systems reviewed and are negative.    Physical Exam Triage Vital Signs ED Triage Vitals  Enc Vitals Group     BP 08/27/20 0914 94/60     Pulse Rate 08/27/20 0914 77     Resp 08/27/20 0914 18     Temp 08/27/20 0914 98.2 F (36.8 C)     Temp Source 08/27/20 0914 Oral     SpO2 08/27/20 0914 99 %     Weight --      Height --      Head Circumference --      Peak Flow --      Pain Score 08/27/20 0913 8     Pain Loc --      Pain Edu? --      Excl. in GC? --    No data found.  Updated Vital Signs BP 94/60 (BP Location: Right Arm)   Pulse 77   Temp 98.2 F (36.8 C) (Oral)   Resp 18   LMP 08/15/2020   SpO2 99%   Visual Acuity Right Eye Distance:   Left Eye Distance:   Bilateral Distance:    Right Eye Near:   Left Eye Near:    Bilateral Near:     Physical Exam Vitals and nursing note reviewed.  Constitutional:      General: She is not in acute distress.    Appearance: She is well-developed and well-nourished. She is not ill-appearing.  HENT:     Head: Normocephalic and atraumatic.     Mouth/Throat:     Mouth:  Mucous membranes are moist.  Eyes:     Conjunctiva/sclera: Conjunctivae normal.  Cardiovascular:     Rate and Rhythm: Normal rate and regular rhythm.     Heart sounds: Normal heart sounds.  Pulmonary:     Effort: Pulmonary effort  is normal. No respiratory distress.     Breath sounds: Normal breath sounds.  Abdominal:     Palpations: Abdomen is soft.     Tenderness: There is no abdominal tenderness.  Musculoskeletal:        General: No edema.     Cervical back: Neck supple.  Skin:    General: Skin is warm and dry.     Findings: No rash.  Neurological:     General: No focal deficit present.     Mental Status: She is alert and oriented to person, place, and time.     Sensory: No sensory deficit.     Motor: No weakness.     Coordination: Coordination normal.     Gait: Gait normal.  Psychiatric:        Mood and Affect: Mood and affect and mood normal.        Behavior: Behavior normal.      UC Treatments / Results  Labs (all labs ordered are listed, but only abnormal results are displayed) Labs Reviewed - No data to display  EKG   Radiology No results found.  Procedures Procedures (including critical care time)  Medications Ordered in UC Medications  metoCLOPramide (REGLAN) injection 5 mg (has no administration in time range)  dexamethasone (DECADRON) injection 10 mg (has no administration in time range)  SUMAtriptan (IMITREX) injection 6 mg (has no administration in time range)    Initial Impression / Assessment and Plan / UC Course  I have reviewed the triage vital signs and the nursing notes.  Pertinent labs & imaging results that were available during my care of the patient were reviewed by me and considered in my medical decision making (see chart for details).   Migraine headache.  Treated with sumatriptan, dexamethasone, metoclopramide.  Instructed patient to go to the emergency department if she has acute worsening symptoms.  Instructed her to follow-up  with her PCP as needed if her symptoms are not improving.  Patient agrees to plan of care.   Final Clinical Impressions(s) / UC Diagnoses   Final diagnoses:  Migraine without status migrainosus, not intractable, unspecified migraine type     Discharge Instructions     You were given injections of sumatriptan hand, metoclopramide, and dexamethasone.  Go to the emergency department if you have acute worsening symptoms.  Follow-up with your primary care provider as needed.        ED Prescriptions    None     PDMP not reviewed this encounter.   Mickie Bail, NP 08/27/20 [phone]

## 2020-08-27 NOTE — ED Triage Notes (Signed)
Pt presents with ongoing migraine: pt states sometime her regularly prescribed medication does not relieve her migraine.

## 2020-10-15 DIAGNOSIS — Z20822 Contact with and (suspected) exposure to covid-19: Secondary | ICD-10-CM | POA: Diagnosis not present

## 2020-10-15 DIAGNOSIS — R350 Frequency of micturition: Secondary | ICD-10-CM | POA: Diagnosis not present

## 2020-10-19 DIAGNOSIS — R051 Acute cough: Secondary | ICD-10-CM | POA: Diagnosis not present

## 2020-10-19 DIAGNOSIS — R0981 Nasal congestion: Secondary | ICD-10-CM | POA: Diagnosis not present

## 2020-10-19 DIAGNOSIS — Z20822 Contact with and (suspected) exposure to covid-19: Secondary | ICD-10-CM | POA: Diagnosis not present

## 2020-10-19 DIAGNOSIS — R509 Fever, unspecified: Secondary | ICD-10-CM | POA: Diagnosis not present

## 2020-10-25 DIAGNOSIS — U071 COVID-19: Secondary | ICD-10-CM | POA: Diagnosis not present

## 2020-12-15 DIAGNOSIS — R1013 Epigastric pain: Secondary | ICD-10-CM | POA: Diagnosis not present

## 2020-12-15 DIAGNOSIS — G43909 Migraine, unspecified, not intractable, without status migrainosus: Secondary | ICD-10-CM | POA: Diagnosis not present

## 2020-12-15 DIAGNOSIS — R7303 Prediabetes: Secondary | ICD-10-CM | POA: Diagnosis not present

## 2020-12-15 DIAGNOSIS — N946 Dysmenorrhea, unspecified: Secondary | ICD-10-CM | POA: Diagnosis not present

## 2020-12-15 DIAGNOSIS — E559 Vitamin D deficiency, unspecified: Secondary | ICD-10-CM | POA: Diagnosis not present

## 2020-12-15 DIAGNOSIS — N926 Irregular menstruation, unspecified: Secondary | ICD-10-CM | POA: Diagnosis not present

## 2020-12-15 DIAGNOSIS — E785 Hyperlipidemia, unspecified: Secondary | ICD-10-CM | POA: Diagnosis not present

## 2021-02-03 DIAGNOSIS — Z0000 Encounter for general adult medical examination without abnormal findings: Secondary | ICD-10-CM | POA: Diagnosis not present

## 2021-02-03 DIAGNOSIS — J0100 Acute maxillary sinusitis, unspecified: Secondary | ICD-10-CM | POA: Diagnosis not present

## 2021-02-03 DIAGNOSIS — N946 Dysmenorrhea, unspecified: Secondary | ICD-10-CM | POA: Diagnosis not present

## 2021-02-03 DIAGNOSIS — G43909 Migraine, unspecified, not intractable, without status migrainosus: Secondary | ICD-10-CM | POA: Diagnosis not present

## 2021-02-03 DIAGNOSIS — Z113 Encounter for screening for infections with a predominantly sexual mode of transmission: Secondary | ICD-10-CM | POA: Diagnosis not present

## 2021-02-03 DIAGNOSIS — Z131 Encounter for screening for diabetes mellitus: Secondary | ICD-10-CM | POA: Diagnosis not present

## 2021-02-03 DIAGNOSIS — N926 Irregular menstruation, unspecified: Secondary | ICD-10-CM | POA: Diagnosis not present

## 2021-02-03 DIAGNOSIS — R1013 Epigastric pain: Secondary | ICD-10-CM | POA: Diagnosis not present

## 2021-02-03 DIAGNOSIS — E785 Hyperlipidemia, unspecified: Secondary | ICD-10-CM | POA: Diagnosis not present

## 2021-02-03 DIAGNOSIS — R7303 Prediabetes: Secondary | ICD-10-CM | POA: Diagnosis not present

## 2021-02-03 DIAGNOSIS — E559 Vitamin D deficiency, unspecified: Secondary | ICD-10-CM | POA: Diagnosis not present

## 2021-02-13 DIAGNOSIS — H5213 Myopia, bilateral: Secondary | ICD-10-CM | POA: Diagnosis not present

## 2021-03-29 ENCOUNTER — Ambulatory Visit
Admission: EM | Admit: 2021-03-29 | Discharge: 2021-03-29 | Disposition: A | Discharge status: Home / Self Care | Payer: Medicaid Other | Attending: Emergency Medicine | Admitting: Emergency Medicine

## 2021-03-29 DIAGNOSIS — E785 Hyperlipidemia, unspecified: Secondary | ICD-10-CM | POA: Diagnosis not present

## 2021-03-29 DIAGNOSIS — E559 Vitamin D deficiency, unspecified: Secondary | ICD-10-CM | POA: Diagnosis not present

## 2021-03-29 DIAGNOSIS — G43009 Migraine without aura, not intractable, without status migrainosus: Secondary | ICD-10-CM

## 2021-03-29 DIAGNOSIS — R1013 Epigastric pain: Secondary | ICD-10-CM | POA: Diagnosis not present

## 2021-03-29 DIAGNOSIS — N946 Dysmenorrhea, unspecified: Secondary | ICD-10-CM | POA: Diagnosis not present

## 2021-03-29 DIAGNOSIS — J0100 Acute maxillary sinusitis, unspecified: Secondary | ICD-10-CM | POA: Diagnosis not present

## 2021-03-29 DIAGNOSIS — N926 Irregular menstruation, unspecified: Secondary | ICD-10-CM | POA: Diagnosis not present

## 2021-03-29 DIAGNOSIS — G43909 Migraine, unspecified, not intractable, without status migrainosus: Secondary | ICD-10-CM | POA: Diagnosis not present

## 2021-03-29 DIAGNOSIS — R7303 Prediabetes: Secondary | ICD-10-CM | POA: Diagnosis not present

## 2021-03-29 MED ORDER — KETOROLAC TROMETHAMINE 30 MG/ML IJ SOLN
30.0000 mg | Freq: Once | INTRAMUSCULAR | Status: AC
  Administered 2021-03-29: 30 mg via INTRAMUSCULAR

## 2021-03-29 MED ORDER — DEXAMETHASONE SODIUM PHOSPHATE 10 MG/ML IJ SOLN
10.0000 mg | Freq: Once | INTRAMUSCULAR | Status: AC
  Administered 2021-03-29: 10 mg via INTRAMUSCULAR

## 2021-03-29 MED ORDER — METOCLOPRAMIDE HCL 5 MG/ML IJ SOLN
5.0000 mg | Freq: Once | INTRAMUSCULAR | Status: AC
  Administered 2021-03-29: 5 mg via INTRAMUSCULAR

## 2021-03-29 NOTE — ED Provider Notes (Signed)
[facility]    CSN: [id number] Arrival date & time: 03/29/21  1332      History   Chief Complaint Chief Complaint  Patient presents with   Migraine    HPI Brittney Watkins is a 40 y.o. female history of migraines presenting today for evaluation of a migraine.  Headache x2 Days located on right side.  Typical of prior migraines that have resolved with using migraine cocktail.  She has used Imitrex without relief of symptoms.  Reports prior photophobia, but denies at present.  Denies any nausea or vomiting.  Denies vision changes.  Denies recent URI symptoms.  Has reported some mild dizziness/lightheadedness.  Denies any one-sided weakness numbness or tingling.  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 Authorizing Provider  ferrous sulfate 325 (65 FE) MG EC tablet Take 325 mg by mouth 3 (three) times daily with meals.   Yes [provider]  SUMAtriptan (IMITREX) 25 MG tablet Take 25 mg by mouth once. 01/28/20  Yes [provider]  TRI-LO-SPRINTEC 0.18/0.215/0.25 MG-25 MCG tab Take 1 tablet by mouth daily. 05/05/20  Yes [provider]  dicyclomine (BENTYL) 20 MG tablet Take 1 tablet (20 mg total) by mouth 4 (four) times daily -  before meals and at bedtime. Patient not taking: Reported on 03/27/2019 03/24/19 07/19/19  Athalie Newhard C, PA-C   fluticasone (FLONASE) 50 MCG/ACT nasal spray Place 2 sprays into both nostrils daily. Patient not taking: Reported on 03/27/2019 12/23/18 07/19/19  Belinda Fisher, PA-C  rizatriptan (MAXALT-MLT) 5 MG disintegrating tablet DIS 1 T ON THE TONGUE QD Patient taking differently: Take 5 mg by mouth as needed for migraine.  09/17/18 07/19/19  Mardella Layman, MD    Family History Family History  Problem Relation Age of Onset   Heart attack Father    Diabetes Maternal Grandmother        some uncles as well    High blood pressure Maternal Grandmother        some uncles as well   Migraines Neg Hx     Social History Social History   Tobacco Use   Smoking status: Never   Smokeless tobacco: Never  Vaping Use   Vaping Use: Never used  Substance Use Topics   Alcohol use: No    Alcohol/week: 0.0 standard drinks   Drug use: No     Allergies   Nyquil multi-symptom [pseudoeph-doxylamine-dm-apap] and Shellfish allergy   Review of Systems Review of Systems  Constitutional:  Negative for fatigue and fever.  HENT:  Negative  for congestion, sinus pressure and sore throat.   Eyes:  Negative for photophobia, pain and visual disturbance.  Respiratory:  Negative for cough and shortness of breath.   Cardiovascular:  Negative for chest pain.  Gastrointestinal:  Negative for abdominal pain, nausea and vomiting.  Genitourinary:  Negative for decreased urine volume and hematuria.  Musculoskeletal:  Negative for myalgias, neck pain and neck stiffness.  Neurological:  Positive for headaches. Negative for dizziness, syncope, facial asymmetry, speech difficulty, weakness, light-headedness and numbness.    Physical Exam Triage Vital Signs ED Triage Vitals  Enc Vitals Group     BP      Pulse      Resp      Temp      Temp src      SpO2      Weight      Height      Head Circumference      Peak Flow      Pain Score      Pain Loc      Pain Edu?      Excl. in GC?    No data found.  Updated Vital  Signs BP 116/85 (BP Location: Right Arm)   Pulse 76   Temp 98.6 F (37 C) (Oral)   Resp 18   SpO2 97%   Visual Acuity Right Eye Distance:   Left Eye Distance:   Bilateral Distance:    Right Eye Near:   Left Eye Near:    Bilateral Near:     Physical Exam Vitals and nursing note reviewed.  Constitutional:      Appearance: She is well-developed.     Comments: No acute distress  HENT:     Head: Normocephalic and atraumatic.     Ears:     Comments: Bilateral ears without tenderness to palpation of external auricle, tragus and mastoid, EAC's without erythema or swelling, TM's with good bony landmarks and cone of light. Non erythematous.      Nose: Nose normal.     Mouth/Throat:     Comments: Oral mucosa pink and moist, no tonsillar enlargement or exudate. Posterior pharynx patent and nonerythematous, no uvula deviation or swelling. Normal phonation.  Eyes:     Extraocular Movements: Extraocular movements intact.     Conjunctiva/sclera: Conjunctivae normal.     Pupils: Pupils are equal, round, and reactive to light.  Cardiovascular:     Rate and Rhythm: Normal rate and regular rhythm.  Pulmonary:     Effort: Pulmonary effort is normal. No respiratory distress.     Comments: Breathing comfortably at rest, CTABL, no wheezing, rales or other adventitious sounds auscultated  Abdominal:     General: There is no distension.  Musculoskeletal:        General: Normal range of motion.     Cervical back: Neck supple.  Skin:    General: Skin is warm and dry.  Neurological:     General: No focal deficit present.     Mental Status: She is alert and oriented to person, place, and time. Mental status is at baseline.     Cranial Nerves: No cranial nerve deficit.     Motor: No weakness.     Gait: Gait normal.     UC Treatments / Results  Labs (all labs ordered are listed, but only abnormal results are displayed) Labs Reviewed - No data to display  EKG   Radiology No results  found.  Procedures Procedures (including critical care time)  Medications Ordered in UC Medications  ketorolac (TORADOL) 30 MG/ML injection 30 mg (has no administration in time range)  metoCLOPramide (REGLAN) injection 5 mg (has no administration in time range)  dexamethasone (DECADRON) injection 10 mg (has no administration in time range)    Initial Impression / Assessment and Plan / UC Course  I have reviewed the triage vital signs and the nursing notes.  Pertinent labs & imaging results that were available during my care of the patient were reviewed by me and considered in my medical decision making (see chart for details).     Migraine-typical of prior migraines that have resolved with migraine cocktail, will repeat today with Toradol Decadron and Reglan, no neurodeficits, discussed red flags,Discussed strict return precautions. Patient verbalized understanding and is agreeable with plan.  Final Clinical Impressions(s) / UC Diagnoses   Final diagnoses:  Migraine without aura and without status migrainosus, not intractable     Discharge Instructions      We gave you a shot of Toradol, Decadron and Reglan today for your headache Rest and drink plenty of fluids Please monitor for resolution of headache, follow-up in the emergency room if symptoms persistent, worsening/changing     ED Prescriptions   None    PDMP not reviewed this encounter.   Lew Dawes, [place] 03/29/21 1417

## 2021-03-29 NOTE — Discharge Instructions (Addendum)
We gave you a shot of Toradol, Decadron and Reglan today for your headache Rest and drink plenty of fluids Please monitor for resolution of headache, follow-up in the emergency room if symptoms persistent, worsening/changing

## 2021-03-29 NOTE — ED Triage Notes (Signed)
Pt presents for migraine starting night before last.  Somewhat improved yesterday with meds but still there today.  States she usually comes in for migraine cocktail and will be fine for months. Right sided throbbing migraine.  Had photosensitivity yesterday but not today.

## 2021-03-30 DIAGNOSIS — N946 Dysmenorrhea, unspecified: Secondary | ICD-10-CM | POA: Diagnosis not present

## 2021-03-30 DIAGNOSIS — R1013 Epigastric pain: Secondary | ICD-10-CM | POA: Diagnosis not present

## 2021-03-30 DIAGNOSIS — R7303 Prediabetes: Secondary | ICD-10-CM | POA: Diagnosis not present

## 2021-03-30 DIAGNOSIS — N926 Irregular menstruation, unspecified: Secondary | ICD-10-CM | POA: Diagnosis not present

## 2021-03-30 DIAGNOSIS — E785 Hyperlipidemia, unspecified: Secondary | ICD-10-CM | POA: Diagnosis not present

## 2021-03-30 DIAGNOSIS — E559 Vitamin D deficiency, unspecified: Secondary | ICD-10-CM | POA: Diagnosis not present

## 2021-03-30 DIAGNOSIS — G43909 Migraine, unspecified, not intractable, without status migrainosus: Secondary | ICD-10-CM | POA: Diagnosis not present

## 2021-04-01 DIAGNOSIS — F5104 Psychophysiologic insomnia: Secondary | ICD-10-CM | POA: Diagnosis not present

## 2021-04-01 DIAGNOSIS — R7303 Prediabetes: Secondary | ICD-10-CM | POA: Diagnosis not present

## 2021-04-01 DIAGNOSIS — N946 Dysmenorrhea, unspecified: Secondary | ICD-10-CM | POA: Diagnosis not present

## 2021-04-01 DIAGNOSIS — E785 Hyperlipidemia, unspecified: Secondary | ICD-10-CM | POA: Diagnosis not present

## 2021-04-01 DIAGNOSIS — N926 Irregular menstruation, unspecified: Secondary | ICD-10-CM | POA: Diagnosis not present

## 2021-04-01 DIAGNOSIS — G43909 Migraine, unspecified, not intractable, without status migrainosus: Secondary | ICD-10-CM | POA: Diagnosis not present

## 2021-04-01 DIAGNOSIS — E559 Vitamin D deficiency, unspecified: Secondary | ICD-10-CM | POA: Diagnosis not present

## 2021-04-01 DIAGNOSIS — R1013 Epigastric pain: Secondary | ICD-10-CM | POA: Diagnosis not present

## 2021-04-01 DIAGNOSIS — N289 Disorder of kidney and ureter, unspecified: Secondary | ICD-10-CM | POA: Diagnosis not present

## 2021-04-19 DIAGNOSIS — F5104 Psychophysiologic insomnia: Secondary | ICD-10-CM | POA: Diagnosis not present

## 2021-04-19 DIAGNOSIS — G43909 Migraine, unspecified, not intractable, without status migrainosus: Secondary | ICD-10-CM | POA: Diagnosis not present

## 2021-04-19 DIAGNOSIS — N946 Dysmenorrhea, unspecified: Secondary | ICD-10-CM | POA: Diagnosis not present

## 2021-04-19 DIAGNOSIS — E785 Hyperlipidemia, unspecified: Secondary | ICD-10-CM | POA: Diagnosis not present

## 2021-04-19 DIAGNOSIS — N926 Irregular menstruation, unspecified: Secondary | ICD-10-CM | POA: Diagnosis not present

## 2021-04-19 DIAGNOSIS — E559 Vitamin D deficiency, unspecified: Secondary | ICD-10-CM | POA: Diagnosis not present

## 2021-04-19 DIAGNOSIS — R1013 Epigastric pain: Secondary | ICD-10-CM | POA: Diagnosis not present

## 2021-04-19 DIAGNOSIS — R7303 Prediabetes: Secondary | ICD-10-CM | POA: Diagnosis not present

## 2021-04-19 DIAGNOSIS — J22 Unspecified acute lower respiratory infection: Secondary | ICD-10-CM | POA: Diagnosis not present

## 2021-04-19 DIAGNOSIS — N289 Disorder of kidney and ureter, unspecified: Secondary | ICD-10-CM | POA: Diagnosis not present

## 2021-05-03 ENCOUNTER — Ambulatory Visit: Payer: Medicaid Other

## 2021-05-03 ENCOUNTER — Other Ambulatory Visit: Payer: Self-pay

## 2021-05-03 ENCOUNTER — Ambulatory Visit
Admission: RE | Admit: 2021-05-03 | Discharge: 2021-05-03 | Disposition: A | Discharge status: Home / Self Care | Payer: Medicaid Other | Source: Ambulatory Visit | Attending: Emergency Medicine | Admitting: Emergency Medicine

## 2021-05-03 VITALS — BP 123/82 | HR 73 | Temp 98.7°F | Resp 17

## 2021-05-03 DIAGNOSIS — G43909 Migraine, unspecified, not intractable, without status migrainosus: Secondary | ICD-10-CM | POA: Diagnosis not present

## 2021-05-03 MED ORDER — SUMATRIPTAN SUCCINATE 6 MG/0.5ML ~~LOC~~ SOLN
6.0000 mg | Freq: Once | SUBCUTANEOUS | Status: AC
  Administered 2021-05-03: 6 mg via SUBCUTANEOUS

## 2021-05-03 MED ORDER — DEXAMETHASONE SODIUM PHOSPHATE 10 MG/ML IJ SOLN
10.0000 mg | Freq: Once | INTRAMUSCULAR | Status: AC
  Administered 2021-05-03: 10 mg via INTRAMUSCULAR

## 2021-05-03 MED ORDER — FLUTICASONE PROPIONATE 50 MCG/ACT NA SUSP: 1.0000 | Freq: Every day | NASAL | 0 refills | Status: DC

## 2021-05-03 MED ORDER — TIZANIDINE HCL 2 MG PO TABS: 2.0000 mg | ORAL_TABLET | Freq: Four times a day (QID) | ORAL | 0 refills | Status: DC | PRN

## 2021-05-03 MED ORDER — KETOROLAC TROMETHAMINE 30 MG/ML IJ SOLN
30.0000 mg | Freq: Once | INTRAMUSCULAR | Status: AC
  Administered 2021-05-03: 30 mg via INTRAMUSCULAR

## 2021-05-03 MED ORDER — METOCLOPRAMIDE HCL 5 MG/ML IJ SOLN
5.0000 mg | Freq: Once | INTRAMUSCULAR | Status: AC
  Administered 2021-05-03: 5 mg via INTRAMUSCULAR

## 2021-05-03 MED ORDER — CETIRIZINE HCL 10 MG PO CAPS: 10.0000 mg | ORAL_CAPSULE | Freq: Every day | ORAL | 0 refills | Status: DC

## 2021-05-03 NOTE — Discharge Instructions (Addendum)
We gave you Toradol Decadron Reglan and Imitrex today for your headache Please follow-up with neurology May try tizanidine at home/bedtime for neck discomfort Daily Flonase and Zyrtec to help with underlying sinus inflammation Drink plenty of water and fluids Follow-up if not improving or worsening

## 2021-05-03 NOTE — ED Provider Notes (Signed)
[facility]    CSN: [id number] Arrival date & time: 05/03/21  1345      History   Chief Complaint Chief Complaint  Patient presents with   Migraine    HPI Brittney Watkins is a 40 y.o. female history of migraines presenting today for evaluation of headache.  Reports that she began to develop headache yesterday after mowing the grass.  Headache has slightly eased off today, but still persistent and concerned about symptoms worsening later tonight.  Has history of recurrent migraines and symptoms feel similar.  Has needed migraine cocktails in the past.  Previously saw neurology, but no longer follows up due to missed visits.  Has plans to follow-up with a new neurologist once her new job his insurance kicks in.  Reports photophobia yesterday, but denies today.  Denies any nausea or vomiting.  Has tried Imitrex orally without relief.  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 Authorizing Provider  Cetirizine HCl 10 MG CAPS Take 1 capsule (10 mg total) by mouth daily for 10 days. 05/03/21 05/13/21 Yes Desirea Mizrahi C, PA-C  fluticasone (FLONASE) 50 MCG/ACT nasal spray Place 1-2 sprays into both nostrils daily. 05/03/21  Yes Belicia Difatta C, PA-C  tiZANidine (ZANAFLEX) 2 MG tablet Take 1-2 tablets (2-4 mg total) by mouth every 6 (six) hours as needed for muscle  spasms. 05/03/21  Yes Teghan Philbin C, PA-C  ferrous sulfate 325 (65 FE) MG EC tablet Take 325 mg by mouth 3 (three) times daily with meals.    [provider]  SUMAtriptan (IMITREX) 25 MG tablet Take 25 mg by mouth once. 01/28/20   [provider]  TRI-LO-SPRINTEC 0.18/0.215/0.25 MG-25 MCG tab Take 1 tablet by mouth daily. 05/05/20   [provider]  dicyclomine (BENTYL) 20 MG tablet Take 1 tablet (20 mg total) by mouth 4 (four) times daily -  before meals and at bedtime. Patient not taking: Reported on 03/27/2019 03/24/19 07/19/19  Symeon Puleo C, PA-C  rizatriptan (MAXALT-MLT) 5 MG disintegrating tablet DIS 1 T ON THE TONGUE QD Patient taking differently: Take 5 mg by mouth as needed for migraine.  09/17/18 07/19/19  Mardella Layman, MD    Family History Family History  Problem Relation Age of Onset   Heart attack Father    Diabetes Maternal Grandmother        some uncles as well    High blood pressure Maternal Grandmother        some uncles as well   Migraines Neg Hx     Social  History Social History   Tobacco Use   Smoking status: Never   Smokeless tobacco: Never  Vaping Use   Vaping Use: Never used  Substance Use Topics   Alcohol use: No    Alcohol/week: 0.0 standard drinks   Drug use: No     Allergies   Nyquil multi-symptom [pseudoeph-doxylamine-dm-apap] and Shellfish allergy   Review of Systems Review of Systems  Constitutional:  Negative for fatigue and fever.  HENT:  Negative for congestion, sinus pressure and sore throat.   Eyes:  Negative for photophobia, pain and visual disturbance.  Respiratory:  Negative for cough and shortness of breath.   Cardiovascular:  Negative for chest pain.  Gastrointestinal:  Negative for abdominal pain, nausea and vomiting.  Genitourinary:  Negative for decreased urine volume and hematuria.  Musculoskeletal:  Negative for myalgias, neck pain and neck stiffness.  Neurological:  Positive for headaches.  Negative for dizziness, syncope, facial asymmetry, speech difficulty, weakness, light-headedness and numbness.    Physical Exam Triage Vital Signs ED Triage Vitals  Enc Vitals Group     BP 05/03/21 1358 123/82     Pulse Rate 05/03/21 1358 73     Resp 05/03/21 1358 17     Temp 05/03/21 1358 98.7 F (37.1 C)     Temp Source 05/03/21 1358 Oral     SpO2 05/03/21 1358 98 %     Weight --      Height --      Head Circumference --      Peak Flow --      Pain Score 05/03/21 1356 6     Pain Loc --      Pain Edu? --      Excl. in GC? --    No data found.  Updated Vital Signs BP 123/82 (BP Location: Left Arm)   Pulse 73   Temp 98.7 F (37.1 C) (Oral)   Resp 17   LMP 04/12/2021   SpO2 98%   Visual Acuity Right Eye Distance:   Left Eye Distance:   Bilateral Distance:    Right Eye Near:   Left Eye Near:    Bilateral Near:     Physical Exam Vitals and nursing note reviewed.  Constitutional:      Appearance: She is well-developed.     Comments: No acute distress  HENT:     Head: Normocephalic and atraumatic.     Ears:     Comments: Bilateral ears without tenderness to palpation of external auricle, tragus and mastoid, EAC's without erythema or swelling, TM's with good bony landmarks and cone of light. Non erythematous.      Nose: Nose normal.     Mouth/Throat:     Comments: Oral mucosa pink and moist, no tonsillar enlargement or exudate. Posterior pharynx patent and nonerythematous, no uvula deviation or swelling. Normal phonation.  Eyes:     Extraocular Movements: Extraocular movements intact.     Conjunctiva/sclera: Conjunctivae normal.     Pupils: Pupils are equal, round, and reactive to light.  Cardiovascular:     Rate and Rhythm: Normal rate.  Pulmonary:     Effort: Pulmonary effort is normal. No respiratory distress.     Comments: Breathing comfortably at rest, CTABL, no wheezing, rales or other adventitious sounds auscultated  Abdominal:     General: There  is no distension.  Musculoskeletal:        General: Normal range of motion.     Cervical back: Neck supple.  Skin:  General: Skin is warm and dry.  Neurological:     Mental Status: She is alert and oriented to person, place, and time.     UC Treatments / Results  Labs (all labs ordered are listed, but only abnormal results are displayed) Labs Reviewed - No data to display  EKG   Radiology No results found.  Procedures Procedures (including critical care time)  Medications Ordered in UC Medications  ketorolac (TORADOL) 30 MG/ML injection 30 mg (30 mg Intramuscular Given 05/03/21 1431)  metoCLOPramide (REGLAN) injection 5 mg (5 mg Intramuscular Given 05/03/21 1431)  dexamethasone (DECADRON) injection 10 mg (10 mg Intramuscular Given 05/03/21 1431)  SUMAtriptan (IMITREX) injection 6 mg (6 mg Subcutaneous Given 05/03/21 1431)    Initial Impression / Assessment and Plan / UC Course  I have reviewed the triage vital signs and the nursing notes.  Pertinent labs & imaging results that were available during my care of the patient were reviewed by me and considered in my medical decision making (see chart for details).     Migraine-treating with migraine cocktail with Toradol Reglan Decadron Imitrex.  Discussed initiating Zyrtec and Flonase to help with any underlying sinus inflammation contributing to symptoms given symptoms started after cutting the grass yesterday.  Trial of tizanidine to use as needed at bedtime for any neck discomfort.  Follow-up with neurology for evaluation of more frequent headaches.  Discussed strict return precautions. Patient verbalized understanding and is agreeable with plan.  Final Clinical Impressions(s) / UC Diagnoses   Final diagnoses:  Migraine without status migrainosus, not intractable, unspecified migraine type     Discharge Instructions      We gave you Toradol Decadron Reglan and Imitrex today for your headache Please follow-up with  neurology May try tizanidine at home/bedtime for neck discomfort Daily Flonase and Zyrtec to help with underlying sinus inflammation Drink plenty of water and fluids Follow-up if not improving or worsening   ED Prescriptions     Medication Sig Dispense Auth. Provider   tiZANidine (ZANAFLEX) 2 MG tablet Take 1-2 tablets (2-4 mg total) by mouth every 6 (six) hours as needed for muscle spasms. 30 tablet Asmara Backs C, PA-C   fluticasone (FLONASE) 50 MCG/ACT nasal spray Place 1-2 sprays into both nostrils daily. 16 g Leomar Westberg C, PA-C   Cetirizine HCl 10 MG CAPS Take 1 capsule (10 mg total) by mouth daily for 10 days. 10 capsule Garrin Kirwan, Rea C, PA-C      PDMP not reviewed this encounter.   Lew Dawes, [place] 05/03/21 1446

## 2021-05-03 NOTE — ED Triage Notes (Signed)
Pt presents with migraine that started Yesterday after mowing the lawn. States has come and gone since.

## 2021-05-25 DIAGNOSIS — Z01419 Encounter for gynecological examination (general) (routine) without abnormal findings: Secondary | ICD-10-CM | POA: Diagnosis not present

## 2021-05-25 DIAGNOSIS — Z1151 Encounter for screening for human papillomavirus (HPV): Secondary | ICD-10-CM | POA: Diagnosis not present

## 2021-05-25 DIAGNOSIS — Z113 Encounter for screening for infections with a predominantly sexual mode of transmission: Secondary | ICD-10-CM | POA: Diagnosis not present

## 2021-06-02 IMAGING — US US PELVIS COMPLETE
1 series · 13 of 25 positions shown · non-contrast
Comparison: 07/24/2015

CLINICAL DATA: Pelvic pain worse with every cycle

EXAM:
TRANSABDOMINAL AND TRANSVAGINAL ULTRASOUND OF PELVIS
DOPPLER ULTRASOUND OF OVARIES
TECHNIQUE: Both transabdominal and transvaginal ultrasound examinations of the
pelvis were performed. Transabdominal technique was performed for
global imaging of the pelvis including uterus, ovaries, adnexal
regions, and pelvic cul-de-sac.
It was necessary to proceed with endovaginal exam following the
transabdominal exam to visualize the endometrium and ovaries. Color
and duplex Doppler ultrasound was utilized to evaluate blood flow to
the ovaries.

[Series 1: us pelvis complete · 13 of 64 slices shown]
[im 1/64]
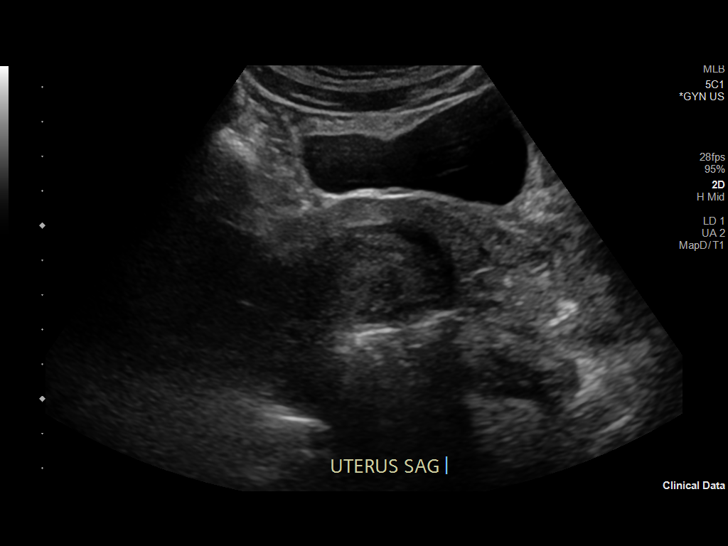
[im 6/64]
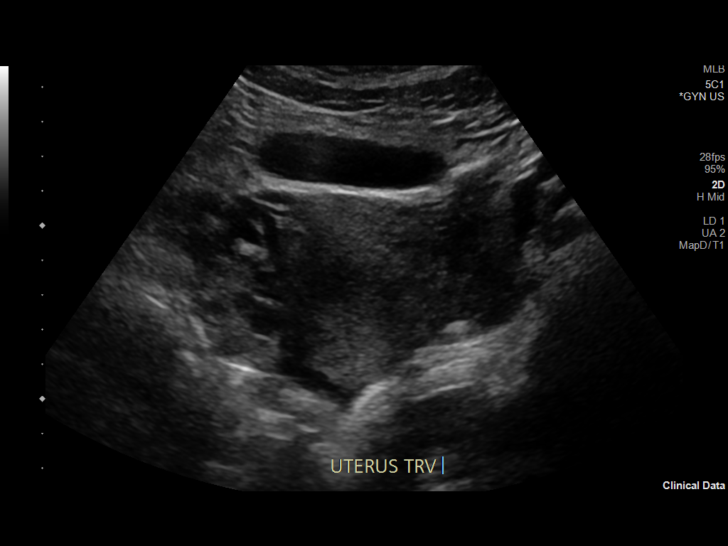
[im 11/64]
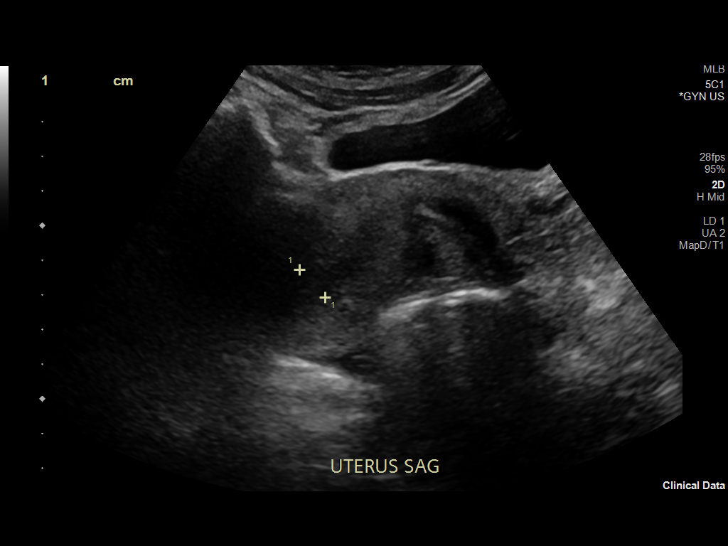
[im 16/64]
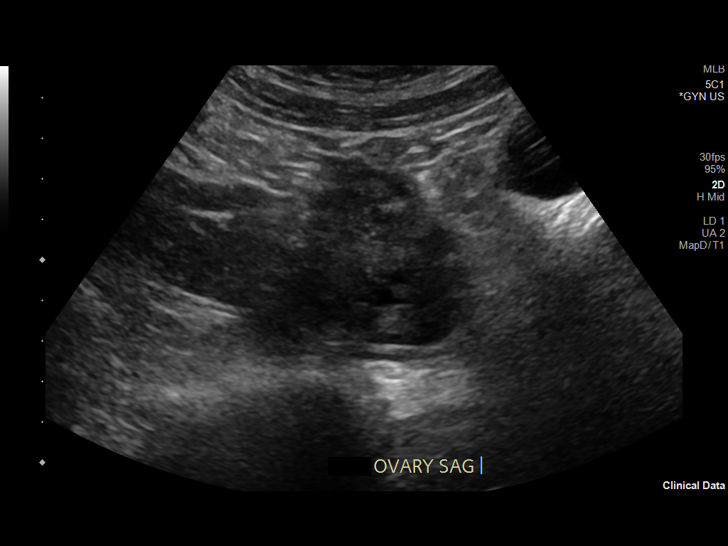
[im 22/64]
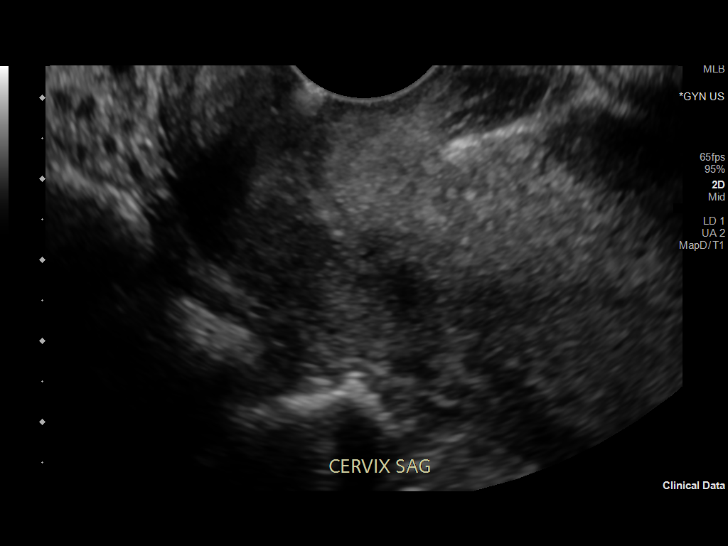
[im 27/64]
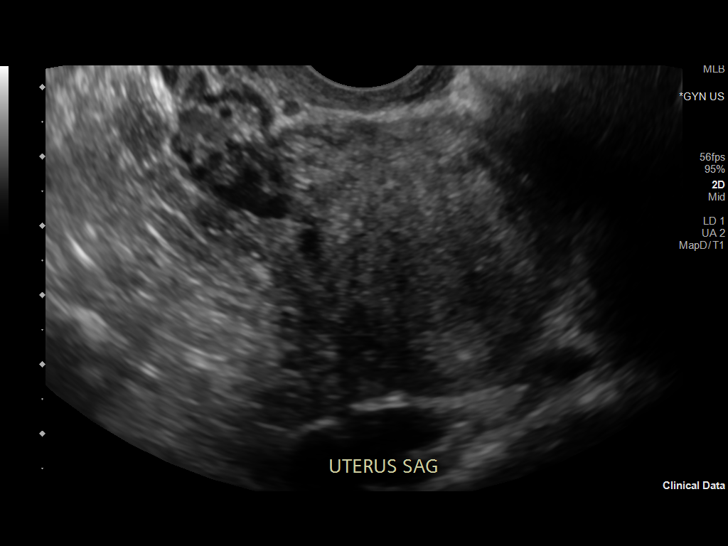
[im 32/64]
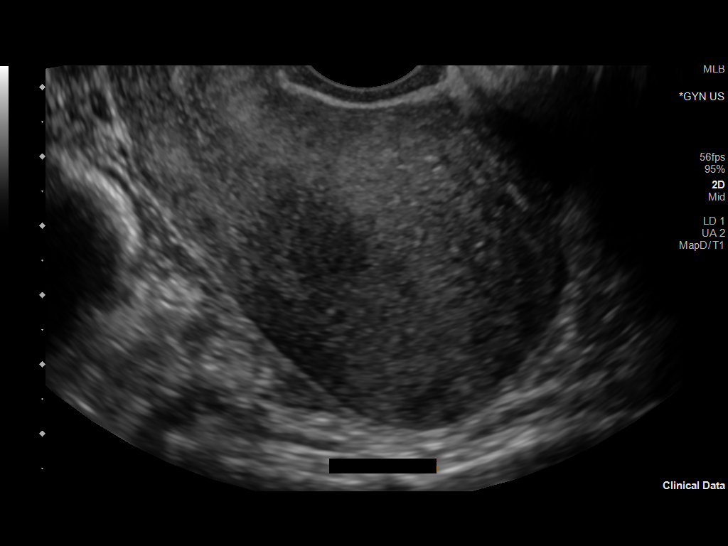
[im 37/64]
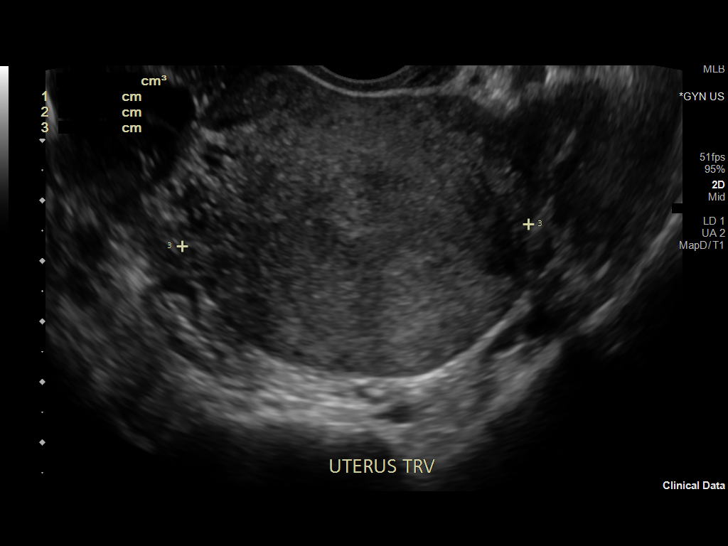
[im 43/64]
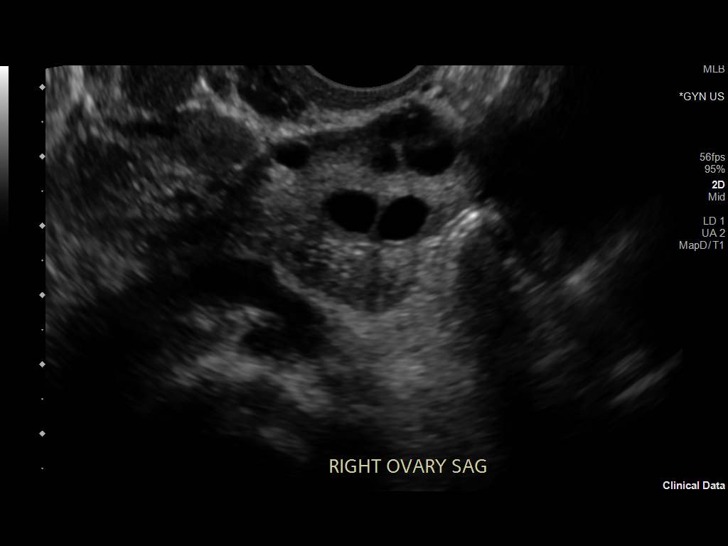
[im 48/64]
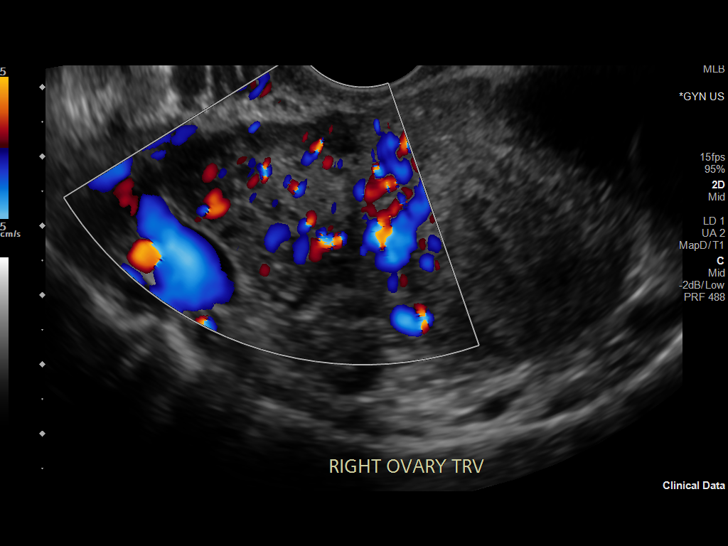
[im 53/64]
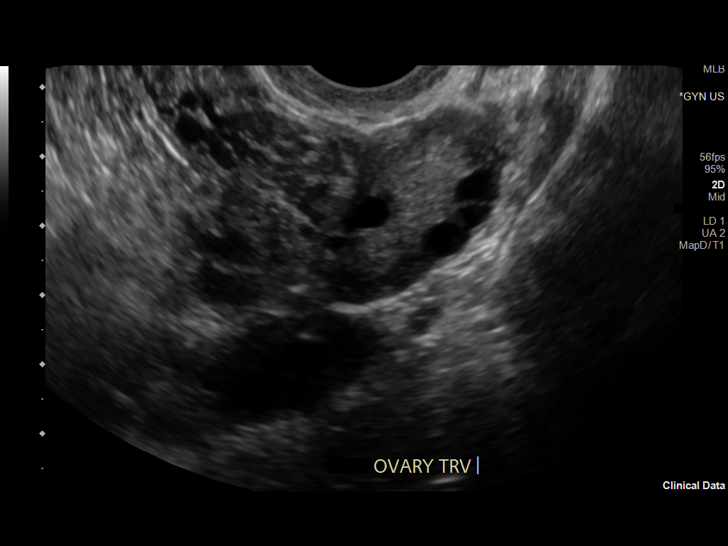
[im 58/64]
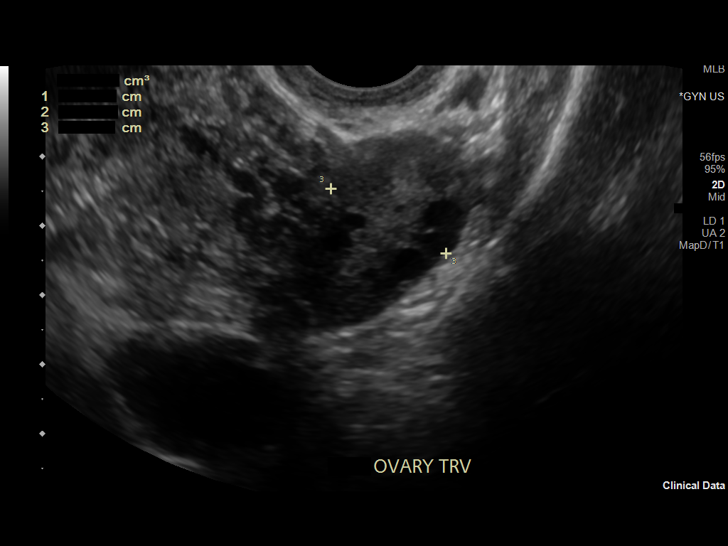
[im 64/64]
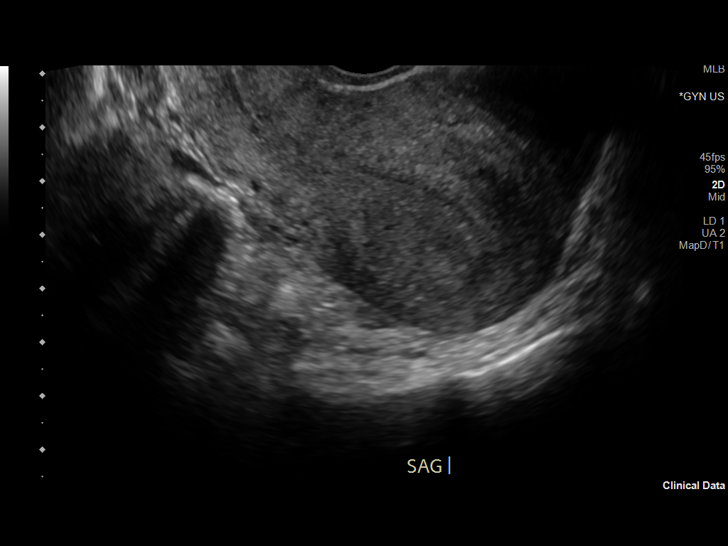

[13 of 25 positions shown; findings below may reference images not displayed]

FINDINGS: Uterus

Measurements: 8.3 x 4.7 x 5.7 cm = volume: 117 mL. No fibroids or
other mass visualized.

Endometrium

Thickness: 8.2 mm.  No focal abnormality visualized.

Right ovary

Measurements: 3.3 x 2.3 x 2.8 cm = volume: 11.3 mL. Normal
appearance/no adnexal mass.

Left ovary

Measurements: 2.7 x 2.2 x 1.9 cm = volume: 6 mL. Normal
appearance/no adnexal mass.

Pulsed Doppler evaluation of both ovaries demonstrates normal
low-resistance arterial and venous waveforms.

Other findings

Trace pelvic free fluid.
IMPRESSION: 1. Normal pelvic ultrasound.

## 2021-07-12 DIAGNOSIS — F5104 Psychophysiologic insomnia: Secondary | ICD-10-CM | POA: Diagnosis not present

## 2021-07-12 DIAGNOSIS — N946 Dysmenorrhea, unspecified: Secondary | ICD-10-CM | POA: Diagnosis not present

## 2021-07-12 DIAGNOSIS — N289 Disorder of kidney and ureter, unspecified: Secondary | ICD-10-CM | POA: Diagnosis not present

## 2021-07-12 DIAGNOSIS — G43909 Migraine, unspecified, not intractable, without status migrainosus: Secondary | ICD-10-CM | POA: Diagnosis not present

## 2021-07-12 DIAGNOSIS — R202 Paresthesia of skin: Secondary | ICD-10-CM | POA: Diagnosis not present

## 2021-07-12 DIAGNOSIS — E785 Hyperlipidemia, unspecified: Secondary | ICD-10-CM | POA: Diagnosis not present

## 2021-07-12 DIAGNOSIS — N926 Irregular menstruation, unspecified: Secondary | ICD-10-CM | POA: Diagnosis not present

## 2021-07-12 DIAGNOSIS — R1013 Epigastric pain: Secondary | ICD-10-CM | POA: Diagnosis not present

## 2021-07-12 DIAGNOSIS — E559 Vitamin D deficiency, unspecified: Secondary | ICD-10-CM | POA: Diagnosis not present

## 2021-07-12 DIAGNOSIS — R7303 Prediabetes: Secondary | ICD-10-CM | POA: Diagnosis not present

## 2021-08-23 DIAGNOSIS — G43909 Migraine, unspecified, not intractable, without status migrainosus: Secondary | ICD-10-CM | POA: Diagnosis not present

## 2021-08-23 DIAGNOSIS — N946 Dysmenorrhea, unspecified: Secondary | ICD-10-CM | POA: Diagnosis not present

## 2021-08-23 DIAGNOSIS — N926 Irregular menstruation, unspecified: Secondary | ICD-10-CM | POA: Diagnosis not present

## 2021-08-23 DIAGNOSIS — R7303 Prediabetes: Secondary | ICD-10-CM | POA: Diagnosis not present

## 2021-08-23 DIAGNOSIS — R1013 Epigastric pain: Secondary | ICD-10-CM | POA: Diagnosis not present

## 2021-08-23 DIAGNOSIS — R202 Paresthesia of skin: Secondary | ICD-10-CM | POA: Diagnosis not present

## 2021-08-23 DIAGNOSIS — E785 Hyperlipidemia, unspecified: Secondary | ICD-10-CM | POA: Diagnosis not present

## 2021-08-23 DIAGNOSIS — N289 Disorder of kidney and ureter, unspecified: Secondary | ICD-10-CM | POA: Diagnosis not present

## 2021-08-23 DIAGNOSIS — E559 Vitamin D deficiency, unspecified: Secondary | ICD-10-CM | POA: Diagnosis not present

## 2021-08-23 DIAGNOSIS — F5104 Psychophysiologic insomnia: Secondary | ICD-10-CM | POA: Diagnosis not present

## 2021-09-02 DIAGNOSIS — N946 Dysmenorrhea, unspecified: Secondary | ICD-10-CM | POA: Diagnosis not present

## 2021-09-02 DIAGNOSIS — E559 Vitamin D deficiency, unspecified: Secondary | ICD-10-CM | POA: Diagnosis not present

## 2021-09-02 DIAGNOSIS — G43909 Migraine, unspecified, not intractable, without status migrainosus: Secondary | ICD-10-CM | POA: Diagnosis not present

## 2021-09-02 DIAGNOSIS — F5104 Psychophysiologic insomnia: Secondary | ICD-10-CM | POA: Diagnosis not present

## 2021-09-02 DIAGNOSIS — R1013 Epigastric pain: Secondary | ICD-10-CM | POA: Diagnosis not present

## 2021-09-02 DIAGNOSIS — N289 Disorder of kidney and ureter, unspecified: Secondary | ICD-10-CM | POA: Diagnosis not present

## 2021-09-02 DIAGNOSIS — N926 Irregular menstruation, unspecified: Secondary | ICD-10-CM | POA: Diagnosis not present

## 2021-09-02 DIAGNOSIS — E785 Hyperlipidemia, unspecified: Secondary | ICD-10-CM | POA: Diagnosis not present

## 2021-09-02 DIAGNOSIS — R202 Paresthesia of skin: Secondary | ICD-10-CM | POA: Diagnosis not present

## 2021-09-02 DIAGNOSIS — R7303 Prediabetes: Secondary | ICD-10-CM | POA: Diagnosis not present

## 2021-09-10 DIAGNOSIS — R1031 Right lower quadrant pain: Secondary | ICD-10-CM | POA: Diagnosis not present

## 2021-10-21 DIAGNOSIS — N946 Dysmenorrhea, unspecified: Secondary | ICD-10-CM | POA: Diagnosis not present

## 2021-10-21 DIAGNOSIS — N926 Irregular menstruation, unspecified: Secondary | ICD-10-CM | POA: Diagnosis not present

## 2021-10-21 DIAGNOSIS — R202 Paresthesia of skin: Secondary | ICD-10-CM | POA: Diagnosis not present

## 2021-10-21 DIAGNOSIS — G43909 Migraine, unspecified, not intractable, without status migrainosus: Secondary | ICD-10-CM | POA: Diagnosis not present

## 2021-10-21 DIAGNOSIS — E559 Vitamin D deficiency, unspecified: Secondary | ICD-10-CM | POA: Diagnosis not present

## 2021-10-21 DIAGNOSIS — F5104 Psychophysiologic insomnia: Secondary | ICD-10-CM | POA: Diagnosis not present

## 2021-10-21 DIAGNOSIS — N289 Disorder of kidney and ureter, unspecified: Secondary | ICD-10-CM | POA: Diagnosis not present

## 2021-10-21 DIAGNOSIS — R1013 Epigastric pain: Secondary | ICD-10-CM | POA: Diagnosis not present

## 2021-10-21 DIAGNOSIS — R7303 Prediabetes: Secondary | ICD-10-CM | POA: Diagnosis not present

## 2021-10-21 DIAGNOSIS — E785 Hyperlipidemia, unspecified: Secondary | ICD-10-CM | POA: Diagnosis not present

## 2021-12-27 ENCOUNTER — Ambulatory Visit: Payer: Self-pay

## 2021-12-27 ENCOUNTER — Encounter: Payer: Self-pay | Admitting: Emergency Medicine

## 2021-12-27 ENCOUNTER — Ambulatory Visit
Admission: EM | Admit: 2021-12-27 | Discharge: 2021-12-27 | Disposition: A | Discharge status: Home / Self Care | Payer: Medicaid Other

## 2021-12-27 DIAGNOSIS — J302 Other seasonal allergic rhinitis: Secondary | ICD-10-CM

## 2021-12-27 DIAGNOSIS — J329 Chronic sinusitis, unspecified: Secondary | ICD-10-CM

## 2021-12-27 DIAGNOSIS — J310 Chronic rhinitis: Secondary | ICD-10-CM

## 2021-12-27 DIAGNOSIS — G43909 Migraine, unspecified, not intractable, without status migrainosus: Secondary | ICD-10-CM

## 2021-12-27 DIAGNOSIS — G43019 Migraine without aura, intractable, without status migrainosus: Secondary | ICD-10-CM | POA: Diagnosis not present

## 2021-12-27 MED ORDER — CETIRIZINE HCL 10 MG PO TABS: 10.0000 mg | ORAL_TABLET | Freq: Every day | ORAL | 2 refills | Status: DC

## 2021-12-27 MED ORDER — DIPHENHYDRAMINE HCL 50 MG PO CAPS
50.0000 mg | ORAL_CAPSULE | Freq: Once | ORAL | Status: AC
  Administered 2021-12-27: 50 mg via ORAL

## 2021-12-27 MED ORDER — FLUTICASONE PROPIONATE 50 MCG/ACT NA SUSP: 1.0000 | Freq: Every day | NASAL | 1 refills | Status: DC

## 2021-12-27 MED ORDER — IBUPROFEN 400 MG PO TABS: 400.0000 mg | ORAL_TABLET | Freq: Three times a day (TID) | ORAL | 0 refills | Status: AC | PRN

## 2021-12-27 MED ORDER — KETOROLAC TROMETHAMINE 60 MG/2ML IM SOLN
60.0000 mg | Freq: Once | INTRAMUSCULAR | Status: AC
  Administered 2021-12-27: 60 mg via INTRAMUSCULAR

## 2021-12-27 MED ORDER — OLOPATADINE HCL 0.2 % OP SOLN
1.0000 [drp] | Freq: Every day | OPHTHALMIC | 2 refills | Status: DC
Start: 2021-12-27 — End: 2022-04-25

## 2021-12-27 MED ORDER — SUMATRIPTAN SUCCINATE 25 MG PO TABS: ORAL_TABLET | ORAL | 2 refills | Status: AC

## 2021-12-27 MED ORDER — SUMATRIPTAN SUCCINATE 6 MG/0.5ML ~~LOC~~ SOLN
6.0000 mg | Freq: Once | SUBCUTANEOUS | Status: AC
  Administered 2021-12-27: 6 mg via SUBCUTANEOUS

## 2021-12-27 NOTE — ED Triage Notes (Addendum)
Patient c/o Migraine that started yesterday.  ? ?Patient endorses photosensitivity.  ? ?Patient denies N/V.  ? ?Patient has history of migraines.  ? ?Patient states that she ran out of her migraine medication called sumatriptan 25 mg.  ?Patient has taken Excedrin with no relief of symptoms.  ?

## 2021-12-27 NOTE — Discharge Instructions (Signed)
To treat your migraine acutely today, you received an injection of ketorolac, an injection of Imitrex and 50 mg of Benadryl. ? ?I have renewed your prescription for sumatriptan hand.  At the onset of headache, please take one sumatriptan hand with ibuprofen 400 mg (prescribed) and a spoonful of peanut butter.  In one hour, if your headache is not completely resolved, please take a second sumatriptan tablet. ? ?My physical exam findings are concerning for exacerbation of your underlying allergies which is possibly triggering your migraines in addition to your recently starting a new job and having increased stress at home.  I hope that your mom feels better and that your training at work smoothies out very soon.   ? ?It is important that you begin your allergy regimen now and are consistent with taking allergy medications exactly as prescribed.  Allergy medications are preventative and therefore only work well when they are taken daily, not "as needed". ?  ?Please see the list below for recommended medications, dosages and frequencies to provide relief of your current symptoms:   ?  ?Zyrtec (cetirizine): This is an excellent second-generation antihistamine that helps to reduce respiratory inflammatory response to environmental allergens.  In some patients, this medication can cause daytime sleepiness so I recommend that you take one tablet daily at bedtime.  I recommend that you consider taking this for one to two weeks, continue it throughout the rest of spring (end of June) if you are tolerating it well. ?  ?Flonase (fluticasone): This is a steroid nasal spray that you use once daily, one spray in each nare.  This medication is not absorbed into the body, it is merely a topical treatment to create a barrier against environmental allergens.  This medication does not work well if you decide to use it only used as you feel you need to, it works best used on a daily basis.  After three to five days of use, you will  notice significant reduction of the inflammation and mucus production that is currently being caused by exposure to allergens, whether seasonal or environmental.  The most common side effect of this medication is nosebleeds which is not common.  If you do you experience a nosebleed, please discontinue use for one week, then feel free to resume.   ?  ?Pataday (olopatadine): This is an antihistamine eyedrop that can be used once daily to help relieve dry eyes, itchy eyes and red eyes.  This antihistamine drop not only works for allergic conjunctivitis but is also very helpful with viral conjunctivitis.  Please do not use this drop more than once a day, for best relief please use this in the morning.  If you find that this medication is too drying, you can switch to every other day or every three days and so on until you find the perfect balance for you. ? ?All three of these medications are safe to use along with other natural, homeopathic allergy treatments. ?  ?Not taking your allergy medications as prescribed can increase your risk of more frequent episodes of migraine, upper respiratory infections, lower respiratory disorders, skin reactions, and eye irritations that may or may not require the use of antibiotics and steroids and can result in loss of time at work, celebrations with family and friends as well as missed social opportunities. ?  ?If you find that you have not had significant relief of your symptoms in the next seven to ten days, please follow-up with your primary care provider or return  here to urgent care for repeat evaluation and further recommendations. ?  ?Thank you for visiting urgent care today.  We appreciate the opportunity to participate in your care. ? ?

## 2021-12-27 NOTE — ED Provider Notes (Signed)
?[facility] ? ? ? ?CSN: [id number] ?Arrival date & time: 12/27/21  [id number] ?  ? ?HISTORY  ? ?Chief Complaint  ?Patient presents with  ? Migraine  ? Appointment  ? ?HPI ?Brittney Watkins is a 41 y.o. female. Patient presents to urgent care today complaining of a migraine that began yesterday.  Patient states the migraine is accompanied by photosensitivity but denies aura, nausea, vomiting.  Patient states she has a history of migraine disorder, has previously been prescribed sumatriptan 25 mg which she has run out of, patient is requesting refill.  Patient states she tried taking Excedrin which did not help.  Patient states that her headache is local to her forehead above both eyes, also feels like pressure.  Patient denies history of allergies but states that springtime usually causes her to get a runny nose but has never taken allergy medications for this ? ?The history is provided by the patient.  ?Past Medical History:  ?Diagnosis Date  ? Blurred vision   ? Migraine   ? UTI (urinary tract infection)   ? ?Patient Active Problem List  ? Diagnosis Date Noted  ? 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 Migraine 09/08/2014  ? Headache 09/08/2014  ? Neck pain 09/08/2014  ? ?Past Surgical History:  ?Procedure Laterality Date  ? CHOLECYSTECTOMY  2007  ? TUBAL LIGATION  2006  ? ?OB History   ? ? Gravida  ?2  ? Para  ?2  ? Term  ?2  ? Preterm  ?0  ? AB  ?0  ? Living  ?2  ?  ? ? SAB  ?0  ? IAB  ?0  ? Ectopic  ?0  ? Multiple  ?0  ? Live Births  ?2  ?   ?  ?  ? ?Home Medications   ? ?Prior to Admission medications   ?Medication Sig Start Date End Date Taking? Authorizing Provider  ?ferrous sulfate 325 (65 FE) MG EC tablet Take 325 mg by mouth 3 (three) times daily with meals.   Yes [provider]  ?Oxymetazoline HCl (MUCINEX NASAL SPRAY FULL FORCE NA) Place into the nose.   Yes [provider]  ?TRI-LO-SPRINTEC 0.18/0.215/0.25 MG-25 MCG tab Take 1 tablet by mouth daily. 05/05/20  Yes [provider]  ?Cetirizine HCl 10 MG CAPS Take 1 capsule (10 mg total) by mouth daily for 10 days. 05/03/21 05/13/21  Wieters, Hallie C, PA-C  ?fluticasone (FLONASE) 50 MCG/ACT nasal spray Place 1-2 sprays into both nostrils daily. 05/03/21   Wieters, Junius CreamerHallie C, PA-C  ?SUMAtriptan (IMITREX) 25 MG tablet Take 25 mg by mouth once. 01/28/20   [provider]  ?tiZANidine (ZANAFLEX) 2 MG tablet Take 1-2 tablets (2-4 mg total) by mouth every 6 (six) hours as needed for muscle spasms. 05/03/21   Wieters, Hallie C, PA-C  ?dicyclomine (BENTYL) 20 MG tablet Take 1 tablet (20 mg total) by mouth 4 (four) times daily -  before meals and at bedtime. ?Patient not taking: Reported on 03/27/2019 03/24/19 07/19/19  Wieters, Fran LowesHallie C, PA-C  ?rizatriptan (MAXALT-MLT) 5 MG disintegrating tablet DIS 1 T ON THE TONGUE QD ?Patient taking differently: Take 5 mg by mouth as needed for migraine.  09/17/18 07/19/19  Mardella LaymanHagler, Brian, MD  ? ? ?Family History ?Family History  ?Problem Relation Age of Onset  ? Heart attack Father   ? Diabetes Maternal Grandmother   ?     some uncles as well   ? High blood pressure Maternal Grandmother   ?  some uncles as well  ? Migraines Neg Hx   ? ?Social History ?Social History  ? ?Tobacco Use  ? Smoking status: Never  ? Smokeless tobacco: Never  ?Vaping Use  ? Vaping Use: Never used  ?Substance Use Topics  ? Alcohol use: No  ?  Alcohol/week: 0.0 standard drinks  ? Drug use: No  ? ?Allergies   ?Nyquil multi-symptom [pseudoeph-doxylamine-dm-apap] and Shellfish allergy ? ?Review of Systems ?Review of Systems ?Pertinent findings noted in history of present illness.  ? ?Physical Exam ?Triage Vital Signs ?ED Triage Vitals  ?Enc Vitals Group  ?   BP 07/15/21 0827 (!) 147/82  ?   Pulse Rate 07/15/21 0827 72  ?   Resp 07/15/21 0827 18  ?   Temp 07/15/21 0827 98.3 ?F (36.8 ?C)  ?   Temp Source 07/15/21 0827 Oral  ?   SpO2 07/15/21 0827 98 %  ?   Weight --   ?   Height --   ?   Head Circumference --   ?   Peak Flow --    ?   Pain Score 07/15/21 0826 5  ?   Pain Loc --   ?   Pain Edu? --   ?   Excl. in GC? --   ?No data found. ? ?Updated Vital Signs ?BP 114/77 (BP Location: Right Arm)   Pulse 76   Temp 97.8 ?F (36.6 ?C) (Oral)   Resp 16   Ht 5\' 2"  (1.575 m)   Wt 145 lb (65.8 kg)   LMP 12/15/2021 (Exact Date)   SpO2 96%   BMI 26.52 kg/m?  ? ?Physical Exam ?Vitals and nursing note reviewed.  ?Constitutional:   ?   General: She is not in acute distress. ?   Appearance: Normal appearance. She is not ill-appearing.  ?HENT:  ?   Head: Normocephalic and atraumatic.  ?   Salivary Glands: Right salivary gland is not diffusely enlarged or tender. Left salivary gland is not diffusely enlarged or tender.  ?   Right Ear: Ear canal and external ear normal. No drainage. A middle ear effusion is present. There is no impacted cerumen. Tympanic membrane is bulging. Tympanic membrane is not injected or erythematous.  ?   Left Ear: Ear canal and external ear normal. No drainage. A middle ear effusion is present. There is no impacted cerumen. Tympanic membrane is bulging. Tympanic membrane is not injected or erythematous.  ?   Ears:  ?   Comments: Bilateral EACs normal, both TMs bulging with clear fluid ?   Nose: Rhinorrhea present. No nasal deformity, septal deviation, signs of injury, nasal tenderness, mucosal edema or congestion. Rhinorrhea is clear.  ?   Right Nostril: Occlusion present. No foreign body, epistaxis or septal hematoma.  ?   Left Nostril: Occlusion present. No foreign body, epistaxis or septal hematoma.  ?   Right Turbinates: Enlarged, swollen and pale.  ?   Left Turbinates: Enlarged, swollen and pale.  ?   Right Sinus: No maxillary sinus tenderness or frontal sinus tenderness.  ?   Left Sinus: No maxillary sinus tenderness or frontal sinus tenderness.  ?   Mouth/Throat:  ?   Lips: Pink. No lesions.  ?   Mouth: Mucous membranes are moist. No oral lesions.  ?   Pharynx: Oropharynx is clear. Uvula midline. No posterior  oropharyngeal erythema or uvula swelling.  ?   Tonsils: No tonsillar exudate. 0 on the right. 0 on the left.  ?  Comments: Postnasal drip ?Eyes:  ?   General: Lids are normal.     ?   Right eye: No discharge.     ?   Left eye: No discharge.  ?   Extraocular Movements: Extraocular movements intact.  ?   Conjunctiva/sclera: Conjunctivae normal.  ?   Right eye: Right conjunctiva is not injected.  ?   Left eye: Left conjunctiva is not injected.  ?Neck:  ?   Trachea: Trachea and phonation normal.  ?Cardiovascular:  ?   Rate and Rhythm: Normal rate and regular rhythm.  ?   Pulses: Normal pulses.  ?   Heart sounds: Normal heart sounds. No murmur heard. ?  No friction rub. No gallop.  ?Pulmonary:  ?   Effort: Pulmonary effort is normal. No accessory muscle usage, prolonged expiration or respiratory distress.  ?   Breath sounds: Normal breath sounds. No stridor, decreased air movement or transmitted upper airway sounds. No decreased breath sounds, wheezing, rhonchi or rales.  ?Chest:  ?   Chest wall: No tenderness.  ?Musculoskeletal:     ?   General: Normal range of motion.  ?   Cervical back: Normal range of motion and neck supple. Normal range of motion.  ?Lymphadenopathy:  ?   Cervical: No cervical adenopathy.  ?Skin: ?   General: Skin is warm and dry.  ?   Findings: No erythema or rash.  ?Neurological:  ?   General: No focal deficit present.  ?   Mental Status: She is alert and oriented to person, place, and time.  ?Psychiatric:     ?   Mood and Affect: Mood normal.     ?   Behavior: Behavior normal.  ? ? ?Visual Acuity ?Right Eye Distance:   ?Left Eye Distance:   ?Bilateral Distance:   ? ?Right Eye Near:   ?Left Eye Near:    ?Bilateral Near:    ? ?UC Couse / Diagnostics / Procedures:  ?  ?EKG ? ?Radiology ?No results found. ? ?Procedures ?Procedures (including critical care time) ? ?UC Diagnoses / Final Clinical Impressions(s)   ?I have reviewed the triage vital signs and the nursing notes. ? ?Pertinent labs & imaging  results that were available during my care of the patient were reviewed by me and considered in my medical decision making (see chart for details).   ? ?Final diagnoses:  ?Acute migraine  ?Seasonal allergic rhinitis, unspecif

## 2022-01-10 DIAGNOSIS — N946 Dysmenorrhea, unspecified: Secondary | ICD-10-CM | POA: Diagnosis not present

## 2022-01-10 DIAGNOSIS — E559 Vitamin D deficiency, unspecified: Secondary | ICD-10-CM | POA: Diagnosis not present

## 2022-01-10 DIAGNOSIS — N926 Irregular menstruation, unspecified: Secondary | ICD-10-CM | POA: Diagnosis not present

## 2022-01-10 DIAGNOSIS — E785 Hyperlipidemia, unspecified: Secondary | ICD-10-CM | POA: Diagnosis not present

## 2022-01-10 DIAGNOSIS — R202 Paresthesia of skin: Secondary | ICD-10-CM | POA: Diagnosis not present

## 2022-01-10 DIAGNOSIS — F5104 Psychophysiologic insomnia: Secondary | ICD-10-CM | POA: Diagnosis not present

## 2022-01-10 DIAGNOSIS — N289 Disorder of kidney and ureter, unspecified: Secondary | ICD-10-CM | POA: Diagnosis not present

## 2022-01-10 DIAGNOSIS — R7303 Prediabetes: Secondary | ICD-10-CM | POA: Diagnosis not present

## 2022-01-10 DIAGNOSIS — G43909 Migraine, unspecified, not intractable, without status migrainosus: Secondary | ICD-10-CM | POA: Diagnosis not present

## 2022-01-10 DIAGNOSIS — R1013 Epigastric pain: Secondary | ICD-10-CM | POA: Diagnosis not present

## 2022-01-26 ENCOUNTER — Ambulatory Visit: Payer: Self-pay

## 2022-04-01 ENCOUNTER — Ambulatory Visit: Payer: Self-pay

## 2022-04-02 ENCOUNTER — Ambulatory Visit: Payer: Self-pay

## 2022-04-05 ENCOUNTER — Telehealth: Payer: Self-pay | Admitting: Internal Medicine

## 2022-04-05 NOTE — Telephone Encounter (Signed)
Scheduled appt per 7/18 referral. Pt is aware of appt date and time. Pt is aware to arrive 15 mins prior to appt time and to bring and updated insurance card. Pt is aware of appt location.   

## 2022-04-21 ENCOUNTER — Other Ambulatory Visit: Payer: Self-pay

## 2022-04-21 DIAGNOSIS — D500 Iron deficiency anemia secondary to blood loss (chronic): Secondary | ICD-10-CM

## 2022-04-21 DIAGNOSIS — D649 Anemia, unspecified: Secondary | ICD-10-CM

## 2022-04-21 IMAGING — DX DG ABDOMEN 1V
1 series · 1 of 1 positions shown · non-contrast
Comparison: None.

CLINICAL DATA: 38-year-old female with abdominal pain.

EXAM:
ABDOMEN - 1 VIEW

[abdomen kub]
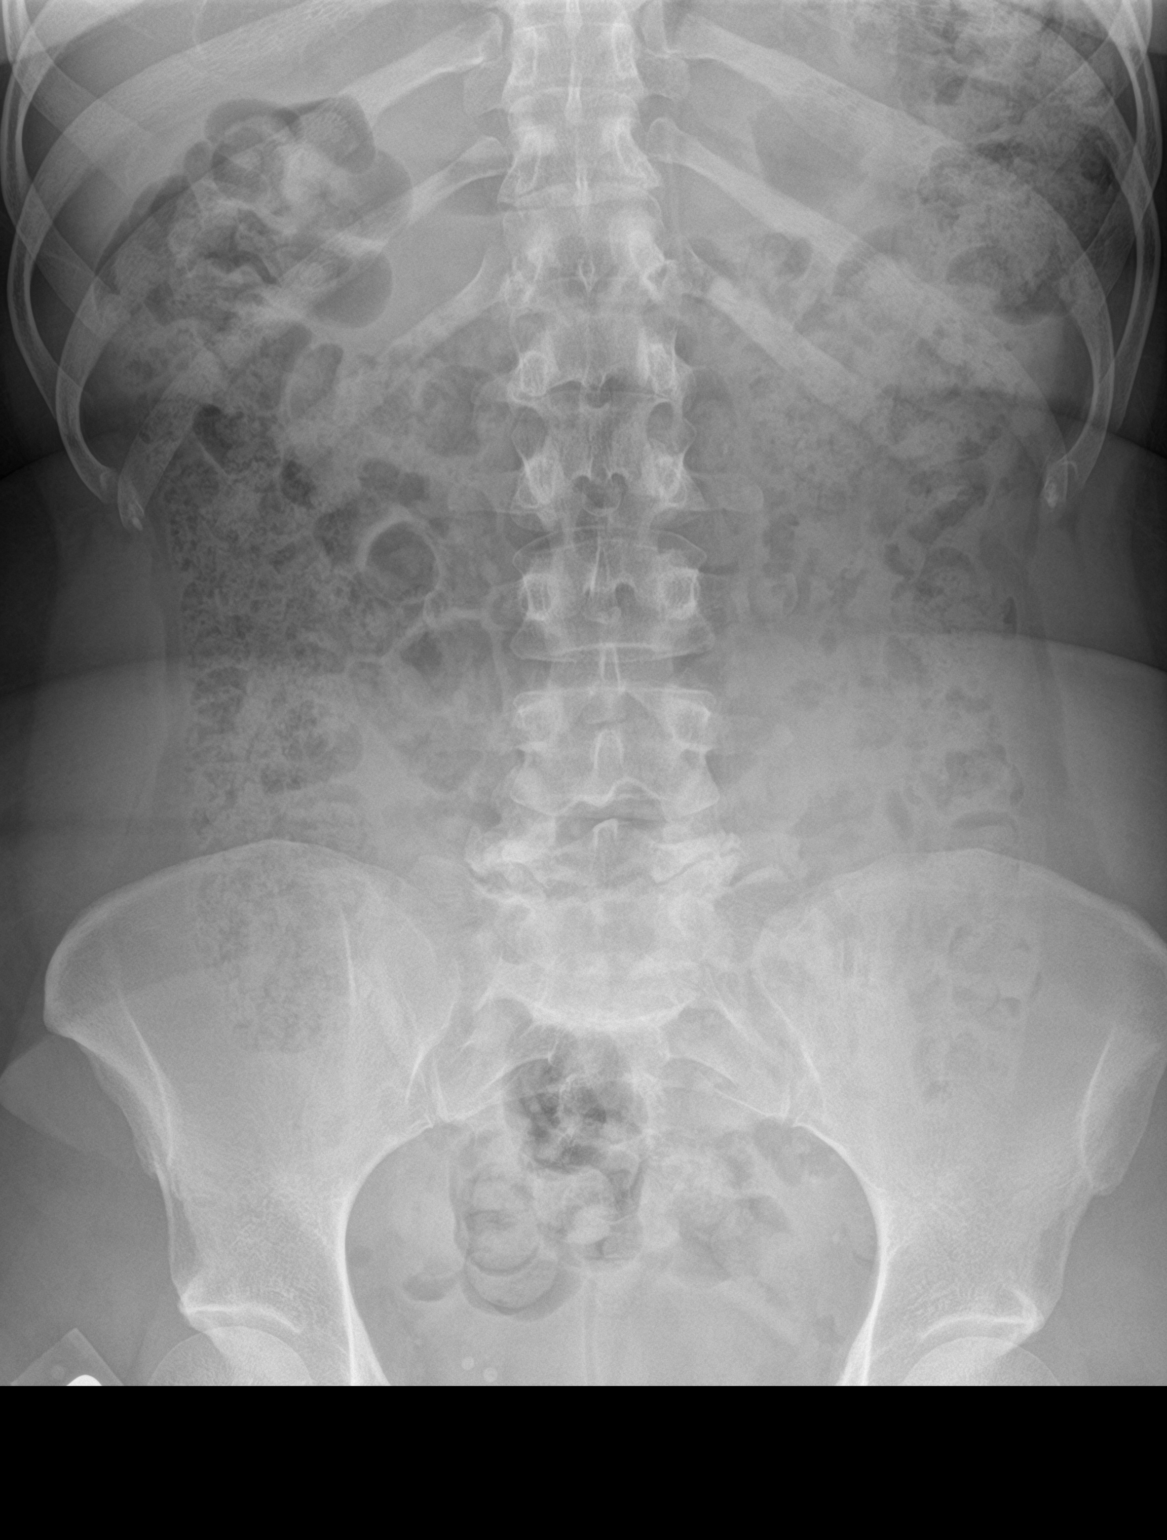

[1 of 1 positions shown; findings below may reference images not displayed]

FINDINGS: There is large amount of stool throughout the colon. No bowel
dilatation or evidence of obstruction. No free air or radiopaque
calculi. Probable bilateral L5 pars defects. No acute osseous
pathology.
IMPRESSION: Constipation. No bowel obstruction.

## 2022-04-25 ENCOUNTER — Encounter: Payer: Self-pay | Admitting: Internal Medicine

## 2022-04-25 ENCOUNTER — Inpatient Hospital Stay: Payer: Medicaid Other | Attending: Internal Medicine | Admitting: Internal Medicine

## 2022-04-25 ENCOUNTER — Inpatient Hospital Stay: Payer: Medicaid Other

## 2022-04-25 DIAGNOSIS — R5383 Other fatigue: Secondary | ICD-10-CM | POA: Insufficient documentation

## 2022-04-25 DIAGNOSIS — N946 Dysmenorrhea, unspecified: Secondary | ICD-10-CM | POA: Diagnosis not present

## 2022-04-25 DIAGNOSIS — Z87440 Personal history of urinary (tract) infections: Secondary | ICD-10-CM | POA: Insufficient documentation

## 2022-04-25 DIAGNOSIS — N939 Abnormal uterine and vaginal bleeding, unspecified: Secondary | ICD-10-CM | POA: Insufficient documentation

## 2022-04-25 DIAGNOSIS — E785 Hyperlipidemia, unspecified: Secondary | ICD-10-CM | POA: Diagnosis not present

## 2022-04-25 DIAGNOSIS — R110 Nausea: Secondary | ICD-10-CM | POA: Diagnosis not present

## 2022-04-25 DIAGNOSIS — E559 Vitamin D deficiency, unspecified: Secondary | ICD-10-CM | POA: Diagnosis not present

## 2022-04-25 DIAGNOSIS — D500 Iron deficiency anemia secondary to blood loss (chronic): Secondary | ICD-10-CM | POA: Diagnosis present

## 2022-04-25 DIAGNOSIS — R42 Dizziness and giddiness: Secondary | ICD-10-CM | POA: Insufficient documentation

## 2022-04-25 LAB — CMP (CANCER CENTER ONLY)
ALT: 16 U/L (ref 0–44)
AST: 21 U/L (ref 15–41)
Albumin: 4.1 g/dL (ref 3.5–5.0)
Alkaline Phosphatase: 49 U/L (ref 38–126)
Anion gap: 3 — ABNORMAL LOW (ref 5–15)
BUN: 8 mg/dL (ref 6–20)
CO2: 26 mmol/L (ref 22–32)
Calcium: 9.1 mg/dL (ref 8.9–10.3)
Chloride: 109 mmol/L (ref 98–111)
Creatinine: 0.95 mg/dL (ref 0.44–1.00)
GFR, Estimated: >60 mL/min (ref >60)
Glucose, Bld: 97 mg/dL (ref 70–99)
Potassium: 4 mmol/L (ref 3.5–5.1)
Sodium: 138 mmol/L (ref 135–145)
Total Bilirubin: 0.5 mg/dL (ref 0.3–1.2)
Total Protein: 7.2 g/dL (ref 6.5–8.1)

## 2022-04-25 LAB — IRON AND IRON BINDING CAPACITY (CC-WL,HP ONLY)
Iron: 92 ug/dL (ref 28–170)
Saturation Ratios: 22 % (ref 10.4–31.8)
TIBC: 421 ug/dL (ref 250–450)
UIBC: 329 ug/dL (ref 148–442)

## 2022-04-25 LAB — CBC WITH DIFFERENTIAL (CANCER CENTER ONLY)
Abs Immature Granulocytes: 0.01 10*3/uL (ref 0.00–0.07)
Basophils Absolute: 0 10*3/uL (ref 0.0–0.1)
Basophils Relative: 0 %
Eosinophils Absolute: 0 10*3/uL (ref 0.0–0.5)
Eosinophils Relative: 1 %
HCT: 33.9 % — ABNORMAL LOW (ref 36.0–46.0)
Hemoglobin: 11.5 g/dL — ABNORMAL LOW (ref 12.0–15.0)
Immature Granulocytes: 0 %
Lymphocytes Relative: 41 %
Lymphs Abs: 2 10*3/uL (ref 0.7–4.0)
MCH: 28 pg (ref 26.0–34.0)
MCHC: 33.9 g/dL (ref 30.0–36.0)
MCV: 82.7 fL (ref 80.0–100.0)
Monocytes Absolute: 0.3 10*3/uL (ref 0.1–1.0)
Monocytes Relative: 7 %
Neutro Abs: 2.4 10*3/uL (ref 1.7–7.7)
Neutrophils Relative %: 51 %
Platelet Count: 161 10*3/uL (ref 150–400)
RBC: 4.1 MIL/uL (ref 3.87–5.11)
RDW: 14.6 % (ref 11.5–15.5)
WBC Count: 4.8 10*3/uL (ref 4.0–10.5)
nRBC: 0 % (ref 0.0–0.2)

## 2022-04-25 LAB — SAMPLE TO BLOOD BANK

## 2022-04-25 LAB — FOLATE: Folate: 13.8 ng/mL (ref >5.9)

## 2022-04-25 LAB — VITAMIN B12: Vitamin B-12: 293 pg/mL (ref 180–914)

## 2022-04-25 LAB — ABO/RH: ABO/RH(D): O POS

## 2022-04-25 LAB — FERRITIN: Ferritin: 8 ng/mL — ABNORMAL LOW (ref 11–307)

## 2022-04-25 NOTE — Progress Notes (Signed)
[facility] [phone]   [phone]  CONSULT NOTE  REFERRING PHYSICIAN: Norva Riffle, PA  REASON FOR CONSULTATION:  41 years old African-American female with iron deficiency anemia  HPI Brittney Watkins is a 41 y.o. female with past medical history significant for migraine, urinary tract infection, vitamin D deficiency, dysmenorrhea, dyslipidemia.  The patient mentioned that she knows about history of anemia when she was pregnant with her son 17 years ago but she has been doing fine since that time.  Over the last few months she is switching to her oral contraceptive pills and she started having significant bleeding that lasted for over 5 weeks with 3 heavy weeks.  She was seen by her primary care provider and blood work at that time showed hemoglobin of 11.6 and hematocrit 35.7 with normal white blood count as well as platelet count.  Her serum iron was normal at 109 but ferritin level was down to 9.  The patient started treatment with liquid iron because she could not tolerate the oral iron tablets she continues to have low ferritin level and she was referred to me today for evaluation and recommendation regarding her condition. When seen today she continues to complain of fatigue and lightheadedness with occasional nausea.  She denied having any vomiting, diarrhea, constipation or melena.  She has no chest pain, shortness of breath, cough or hemoptysis.  She has no headache or visual changes.  She has craving for ice. Family history significant for mother with COPD.  Maternal grandmother had diabetes and hypertension and she had a maternal ankle with lung cancer. The patient is married and has 2 children aged 13 and 62.  She works for [employer].  She has no history for smoking, alcohol or drug abuse.  HPI  Past Medical History:  Diagnosis Date   Blurred vision    Migraine    UTI (urinary tract infection)     Past Surgical History:  Procedure  Laterality Date   CHOLECYSTECTOMY  2007   TUBAL LIGATION  2006    Family History  Problem Relation Age of Onset   Heart attack Father    Diabetes Maternal Grandmother        some uncles as well    High blood pressure Maternal Grandmother        some uncles as well   Migraines Neg Hx     Social History Social History   Tobacco Use   Smoking status: Never   Smokeless tobacco: Never  Vaping Use   Vaping Use: Never used  Substance Use Topics   Alcohol use: No    Alcohol/week: 0.0 standard drinks of alcohol   Drug use: No    Allergies  Allergen Reactions   Nyquil Multi-Symptom [Pseudoeph-Doxylamine-Dm-Apap] Swelling and Rash   Shellfish Allergy Rash    Current Outpatient Medications  Medication Sig Dispense Refill   cetirizine (ZYRTEC ALLERGY) 10 MG tablet Take 1 tablet (10 mg total) by mouth at bedtime. 30 tablet 2   ferrous sulfate 325 (65 FE) MG EC tablet Take 325 mg by mouth 3 (three) times daily with meals.     fluticasone (FLONASE) 50 MCG/ACT nasal spray Place 1 spray into both nostrils daily. Begin by using 2 sprays in each nare daily for 3 to 5 days, then decrease to 1 spray in each nare daily. 48 mL 1   SUMAtriptan (IMITREX) 25 MG tablet At onset of headache, take 1 sumatriptan tablet with ibuprofen 400 mg and  1 tablespoon of peanut butter.  Take 2nd sumatriptan tablet in 1 hour if headache not completely resolved.  Do not exceed sumatriptan 2 tablets (50 mg) in 24 hours. 30 tablet 2   TRI-LO-SPRINTEC 0.18/0.215/0.25 MG-25 MCG tab Take 1 tablet by mouth daily.     No current facility-administered medications for this visit.    Review of Systems  Constitutional: positive for fatigue Eyes: negative Ears, nose, mouth, throat, and face: negative Respiratory: negative Cardiovascular: negative Gastrointestinal: negative Genitourinary:negative Integument/breast: negative Hematologic/lymphatic: negative Musculoskeletal:negative Neurological:  negative Behavioral/Psych: negative Endocrine: negative Allergic/Immunologic: negative  Physical Exam  [id number], healthy, no distress, well nourished, and well developed SKIN: skin color, texture, turgor are normal, no rashes or significant lesions HEAD: Normocephalic, No masses, lesions, tenderness or abnormalities EYES: normal, PERRLA, Conjunctiva are pink and non-injected EARS: External ears normal, Canals clear OROPHARYNX:no exudate, no erythema, and lips, buccal mucosa, and tongue normal  NECK: supple, no adenopathy, no JVD LYMPH:  no palpable lymphadenopathy, no hepatosplenomegaly BREAST:not examined LUNGS: clear to auscultation , and palpation HEART: regular rate & rhythm, no murmurs, and no gallops ABDOMEN:abdomen soft, non-tender, normal bowel sounds, and no masses or organomegaly BACK: Back symmetric, no curvature., No CVA tenderness EXTREMITIES:no joint deformities, effusion, or inflammation, no edema  NEURO: alert & oriented x 3 with fluent speech, no focal motor/sensory deficits  PERFORMANCE STATUS: ECOG 1  LABORATORY DATA: Lab Results  Component Value Date   WBC 4.8 04/25/2022   HGB 11.5 (L) 04/25/2022   HCT 33.9 (L) 04/25/2022   MCV 82.7 04/25/2022   PLT 161 04/25/2022      Chemistry      Component Value Date/Time   NA 137 03/27/2019 0512   NA 139 11/26/2014 1557   K 4.0 03/27/2019 0512   CL 103 03/27/2019 0512   CO2 23 03/27/2019 0512   BUN 7 03/27/2019 0512   BUN 12 11/26/2014 1557   CREATININE 0.99 03/27/2019 0512      Component Value Date/Time   CALCIUM 9.3 03/27/2019 0512   ALKPHOS 54 03/27/2019 0512   AST 23 03/27/2019 0512   ALT 14 03/27/2019 0512   BILITOT 0.7 03/27/2019 0512   BILITOT 0.2 11/26/2014 1557       RADIOGRAPHIC STUDIES: No results found.  ASSESSMENT: This is a very pleasant 41 years old African-American female with iron deficiency anemia secondary to menorrhagia with significant blood loss over the last 5 weeks.  The  patient has intolerance to the oral iron tablets.   PLAN: I had a lengthy discussion with the patient today about her current condition and treatment options. I ordered several studies for evaluation of her anemia including repeat CBC that showed hemoglobin of 11.5 and hematocrit 33.9%.  She has normal vitamin B12 and serum folate.  Iron study showed normal serum iron of 92 with iron saturation of 22% but the ferritin level is still pending.  She also has pending serum protein electrophoresis. The patient has intolerance to the oral iron tablets. I will arrange for her to receive iron infusion with Venofer 300 Mg IV weekly for 3 weeks. I will see her back for follow-up visit in 3 months for evaluation repeat CBC, iron study and ferritin. The patient was advised to call immediately if she has any other concerning symptoms in the interval.  The patient voices understanding of current disease status and treatment options and is in agreement with the current care plan.  All questions were answered. The patient knows to call the clinic  with any problems, questions or concerns. We can certainly see the patient much sooner if necessary.  Thank you so much for allowing me to participate in the care of Velda Shell. I will continue to follow up the patient with you and assist in her care.  The total time spent in the appointment was 60 minutes.  Disclaimer: This note was dictated with voice recognition software. Similar sounding words can inadvertently be transcribed and may not be corrected upon review.   Lajuana Matte April 25, 2022, 12:02 PM

## 2022-04-26 ENCOUNTER — Other Ambulatory Visit: Payer: Self-pay | Admitting: Pharmacy Technician

## 2022-04-26 ENCOUNTER — Telehealth: Payer: Self-pay | Admitting: Pharmacy Technician

## 2022-04-26 NOTE — Telephone Encounter (Signed)
Auth Submission: no auth needed Payer: [place] medicaid [employer] Medication & CPT/J Code(s) submitted: Venofer (Iron Sucrose) J1756 Route of submission (phone, fax, portal): PORTA Auth type: Buy/Bill Units/visits requested: 300MG  Reference number:  Approval from: 04/26/22 to 07/27/22

## 2022-04-27 LAB — PROTEIN ELECTROPHORESIS, SERUM, WITH REFLEX
A/G Ratio: 1.2 (ref 0.7–1.7)
Albumin ELP: 3.5 g/dL (ref 2.9–4.4)
Alpha-1-Globulin: 0.3 g/dL (ref 0.0–0.4)
Alpha-2-Globulin: 0.6 g/dL (ref 0.4–1.0)
Beta Globulin: 0.9 g/dL (ref 0.7–1.3)
Gamma Globulin: 1.2 g/dL (ref 0.4–1.8)
Globulin, Total: 3 g/dL (ref 2.2–3.9)
Total Protein ELP: 6.5 g/dL (ref 6.0–8.5)

## 2022-05-09 ENCOUNTER — Ambulatory Visit (INDEPENDENT_AMBULATORY_CARE_PROVIDER_SITE_OTHER): Payer: Medicaid Other | Admitting: *Deleted

## 2022-05-09 VITALS — BP 111/70 | HR 65 | Temp 98.4°F | Resp 18 | Ht 62.0 in | Wt 145.8 lb

## 2022-05-09 DIAGNOSIS — D500 Iron deficiency anemia secondary to blood loss (chronic): Secondary | ICD-10-CM | POA: Diagnosis not present

## 2022-05-09 MED ORDER — METHYLPREDNISOLONE SODIUM SUCC 125 MG IJ SOLR: 125.0000 mg | Freq: Once | INTRAMUSCULAR | Status: DC | PRN

## 2022-05-09 MED ORDER — EPINEPHRINE 0.3 MG/0.3ML IJ SOAJ: 0.3000 mg | Freq: Once | INTRAMUSCULAR | Status: DC | PRN

## 2022-05-09 MED ORDER — FAMOTIDINE IN NACL 20-0.9 MG/50ML-% IV SOLN: 20.0000 mg | Freq: Once | INTRAVENOUS | Status: DC | PRN

## 2022-05-09 MED ORDER — DIPHENHYDRAMINE HCL 50 MG/ML IJ SOLN: 50.0000 mg | Freq: Once | INTRAMUSCULAR | Status: DC | PRN

## 2022-05-09 MED ORDER — SODIUM CHLORIDE 0.9 % IV SOLN: Freq: Once | INTRAVENOUS | Status: DC | PRN

## 2022-05-09 MED ORDER — SODIUM CHLORIDE 0.9% FLUSH: 3.0000 mL | Freq: Once | INTRAVENOUS | Status: DC | PRN

## 2022-05-09 MED ORDER — ALBUTEROL SULFATE HFA 108 (90 BASE) MCG/ACT IN AERS: 2.0000 | INHALATION_SPRAY | Freq: Once | RESPIRATORY_TRACT | Status: DC | PRN

## 2022-05-09 MED ORDER — ANTICOAGULANT SODIUM CITRATE 4% (200MG/5ML) IV SOLN: 5.0000 mL | Freq: Once | Status: DC | PRN

## 2022-05-09 MED ORDER — SODIUM CHLORIDE 0.9% FLUSH: 10.0000 mL | Freq: Once | INTRAVENOUS | Status: DC | PRN

## 2022-05-09 MED ORDER — HEPARIN SOD (PORK) LOCK FLUSH 100 UNIT/ML IV SOLN: 500.0000 [IU] | Freq: Once | INTRAVENOUS | Status: DC | PRN

## 2022-05-09 MED ORDER — ALTEPLASE 2 MG IJ SOLR: 2.0000 mg | Freq: Once | INTRAMUSCULAR | Status: DC | PRN

## 2022-05-09 MED ORDER — SODIUM CHLORIDE 0.9 % IV SOLN
300.0000 mg | INTRAVENOUS | Status: DC
  Administered 2022-05-09: 300 mg via INTRAVENOUS
  Filled 2022-05-09: qty 15

## 2022-05-09 MED ORDER — HEPARIN SOD (PORK) LOCK FLUSH 100 UNIT/ML IV SOLN: 250.0000 [IU] | Freq: Once | INTRAVENOUS | Status: DC | PRN

## 2022-05-09 NOTE — Progress Notes (Signed)
Diagnosis: Iron Deficiency Anemia  Provider:  Chilton Greathouse MD  Procedure: Infusion  IV Type: Peripheral, IV Location: L Antecubital  Venofer (Iron Sucrose), Dose: 390 mg  Infusion Start Time: 0946 am  Infusion Stop Time: 1117 am  Post Infusion IV Care: Observation period completed and Peripheral IV Discontinued  Discharge: Condition: Good, Destination: Home . AVS provided to patient.   Performed by:  Forrest Moron, RN

## 2022-05-16 ENCOUNTER — Ambulatory Visit (INDEPENDENT_AMBULATORY_CARE_PROVIDER_SITE_OTHER): Payer: Medicaid Other

## 2022-05-16 VITALS — BP 105/69 | HR 54 | Temp 98.3°F | Resp 18 | Ht 62.0 in | Wt 146.2 lb

## 2022-05-16 DIAGNOSIS — D500 Iron deficiency anemia secondary to blood loss (chronic): Secondary | ICD-10-CM | POA: Diagnosis not present

## 2022-05-16 DIAGNOSIS — N920 Excessive and frequent menstruation with regular cycle: Secondary | ICD-10-CM | POA: Diagnosis not present

## 2022-05-16 MED ORDER — SODIUM CHLORIDE 0.9 % IV SOLN
300.0000 mg | INTRAVENOUS | Status: DC
  Administered 2022-05-16: 300 mg via INTRAVENOUS
  Filled 2022-05-16: qty 15

## 2022-05-16 NOTE — Progress Notes (Signed)
Diagnosis: Iron Deficiency Anemia  Provider:  Chilton Greathouse MD  Procedure: Infusion  IV Type: Peripheral, IV Location: R Antecubital  Venofer (Iron Sucrose), Dose: 300 mg  Infusion Start Time: 0935  Infusion Stop Time: 1117  Post Infusion IV Care: Peripheral IV Discontinued  Discharge: Condition: Good, Destination: Home . AVS provided to patient.   Performed by:  Loney Hering, LPN

## 2022-05-23 ENCOUNTER — Ambulatory Visit: Payer: Medicaid Other

## 2022-05-23 MED ORDER — SODIUM CHLORIDE 0.9 % IV SOLN
300.0000 mg | INTRAVENOUS | Status: AC
  Filled 2022-05-23: qty 15

## 2022-05-24 ENCOUNTER — Ambulatory Visit (INDEPENDENT_AMBULATORY_CARE_PROVIDER_SITE_OTHER): Payer: Medicaid Other

## 2022-05-24 VITALS — BP 108/66 | HR 79 | Temp 98.0°F | Resp 16 | Ht 62.0 in | Wt 148.4 lb

## 2022-05-24 DIAGNOSIS — D500 Iron deficiency anemia secondary to blood loss (chronic): Secondary | ICD-10-CM

## 2022-05-24 MED ORDER — SODIUM CHLORIDE 0.9 % IV SOLN
300.0000 mg | Freq: Once | INTRAVENOUS | Status: AC
  Administered 2022-05-24: 300 mg via INTRAVENOUS
  Filled 2022-05-24: qty 15

## 2022-05-24 NOTE — Progress Notes (Signed)
Diagnosis: Iron Deficiency Anemia  Provider:  Chilton Greathouse MD  Procedure: Infusion  IV Type: Peripheral, IV Location: R Antecubital  Venofer (Iron Sucrose), Dose: 300 mg  Infusion Start Time: 1436  Infusion Stop Time: 1615  Post Infusion IV Care: Peripheral IV Discontinued  Discharge: Condition: Good, Destination: Home . AVS provided to patient.   Performed by:  Loney Hering, LPN

## 2022-06-12 ENCOUNTER — Ambulatory Visit: Payer: Self-pay

## 2022-06-30 ENCOUNTER — Ambulatory Visit
Admission: RE | Admit: 2022-06-30 | Discharge: 2022-06-30 | Disposition: A | Discharge status: Home / Self Care | Payer: Medicaid Other | Source: Ambulatory Visit | Attending: Urgent Care | Admitting: Urgent Care

## 2022-06-30 VITALS — BP 112/73 | HR 61 | Temp 98.8°F | Resp 16

## 2022-06-30 DIAGNOSIS — R110 Nausea: Secondary | ICD-10-CM

## 2022-06-30 DIAGNOSIS — G43809 Other migraine, not intractable, without status migrainosus: Secondary | ICD-10-CM | POA: Diagnosis not present

## 2022-06-30 MED ORDER — ONDANSETRON 8 MG PO TBDP
8.0000 mg | ORAL_TABLET | Freq: Three times a day (TID) | ORAL | 5 refills | Status: DC | PRN
Start: 2022-06-30 — End: 2022-11-30

## 2022-06-30 MED ORDER — ONDANSETRON 8 MG PO TBDP
8.0000 mg | ORAL_TABLET | Freq: Once | ORAL | Status: AC
  Administered 2022-06-30: 8 mg via ORAL

## 2022-06-30 NOTE — ED Provider Notes (Signed)
[facility]  Note:  This document was prepared using Systems analyst and may include unintentional dictation errors.  MRN: [id number] DOB: 1981/01/30  Subjective:   Brittney Watkins is a 41 y.o. female presenting for help with nausea and vomiting. Patient had a migraine earlier and she took her migraine medicine Imitrex. This did help resolve her migraine but it causes nausea and vomiting.  She needs a refill on her Zofran and would like some medication to help her current symptoms.  Denies an ongoing headache.  No current facility-administered medications for this encounter.  Current Outpatient Medications:    cetirizine (ZYRTEC ALLERGY) 10 MG tablet, Take 1 tablet (10 mg total) by mouth at bedtime., Disp: 30 tablet, Rfl: 2   ferrous sulfate 325 (65 FE) MG EC tablet, Take 325 mg by mouth 3 (three) times daily with meals., Disp: , Rfl:    fluticasone (FLONASE) 50 MCG/ACT nasal spray, Place 1 spray into both nostrils daily. Begin by using 2 sprays in each nare daily for 3 to 5 days, then decrease to 1 spray in each nare daily., Disp: 48 mL, Rfl: 1   SUMAtriptan (IMITREX) 25 MG tablet, At onset of headache, take 1 sumatriptan tablet with ibuprofen 400 mg and 1 tablespoon of peanut butter.  Take 2nd sumatriptan tablet in 1 hour if headache not completely resolved.  Do not exceed sumatriptan 2 tablets (50 mg) in 24 hours., Disp: 30 tablet, Rfl: 2   TRI-LO-SPRINTEC 0.18/0.215/0.25 MG-25 MCG tab, Take 1 tablet by mouth daily., Disp: , Rfl:    Allergies  Allergen Reactions   Nyquil Multi-Symptom [Pseudoeph-Doxylamine-Dm-Apap] Swelling and Rash   Shellfish Allergy Rash    Past Medical History:  Diagnosis Date   Blurred vision    Migraine    UTI (urinary tract infection)      Past Surgical History:  Procedure Laterality Date   CHOLECYSTECTOMY  2007   TUBAL LIGATION  2006    Family History  Problem Relation Age of Onset   Heart attack Father     Diabetes Maternal Grandmother        some uncles as well    High blood pressure Maternal Grandmother        some uncles as well   Migraines Neg Hx     Social History   Tobacco Use   Smoking status: Never   Smokeless tobacco: Never  Vaping Use   Vaping Use: Never used  Substance Use Topics   Alcohol use: No    Alcohol/week: 0.0 standard drinks of alcohol   Drug use: No    ROS   Objective:   Vitals: BP 112/73 (BP Location: Right Arm)   Pulse 61   Temp 98.8 F (37.1 C) (Oral)   Resp 16   LMP 06/30/2022 (Exact Date)   SpO2 98%   Physical Exam Constitutional:      General: She is not in acute distress.    Appearance: Normal appearance. She is well-developed. She is not ill-appearing, toxic-appearing or diaphoretic.  HENT:     Head: Normocephalic and atraumatic.     Nose: Nose normal.     Mouth/Throat:     Mouth: Mucous membranes are moist.  Eyes:     General: No scleral icterus.       Right eye: No discharge.        Left eye: No discharge.     Extraocular Movements: Extraocular movements intact.  Cardiovascular:     Rate  and Rhythm: Normal rate.  Pulmonary:     Effort: Pulmonary effort is normal.  Skin:    General: Skin is warm and dry.  Neurological:     General: No focal deficit present.     Mental Status: She is alert and oriented to person, place, and time.  Psychiatric:        Mood and Affect: Mood normal.        Behavior: Behavior normal.        Thought Content: Thought content normal.        Judgment: Judgment normal.     8 mg p.o. Zofran given ODT.  Assessment and Plan :   PDMP not reviewed this encounter.  1. Nausea without vomiting   2. Other migraine without status migrainosus, not intractable     Her migraine has resolved.  I refilled her Zofran.  Anticipatory guidance provided. Counseled patient on potential for adverse effects with medications prescribed/recommended today, ER and return-to-clinic precautions discussed, patient  verbalized understanding.    Jaynee Eagles, PA-C 06/30/22 1624

## 2022-06-30 NOTE — ED Triage Notes (Signed)
Pt states she had a migraine earlier and took an Imitrex but it makes her nauseous  and she is out of her Zofran. Pt would like a refill on her Zofran.

## 2022-07-06 ENCOUNTER — Telehealth: Payer: Self-pay | Admitting: Internal Medicine

## 2022-07-06 NOTE — Telephone Encounter (Signed)
Rescheduled 11/09 appointment due to provider pal, called patient regarding rescheduled appointment.Both of patient's numbers are invalid, calender will be mailed.

## 2022-07-25 ENCOUNTER — Other Ambulatory Visit: Payer: Medicaid Other

## 2022-07-25 ENCOUNTER — Ambulatory Visit: Payer: Medicaid Other | Admitting: Internal Medicine

## 2022-07-27 ENCOUNTER — Other Ambulatory Visit: Payer: Medicaid Other

## 2022-07-27 ENCOUNTER — Ambulatory Visit: Payer: Medicaid Other | Admitting: Internal Medicine

## 2022-08-04 ENCOUNTER — Ambulatory Visit
Admission: RE | Admit: 2022-08-04 | Discharge: 2022-08-04 | Disposition: A | Discharge status: Home / Self Care | Payer: Medicaid Other | Source: Ambulatory Visit

## 2022-08-04 VITALS — BP 112/79 | HR 89 | Temp 98.4°F | Resp 16

## 2022-08-04 DIAGNOSIS — G5603 Carpal tunnel syndrome, bilateral upper limbs: Secondary | ICD-10-CM | POA: Diagnosis not present

## 2022-08-04 NOTE — ED Triage Notes (Signed)
The patient states she fell on her right hand last night. The patient states she does have swelling and pain to the site. The patient is able to flex fingers but does have some numbness/ tingling to the hand.   Home interventions: tylenol

## 2022-08-04 NOTE — Discharge Instructions (Signed)
I provided you with some information about carpal tunnel syndrome that I hope you find helpful.  I believe that you are already doing everything to help with the flareup of your carpal tunnel after having fallen on your right hand.  I recommend that you continue applying ice to your hand and wrist 3-4 times daily for 20 minutes each time and take Aleve 1 tablet every 12 hours until your pain is completely resolved.  I provided you with a note to return to work on Monday.  Thank you for visiting urgent care today.

## 2022-08-04 NOTE — ED Provider Notes (Signed)
[facility]    CSN: [id number] Arrival date & time: 08/04/22  1021    HISTORY   Chief Complaint  Patient presents with   Hand Problem    Hand pain and stiffness - Entered by patient   HPI Brittney Watkins is a pleasant, 41 y.o. female who presents to urgent care today. 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  Patient states she has a history of carpal tunnel syndrome.  Patient states that she fell last night while walking up the stairs and landed on her right hand.  Patient states initially she had some swelling of her right hand and pain at the base of her right hand but that is improved at this time.  Patient states her hand feels a little bit stiff at this time and occasionally has some numbness and tingling in her first second and third fingers.  Patient states she took Tylenol last night which seemed to help a little bit.  Patient denies loss of grip strength, dropping items, shooting pain in her fingers or forearm, prior injury to her wrist.    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 Authorizing Provider  hydrOXYzine (VISTARIL) 25 MG capsule Take 25 mg by mouth daily as needed. 07/31/22  Yes [provider]  cetirizine (ZYRTEC ALLERGY) 10 MG tablet Take 1 tablet (10 mg total) by mouth at  bedtime. 12/27/21 03/27/22  Lynden Oxford Scales, PA-C  ferrous sulfate 325 (65 FE) MG EC tablet Take 325 mg by mouth 3 (three) times daily with meals.    [provider]  fluticasone (FLONASE) 50 MCG/ACT nasal spray Place 1 spray into both nostrils daily. Begin by using 2 sprays in each nare daily for 3 to 5 days, then decrease to 1 spray in each nare daily. 12/27/21   Lynden Oxford Scales, PA-C  ondansetron (ZOFRAN-ODT) 8 MG disintegrating tablet Take 1 tablet (8 mg total) by mouth every 8 (eight) hours as needed for nausea or vomiting. 06/30/22   Jaynee Eagles, PA-C  SUMAtriptan (IMITREX) 25 MG tablet At onset of headache, take 1 sumatriptan tablet with ibuprofen 400 mg and 1 tablespoon of peanut butter.  Take 2nd sumatriptan tablet in 1 hour if headache not completely resolved.  Do not exceed sumatriptan 2 tablets (50 mg) in 24 hours. 12/27/21   Lynden Oxford Scales, PA-C  TRI-LO-SPRINTEC 0.18/0.215/0.25 MG-25 MCG tab Take 1 tablet by mouth daily. 05/05/20   [provider]  dicyclomine (BENTYL) 20 MG tablet Take 1 tablet (20 mg  total) by mouth 4 (four) times daily -  before meals and at bedtime. Patient not taking: Reported on 03/27/2019 03/24/19 07/19/19  Wieters, Hallie C, PA-C  rizatriptan (MAXALT-MLT) 5 MG disintegrating tablet DIS 1 T ON THE TONGUE QD Patient taking differently: Take 5 mg by mouth as needed for migraine.  09/17/18 07/19/19  Mardella Layman, MD    Family History Family History  Problem Relation Age of Onset   Heart attack Father    Diabetes Maternal Grandmother        some uncles as well    High blood pressure Maternal Grandmother        some uncles as well   Migraines Neg Hx    Social History Social History   Tobacco Use   Smoking status: Never   Smokeless tobacco: Never  Vaping Use   Vaping Use: Never used  Substance Use Topics   Alcohol use: No    Alcohol/week: 0.0 standard drinks of alcohol   Drug use: No   Allergies   Nyquil multi-symptom  [pseudoeph-doxylamine-dm-apap] and Shellfish allergy  Review of Systems Review of Systems Pertinent findings revealed after performing a 14 point review of systems has been noted in the history of present illness.  Physical Exam Triage Vital Signs ED Triage Vitals  Enc Vitals Group     BP 07/15/21 0827 (!) 147/82     Pulse Rate 07/15/21 0827 72     Resp 07/15/21 0827 18     Temp 07/15/21 0827 98.3 F (36.8 C)     Temp Source 07/15/21 0827 Oral     SpO2 07/15/21 0827 98 %     Weight --      Height --      Head Circumference --      Peak Flow --      Pain Score 07/15/21 0826 5     Pain Loc --      Pain Edu? --      Excl. in GC? --    Updated Vital Signs BP 112/79 (BP Location: Left Arm)   Pulse 89   Temp 98.4 F (36.9 C) (Oral)   Resp 16   SpO2 98%   Physical Exam Vitals and nursing note reviewed.  Constitutional:      General: She is not in acute distress.    Appearance: Normal appearance.  HENT:     Head: Normocephalic and atraumatic.  Eyes:     Pupils: Pupils are equal, round, and reactive to light.  Cardiovascular:     Rate and Rhythm: Normal rate and regular rhythm.  Pulmonary:     Effort: Pulmonary effort is normal.     Breath sounds: Normal breath sounds.  Musculoskeletal:        General: Normal range of motion.     Right forearm: Normal.     Left forearm: Normal.     Right wrist: No swelling, deformity, effusion, lacerations, tenderness, bony tenderness, snuff box tenderness or crepitus. Normal range of motion. Normal pulse.     Left wrist: No swelling, deformity, effusion, lacerations, tenderness, bony tenderness, snuff box tenderness or crepitus. Normal range of motion. Normal pulse.     Right hand: Normal.     Left hand: Normal.     Cervical back: Normal range of motion and neck supple.     Comments: Positive Phalen sign  Skin:    General: Skin is warm and dry.  Neurological:     General: No focal deficit present.  Mental Status: She is alert  and oriented to person, place, and time. Mental status is at baseline.  Psychiatric:        Mood and Affect: Mood normal.        Behavior: Behavior normal.        Thought Content: Thought content normal.        Judgment: Judgment normal.     UC Couse / Diagnostics / Procedures:     Radiology No results found.  Procedures Procedures (including critical care time) EKG  Pending results:  Labs Reviewed - No data to display  Medications Ordered in UC: Medications - No data to display  UC Diagnoses / Final Clinical Impressions(s)   I have reviewed the triage vital signs and the nursing notes.  Pertinent labs & imaging results that were available during my care of the patient were reviewed by me and considered in my medical decision making (see chart for details).    Final diagnoses:  Bilateral carpal tunnel syndrome   Patient advised okay to continue icing her wrist and taking Aleve twice daily as needed.  Patient advised to follow-up with primary care for worsening symptoms of carpal tunnel syndrome.  Patient provided with patient education.  Return precautions advised.  ED Prescriptions   None    PDMP not reviewed this encounter.  Discharge Instructions:   Discharge Instructions      I provided you with some information about carpal tunnel syndrome that I hope you find helpful.  I believe that you are already doing everything to help with the flareup of your carpal tunnel after having fallen on your right hand.  I recommend that you continue applying ice to your hand and wrist 3-4 times daily for 20 minutes each time and take Aleve 1 tablet every 12 hours until your pain is completely resolved.  I provided you with a note to return to work on Monday.  Thank you for visiting urgent care today.    Disposition Upon Discharge:  Condition: stable for discharge home Home: take medications as prescribed; routine discharge instructions as discussed; follow up as  advised.  Patient presented with an acute illness with associated systemic symptoms and significant discomfort requiring urgent management. In my opinion, this is a condition that a prudent lay person (someone who possesses an average knowledge of health and medicine) may potentially expect to result in complications if not addressed urgently such as respiratory distress, impairment of bodily function or dysfunction of bodily organs.   Routine symptom specific, illness specific and/or disease specific instructions were discussed with the patient and/or caregiver at length.   As such, the patient has been evaluated and assessed, work-up was performed and treatment was provided in alignment with urgent care protocols and evidence based medicine.  Patient/parent/caregiver has been advised that the patient may require follow up for further testing and treatment if the symptoms continue in spite of treatment, as clinically indicated and appropriate.  Patient/parent/caregiver has been advised to report to orthopedic urgent care clinic or return to the [facility] or PCP in 3-5 days if no better; follow-up with orthopedics, PCP or the Emergency Department if new signs and symptoms develop or if the current signs or symptoms continue to change or worsen for further workup, evaluation and treatment as clinically indicated and appropriate  The patient will follow up with their current PCP if and as advised. If the patient does not currently have a PCP we will have assisted them in obtaining one.  The patient may need specialty follow up if the symptoms continue, in spite of conservative treatment and management, for further workup, evaluation, consultation and treatment as clinically indicated and appropriate.  Patient/parent/caregiver verbalized understanding and agreement of plan as discussed.  All questions were addressed during visit.  Please see discharge instructions below for further details of plan.  This  office note has been dictated using Museum/gallery curator.  Unfortunately, this method of dictation can sometimes lead to typographical or grammatical errors.  I apologize for your inconvenience in advance if this occurs.  Please do not hesitate to reach out to me if clarification is needed.      Lynden Oxford Scales, PA-C 08/04/22 1122

## 2022-11-03 ENCOUNTER — Ambulatory Visit
Admission: EM | Admit: 2022-11-03 | Discharge: 2022-11-03 | Disposition: A | Discharge status: Home / Self Care | Payer: Medicaid Other | Attending: Urgent Care | Admitting: Urgent Care

## 2022-11-03 DIAGNOSIS — G43809 Other migraine, not intractable, without status migrainosus: Secondary | ICD-10-CM | POA: Diagnosis not present

## 2022-11-03 MED ORDER — KETOROLAC TROMETHAMINE 60 MG/2ML IM SOLN
60.0000 mg | Freq: Once | INTRAMUSCULAR | Status: AC
  Administered 2022-11-03: 60 mg via INTRAMUSCULAR

## 2022-11-03 MED ORDER — CETIRIZINE HCL 10 MG PO TABS: 10.0000 mg | ORAL_TABLET | Freq: Every day | ORAL | 0 refills | Status: DC

## 2022-11-03 MED ORDER — DEXAMETHASONE SODIUM PHOSPHATE 10 MG/ML IJ SOLN
10.0000 mg | Freq: Once | INTRAMUSCULAR | Status: AC
  Administered 2022-11-03: 10 mg via INTRAMUSCULAR

## 2022-11-03 MED ORDER — FLUTICASONE PROPIONATE 50 MCG/ACT NA SUSP: 2.0000 | Freq: Every day | NASAL | 0 refills | Status: DC

## 2022-11-03 NOTE — ED Triage Notes (Signed)
Pt c/o migraine started yesterday-no relief with imitrex-NAD-steady gait

## 2022-11-03 NOTE — ED Provider Notes (Signed)
[facility]  Note:  This document was prepared using Systems analyst and may include unintentional dictation errors.  MRN: [id number] DOB: 1980/12/25  Subjective:   Brittney Watkins is a 42 y.o. female presenting for 1 day history of persistent moderate to severe bilateral frontal headache with pressure behind her eyes.  Believes it sinus related.  Has not been able to use her Flonase as she ran out.  Has had photophobia, throbbing type sensation.  She used Imitrex and did not get any relief.  No confusion, blurred vision, double vision, weakness, numbness or tingling, nausea or vomiting.  She used to see a neurologist but has not in some time.  Her headaches have generally been well-controlled.  No current facility-administered medications for this encounter.  Current Outpatient Medications:    cetirizine (ZYRTEC ALLERGY) 10 MG tablet, Take 1 tablet (10 mg total) by mouth at bedtime., Disp: 30 tablet, Rfl: 2   ferrous sulfate 325 (65 FE) MG EC tablet, Take 325 mg by mouth 3 (three) times daily with meals., Disp: , Rfl:    fluticasone (FLONASE) 50 MCG/ACT nasal spray, Place 1 spray into both nostrils daily. Begin by using 2 sprays in each nare daily for 3 to 5 days, then decrease to 1 spray in each nare daily., Disp: 48 mL, Rfl: 1   hydrOXYzine (VISTARIL) 25 MG capsule, Take 25 mg by mouth daily as needed., Disp: , Rfl:    ondansetron (ZOFRAN-ODT) 8 MG disintegrating tablet, Take 1 tablet (8 mg total) by mouth every 8 (eight) hours as needed for nausea or vomiting., Disp: 30 tablet, Rfl: 5   SUMAtriptan (IMITREX) 25 MG tablet, At onset of headache, take 1 sumatriptan tablet with ibuprofen 400 mg and 1 tablespoon of peanut butter.  Take 2nd sumatriptan tablet in 1 hour if headache not completely resolved.  Do not exceed sumatriptan 2 tablets (50 mg) in 24 hours., Disp: 30 tablet, Rfl: 2   TRI-LO-SPRINTEC 0.18/0.215/0.25 MG-25 MCG tab, Take 1 tablet by  mouth daily., Disp: , Rfl:    Allergies  Allergen Reactions   Nyquil Multi-Symptom [Pseudoeph-Doxylamine-Dm-Apap] Swelling and Rash   Shellfish Allergy Rash    Past Medical History:  Diagnosis Date   Blurred vision    Migraine    UTI (urinary tract infection)      Past Surgical History:  Procedure Laterality Date   CHOLECYSTECTOMY  2007   TUBAL LIGATION  2006    Family History  Problem Relation Age of Onset   Heart attack Father    Diabetes Maternal Grandmother        some uncles as well    High blood pressure Maternal Grandmother        some uncles as well   Migraines Neg Hx     Social History   Tobacco Use   Smoking status: Never   Smokeless tobacco: Never  Vaping Use   Vaping Use: Never used  Substance Use Topics   Alcohol use: No    Alcohol/week: 0.0 standard drinks of alcohol   Drug use: No    ROS   Objective:   Vitals: BP 111/70 (BP Location: Left Arm)   Pulse 72   Temp 98.3 F (36.8 C) (Oral)   Resp 20   LMP 10/02/2022   SpO2 98%   Physical Exam Constitutional:      General: She is not in acute distress.    Appearance: Normal appearance. She is well-developed and normal weight. She  is not ill-appearing, toxic-appearing or diaphoretic.  HENT:     Head: Normocephalic and atraumatic.     Right Ear: Tympanic membrane, ear canal and external ear normal. No drainage or tenderness. No middle ear effusion. There is no impacted cerumen. Tympanic membrane is not erythematous or bulging.     Left Ear: Tympanic membrane, ear canal and external ear normal. No drainage or tenderness.  No middle ear effusion. There is no impacted cerumen. Tympanic membrane is not erythematous or bulging.     Nose: Nose normal. No congestion or rhinorrhea.     Mouth/Throat:     Mouth: Mucous membranes are moist. No oral lesions.     Pharynx: No pharyngeal swelling, oropharyngeal exudate, posterior oropharyngeal erythema or uvula swelling.     Tonsils: No tonsillar exudate  or tonsillar abscesses.  Eyes:     General: No scleral icterus.       Right eye: No discharge.        Left eye: No discharge.     Extraocular Movements: Extraocular movements intact.     Right eye: Normal extraocular motion.     Left eye: Normal extraocular motion.     Conjunctiva/sclera: Conjunctivae normal.  Neck:     Meningeal: Brudzinski's sign and Kernig's sign absent.  Cardiovascular:     Rate and Rhythm: Normal rate.  Pulmonary:     Effort: Pulmonary effort is normal.  Musculoskeletal:     Cervical back: Normal range of motion and neck supple.  Lymphadenopathy:     Cervical: No cervical adenopathy.  Skin:    General: Skin is warm and dry.  Neurological:     General: No focal deficit present.     Mental Status: She is alert and oriented to person, place, and time.     Cranial Nerves: No cranial nerve deficit, dysarthria or facial asymmetry.     Motor: No weakness or pronator drift.     Coordination: Romberg sign negative. Coordination normal. Finger-Nose-Finger Test and Heel to [facility] Test normal. Rapid alternating movements normal.     Gait: Gait and tandem walk normal.     Deep Tendon Reflexes: Reflexes normal.  Psychiatric:        Mood and Affect: Mood normal.        Behavior: Behavior normal.        Thought Content: Thought content normal.        Judgment: Judgment normal.    IM Toradol in clinic at 60 mg, IM dexamethasone at 10 mg.  Assessment and Plan :   PDMP not reviewed this encounter.  1. Other migraine without status migrainosus, not intractable     The above measures were used for in clinic treatment of her migraine.  I refilled her Flonase, recommended starting Zyrtec.  No signs of an acute encephalopathy warranting an ER visit, CT imaging. Counseled patient on potential for adverse effects with medications prescribed/recommended today, ER and return-to-clinic precautions discussed, patient verbalized understanding.    Jaynee Eagles, PA-C 11/03/22  1004

## 2022-11-29 ENCOUNTER — Other Ambulatory Visit: Payer: Self-pay | Admitting: Medical Oncology

## 2022-11-29 DIAGNOSIS — D500 Iron deficiency anemia secondary to blood loss (chronic): Secondary | ICD-10-CM

## 2022-11-30 ENCOUNTER — Other Ambulatory Visit: Payer: Self-pay

## 2022-11-30 ENCOUNTER — Inpatient Hospital Stay (HOSPITAL_BASED_OUTPATIENT_CLINIC_OR_DEPARTMENT_OTHER): Payer: Medicaid Other | Admitting: Internal Medicine

## 2022-11-30 ENCOUNTER — Inpatient Hospital Stay: Payer: Medicaid Other | Attending: Nurse Practitioner

## 2022-11-30 VITALS — BP 110/75 | HR 71 | Temp 98.6°F | Resp 18 | Wt 144.2 lb

## 2022-11-30 DIAGNOSIS — N920 Excessive and frequent menstruation with regular cycle: Secondary | ICD-10-CM | POA: Diagnosis not present

## 2022-11-30 DIAGNOSIS — D500 Iron deficiency anemia secondary to blood loss (chronic): Secondary | ICD-10-CM | POA: Diagnosis not present

## 2022-11-30 LAB — CBC WITH DIFFERENTIAL (CANCER CENTER ONLY)
Abs Immature Granulocytes: 0.01 10*3/uL (ref 0.00–0.07)
Basophils Absolute: 0 10*3/uL (ref 0.0–0.1)
Basophils Relative: 1 %
Eosinophils Absolute: 0.1 10*3/uL (ref 0.0–0.5)
Eosinophils Relative: 3 %
HCT: 39.5 % (ref 36.0–46.0)
Hemoglobin: 13.2 g/dL (ref 12.0–15.0)
Immature Granulocytes: 0 %
Lymphocytes Relative: 49 %
Lymphs Abs: 2.5 10*3/uL (ref 0.7–4.0)
MCH: 28.1 pg (ref 26.0–34.0)
MCHC: 33.4 g/dL (ref 30.0–36.0)
MCV: 84 fL (ref 80.0–100.0)
Monocytes Absolute: 0.4 10*3/uL (ref 0.1–1.0)
Monocytes Relative: 7 %
Neutro Abs: 2.1 10*3/uL (ref 1.7–7.7)
Neutrophils Relative %: 40 %
Platelet Count: 160 10*3/uL (ref 150–400)
RBC: 4.7 MIL/uL (ref 3.87–5.11)
RDW: 13.7 % (ref 11.5–15.5)
WBC Count: 5.1 10*3/uL (ref 4.0–10.5)
nRBC: 0 % (ref 0.0–0.2)

## 2022-11-30 LAB — IRON AND IRON BINDING CAPACITY (CC-WL,HP ONLY)
Iron: 78 ug/dL (ref 28–170)
Saturation Ratios: 25 % (ref 10.4–31.8)
TIBC: 315 ug/dL (ref 250–450)
UIBC: 237 ug/dL (ref 148–442)

## 2022-11-30 LAB — FERRITIN: Ferritin: 108 ng/mL (ref 11–307)

## 2022-11-30 NOTE — Progress Notes (Signed)
[place] [phone]   [phone]  OFFICE PROGRESS NOTE  [name], [place] [id number]  DIAGNOSIS: Iron deficiency anemia secondary to menorrhagia with significant blood loss.  She has intolerance to the oral iron tablets.  PRIOR THERAPY: Iron infusion with Venofer 300 Mg IV weekly for 3 weeks  CURRENT THERAPY: Observation  INTERVAL HISTORY: Brittney Watkins 42 y.o. female returns to the clinic today for [place] visit.  The patient is feeling fine today with no concerning complaints.  She felt much better after receiving iron infusion.  She denied having any current chest pain, shortness of breath, cough or hemoptysis.  She has no nausea, vomiting, diarrhea or constipation.  She had vaginal bleeding for around 2 weeks after cervical biopsy and ablation but this resolved around 10 days ago.  She denied having any dizzy spells.  She is here today for evaluation and repeat blood work.  MEDICAL HISTORY: Past Medical History:  Diagnosis Date   Blurred vision    Migraine    UTI (urinary tract infection)     ALLERGIES:  is allergic to nyquil multi-symptom [pseudoeph-doxylamine-dm-apap] and shellfish allergy.  MEDICATIONS:  Current Outpatient Medications  Medication Sig Dispense Refill   cetirizine (ZYRTEC ALLERGY) 10 MG tablet Take 1 tablet (10 mg total) by mouth at bedtime. 90 tablet 0   ferrous sulfate 325 (65 FE) MG EC tablet Take 325 mg by mouth 3 (three) times daily with meals.     fluticasone (FLONASE) 50 MCG/ACT nasal spray Place 2 sprays into both nostrils daily. 16 g 0   hydrOXYzine (VISTARIL) 25 MG capsule Take 25 mg by mouth daily as needed.     ondansetron (ZOFRAN-ODT) 8 MG disintegrating tablet Take 1 tablet (8 mg total) by mouth every 8 (eight) hours as needed for nausea or vomiting. 30 tablet 5   SUMAtriptan (IMITREX) 25 MG tablet At onset of headache, take 1 sumatriptan tablet with ibuprofen  400 mg and 1 tablespoon of peanut butter.  Take 2nd sumatriptan tablet in 1 hour if headache not completely resolved.  Do not exceed sumatriptan 2 tablets (50 mg) in 24 hours. 30 tablet 2   TRI-LO-SPRINTEC 0.18/0.215/0.25 MG-25 MCG tab Take 1 tablet by mouth daily.     No current facility-administered medications for this visit.    SURGICAL HISTORY:  Past Surgical History:  Procedure Laterality Date   CHOLECYSTECTOMY  2007   TUBAL LIGATION  2006    REVIEW OF SYSTEMS:  A comprehensive review of systems was negative.   PHYSICAL EXAMINATION: General appearance: alert, cooperative, and no distress Head: Normocephalic, without obvious abnormality, atraumatic Neck: no adenopathy, no JVD, supple, symmetrical, trachea midline, and thyroid not enlarged, symmetric, no tenderness/mass/nodules Lymph nodes: Cervical, supraclavicular, and axillary nodes normal. Resp: clear to auscultation bilaterally Back: symmetric, no curvature. ROM normal. No CVA tenderness. Cardio: regular rate and rhythm, S1, S2 normal, no murmur, click, rub or gallop GI: soft, non-tender; bowel sounds normal; no masses,  no organomegaly Extremities: extremities normal, atraumatic, no cyanosis or edema  ECOG PERFORMANCE STATUS: 1 - Symptomatic but completely ambulatory  Blood pressure 110/75, pulse 71, temperature 98.6 F (37 C), temperature source Oral, resp. rate 18, weight 144 lb 4 oz (65.4 kg), last menstrual period 10/02/2022, SpO2 98 %.  LABORATORY DATA: Lab Results  Component Value Date   WBC 4.8 04/25/2022   HGB 11.5 (L) 04/25/2022   HCT 33.9 (L) 04/25/2022   MCV  82.7 04/25/2022   PLT 161 04/25/2022      Chemistry      Component Value Date/Time   NA 138 04/25/2022 1140   NA 139 11/26/2014 1557   K 4.0 04/25/2022 1140   CL 109 04/25/2022 1140   CO2 26 04/25/2022 1140   BUN 8 04/25/2022 1140   BUN 12 11/26/2014 1557   CREATININE 0.95 04/25/2022 1140      Component Value Date/Time   CALCIUM 9.1  04/25/2022 1140   ALKPHOS 49 04/25/2022 1140   AST 21 04/25/2022 1140   ALT 16 04/25/2022 1140   BILITOT 0.5 04/25/2022 1140       RADIOGRAPHIC STUDIES: No results found.  ASSESSMENT AND PLAN: This is a very pleasant 42 years old African-American female with iron deficiency anemia secondary to menorrhagia.  The patient also has intolerance to the oral iron tablets. She received iron infusion with Venofer 300 Mg IV weekly for 3 weeks and she felt much better. Repeat CBC today showed normal hemoglobin and hematocrit but the iron study and ferritin are still pending. I recommended for the patient to continue on observation with repeat blood work in 3 months.  If the pending iron studies showed significant deficiency, we will arrange for the patient to receive iron infusion in the interval. She was advised to call immediately if she has any concerning symptoms in the interval. The patient voices understanding of current disease status and treatment options and is in agreement with the current care plan.  All questions were answered. The patient knows to call the clinic with any problems, questions or concerns. We can certainly see the patient much sooner if necessary.  The total time spent in the appointment was 20 minutes.  Disclaimer: This note was dictated with voice recognition software. Similar sounding words can inadvertently be transcribed and may not be corrected upon review.

## 2023-02-04 ENCOUNTER — Ambulatory Visit: Payer: Self-pay

## 2023-02-28 ENCOUNTER — Other Ambulatory Visit: Payer: Self-pay

## 2023-02-28 ENCOUNTER — Inpatient Hospital Stay (HOSPITAL_BASED_OUTPATIENT_CLINIC_OR_DEPARTMENT_OTHER): Payer: Medicaid Other | Admitting: Internal Medicine

## 2023-02-28 ENCOUNTER — Inpatient Hospital Stay: Payer: Medicaid Other | Attending: Nurse Practitioner

## 2023-02-28 VITALS — BP 113/67 | HR 61 | Temp 98.1°F | Resp 17 | Wt 143.0 lb

## 2023-02-28 DIAGNOSIS — D500 Iron deficiency anemia secondary to blood loss (chronic): Secondary | ICD-10-CM | POA: Diagnosis present

## 2023-02-28 LAB — CBC WITH DIFFERENTIAL (CANCER CENTER ONLY)
Abs Immature Granulocytes: 0 10*3/uL (ref 0.00–0.07)
Basophils Absolute: 0 10*3/uL (ref 0.0–0.1)
Basophils Relative: 1 %
Eosinophils Absolute: 0.1 10*3/uL (ref 0.0–0.5)
Eosinophils Relative: 2 %
HCT: 36 % (ref 36.0–46.0)
Hemoglobin: 11.8 g/dL — ABNORMAL LOW (ref 12.0–15.0)
Immature Granulocytes: 0 %
Lymphocytes Relative: 49 %
Lymphs Abs: 2 10*3/uL (ref 0.7–4.0)
MCH: 27.6 pg (ref 26.0–34.0)
MCHC: 32.8 g/dL (ref 30.0–36.0)
MCV: 84.1 fL (ref 80.0–100.0)
Monocytes Absolute: 0.4 10*3/uL (ref 0.1–1.0)
Monocytes Relative: 9 %
Neutro Abs: 1.6 10*3/uL — ABNORMAL LOW (ref 1.7–7.7)
Neutrophils Relative %: 39 %
Platelet Count: 170 10*3/uL (ref 150–400)
RBC: 4.28 MIL/uL (ref 3.87–5.11)
RDW: 13.5 % (ref 11.5–15.5)
WBC Count: 4.1 10*3/uL (ref 4.0–10.5)
nRBC: 0 % (ref 0.0–0.2)

## 2023-02-28 LAB — IRON AND IRON BINDING CAPACITY (CC-WL,HP ONLY)
Iron: 97 ug/dL (ref 28–170)
Saturation Ratios: 35 % — ABNORMAL HIGH (ref 10.4–31.8)
TIBC: 279 ug/dL (ref 250–450)
UIBC: 182 ug/dL (ref 148–442)

## 2023-02-28 LAB — FERRITIN: Ferritin: 103 ng/mL (ref 11–307)

## 2023-02-28 NOTE — Progress Notes (Signed)
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  Brittney Watkins has intolerance to the oral iron tablets.  PRIOR THERAPY: Iron infusion with Venofer 300 Mg IV weekly for 3 weeks  CURRENT THERAPY: Over-the-counter liquid iron supplement  INTERVAL HISTORY: Brittney Watkins 42 y.o. female returns to the clinic today for follow-up visit.  The patient is feeling fine today with no concerning complaints except for occasional dizzy spells.  Brittney Watkins denied having any current chest pain, shortness of breath, cough or hemoptysis.  Brittney Watkins has no nausea, vomiting, diarrhea or constipation.  Brittney Watkins is currently on oral contraceptive pills and her menstrual period are not as heavy as before.  Brittney Watkins is also on over-the-counter liquid iron supplement and Brittney Watkins is tolerating it fairly well.  Brittney Watkins is here for evaluation and repeat blood work.  MEDICAL HISTORY: Past Medical History:  Diagnosis Date   Blurred vision    Migraine    UTI (urinary tract infection)     ALLERGIES:  is allergic to nyquil multi-symptom [pseudoeph-doxylamine-dm-apap] and shellfish allergy.  MEDICATIONS:  Current Outpatient Medications  Medication Sig Dispense Refill   ferrous sulfate 325 (65 FE) MG EC tablet Take 325 mg by mouth 3 (three) times daily with meals.     SUMAtriptan (IMITREX) 25 MG tablet At onset of headache, take 1 sumatriptan tablet with ibuprofen 400 mg and 1 tablespoon of peanut butter.  Take 2nd sumatriptan tablet in 1 hour if headache not completely resolved.  Do not exceed sumatriptan 2 tablets (50 mg) in 24 hours. 30 tablet 2   No current facility-administered medications for this visit.    SURGICAL HISTORY:  Past Surgical History:  Procedure Laterality Date   CHOLECYSTECTOMY  2007   TUBAL LIGATION  2006    REVIEW OF  SYSTEMS:  A comprehensive review of systems was negative except for: Neurological: positive for dizziness   PHYSICAL EXAMINATION: General appearance: alert, cooperative, and no distress Head: Normocephalic, without obvious abnormality, atraumatic Neck: no adenopathy, no JVD, supple, symmetrical, trachea midline, and thyroid not enlarged, symmetric, no tenderness/mass/nodules Lymph nodes: Cervical, supraclavicular, and axillary nodes normal. Resp: clear to auscultation bilaterally Back: symmetric, no curvature. ROM normal. No CVA tenderness. Cardio: regular rate and rhythm, S1, S2 normal, no murmur, click, rub or gallop GI: soft, non-tender; bowel sounds normal; no masses,  no organomegaly Extremities: extremities normal, atraumatic, no cyanosis or edema  ECOG PERFORMANCE STATUS: 1 - Symptomatic but completely ambulatory  Blood pressure 113/67, pulse 61, temperature 98.1 F (36.7 C), temperature source Oral, resp. rate 17, weight 143 lb (64.9 kg), SpO2 100 %.  LABORATORY DATA: Lab Results  Component Value Date   WBC 5.1 11/30/2022   HGB 13.2 11/30/2022   HCT 39.5 11/30/2022   MCV 84.0 11/30/2022   PLT 160 11/30/2022      Chemistry      Component Value Date/Time   NA 138 04/25/2022 1140   NA 139 11/26/2014 1557   K 4.0 04/25/2022 1140   CL 109 04/25/2022 1140   CO2 26 04/25/2022 1140   BUN 8 04/25/2022 1140   BUN 12 11/26/2014 1557   CREATININE 0.95 04/25/2022 1140      Component Value Date/Time   CALCIUM 9.1 04/25/2022 1140   ALKPHOS 49 04/25/2022 1140   AST 21 04/25/2022 1140   ALT  16 04/25/2022 1140   BILITOT 0.5 04/25/2022 1140       RADIOGRAPHIC STUDIES: No results found.  ASSESSMENT AND PLAN: This is a very pleasant 42 years old African-American female with iron deficiency anemia secondary to menorrhagia.  The patient also has intolerance to the oral iron tablets. Brittney Watkins received iron infusion with Venofer 300 Mg IV weekly for 3 weeks and Brittney Watkins felt much  better. Brittney Watkins is currently on over-the-counter liquid iron and tolerating it well. Brittney Watkins is feeling fine today with no concerning complaints except for occasional dizzy spells. Brittney Watkins had repeat CBC, iron study and ferritin performed earlier today.  Her CBC showed hemoglobin of 11.8.  Iron study and ferritin are still pending. I recommended for the patient to continue on the liquid iron for now and I will see her back for follow-up visit in 3 months. If the iron studies showed significant deficiency, I will arrange for the patient to receive iron infusion in the interval. The patient was advised to call immediately if Brittney Watkins has any concerning symptoms. The patient voices understanding of current disease status and treatment options and is in agreement with the current care plan.  All questions were answered. The patient knows to call the clinic with any problems, questions or concerns. We can certainly see the patient much sooner if necessary.  The total time spent in the appointment was 20 minutes.  Disclaimer: This note was dictated with voice recognition software. Similar sounding words can inadvertently be transcribed and may not be corrected upon review.

## 2023-05-04 ENCOUNTER — Ambulatory Visit: Payer: Self-pay

## 2023-05-06 ENCOUNTER — Ambulatory Visit (HOSPITAL_COMMUNITY): Payer: Medicaid Other

## 2023-05-29 ENCOUNTER — Ambulatory Visit: Payer: Self-pay

## 2023-08-27 ENCOUNTER — Other Ambulatory Visit: Payer: Self-pay

## 2023-08-27 ENCOUNTER — Encounter (HOSPITAL_BASED_OUTPATIENT_CLINIC_OR_DEPARTMENT_OTHER): Payer: Self-pay

## 2023-08-27 ENCOUNTER — Emergency Department (HOSPITAL_BASED_OUTPATIENT_CLINIC_OR_DEPARTMENT_OTHER)
Admission: EM | Admit: 2023-08-27 | Discharge: 2023-08-28 | Disposition: A | Discharge status: Home / Self Care | Payer: Medicaid Other | Attending: Emergency Medicine | Admitting: Emergency Medicine

## 2023-08-27 DIAGNOSIS — G43009 Migraine without aura, not intractable, without status migrainosus: Secondary | ICD-10-CM | POA: Diagnosis not present

## 2023-08-27 DIAGNOSIS — R519 Headache, unspecified: Secondary | ICD-10-CM | POA: Diagnosis present

## 2023-08-27 LAB — URINALYSIS, MICROSCOPIC (REFLEX)

## 2023-08-27 LAB — BASIC METABOLIC PANEL
Anion gap: 7 (ref 5–15)
BUN: 9 mg/dL (ref 6–20)
CO2: 24 mmol/L (ref 22–32)
Calcium: 9 mg/dL (ref 8.9–10.3)
Chloride: 106 mmol/L (ref 98–111)
Creatinine, Ser: 0.88 mg/dL (ref 0.44–1.00)
GFR, Estimated: >60 mL/min (ref >60)
Glucose, Bld: 114 mg/dL — ABNORMAL HIGH (ref 70–99)
Potassium: 3.4 mmol/L — ABNORMAL LOW (ref 3.5–5.1)
Sodium: 137 mmol/L (ref 135–145)

## 2023-08-27 LAB — URINALYSIS, ROUTINE W REFLEX MICROSCOPIC
Bilirubin Urine: NEGATIVE
Glucose, UA: NEGATIVE mg/dL
Hgb urine dipstick: NEGATIVE
Ketones, ur: NEGATIVE mg/dL
Nitrite: NEGATIVE
Protein, ur: 30 mg/dL — AB
Specific Gravity, Urine: 1.025 (ref 1.005–1.030)
pH: 7 (ref 5.0–8.0)

## 2023-08-27 LAB — CBC WITH DIFFERENTIAL/PLATELET
Abs Immature Granulocytes: 0.02 10*3/uL (ref 0.00–0.07)
Basophils Absolute: 0 10*3/uL (ref 0.0–0.1)
Basophils Relative: 1 %
Eosinophils Absolute: 0.1 10*3/uL (ref 0.0–0.5)
Eosinophils Relative: 1 %
HCT: 34.3 % — ABNORMAL LOW (ref 36.0–46.0)
Hemoglobin: 11.4 g/dL — ABNORMAL LOW (ref 12.0–15.0)
Immature Granulocytes: 0 %
Lymphocytes Relative: 32 %
Lymphs Abs: 2.4 10*3/uL (ref 0.7–4.0)
MCH: 27.3 pg (ref 26.0–34.0)
MCHC: 33.2 g/dL (ref 30.0–36.0)
MCV: 82.1 fL (ref 80.0–100.0)
Monocytes Absolute: 0.4 10*3/uL (ref 0.1–1.0)
Monocytes Relative: 5 %
Neutro Abs: 4.6 10*3/uL (ref 1.7–7.7)
Neutrophils Relative %: 61 %
Platelets: 178 10*3/uL (ref 150–400)
RBC: 4.18 MIL/uL (ref 3.87–5.11)
RDW: 15.1 % (ref 11.5–15.5)
WBC: 7.5 10*3/uL (ref 4.0–10.5)
nRBC: 0 % (ref 0.0–0.2)

## 2023-08-27 LAB — PREGNANCY, URINE: Preg Test, Ur: NEGATIVE

## 2023-08-27 MED ORDER — KETOROLAC TROMETHAMINE 15 MG/ML IJ SOLN
15.0000 mg | Freq: Once | INTRAMUSCULAR | Status: AC
  Administered 2023-08-27: 15 mg via INTRAVENOUS
  Filled 2023-08-27: qty 1

## 2023-08-27 MED ORDER — PROCHLORPERAZINE EDISYLATE 10 MG/2ML IJ SOLN
10.0000 mg | Freq: Once | INTRAMUSCULAR | Status: AC
  Administered 2023-08-27: 10 mg via INTRAVENOUS
  Filled 2023-08-27: qty 2

## 2023-08-27 MED ORDER — DEXAMETHASONE SODIUM PHOSPHATE 10 MG/ML IJ SOLN
6.0000 mg | Freq: Once | INTRAMUSCULAR | Status: AC
  Administered 2023-08-27: 6 mg via INTRAVENOUS
  Filled 2023-08-27: qty 1

## 2023-08-27 NOTE — ED Provider Notes (Signed)
[place] [facility] Provider Note  CSN: [id number] Arrival date & time: 08/27/23 2211  Chief Complaint(s) Headache  HPI Brittney Watkins is a 42 y.o. female     Headache Pain location:  R parietal Quality: throbbing. Onset quality:  Gradual Duration:  1 day Timing:  Constant Progression:  Waxing and waning Chronicity:  Recurrent Similar to prior headaches: yes   Relieved by: dark room. 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 Authorizing Provider  ferrous sulfate 325 (65 FE) MG EC tablet Take 325 mg by mouth 3 (three) times daily with meals.    [provider]  SUMAtriptan (IMITREX) 25 MG tablet At onset of headache, take 1 sumatriptan tablet with ibuprofen 400 mg and 1 tablespoon of peanut butter.  Take 2nd sumatriptan tablet in 1 hour if headache not completely resolved.  Do not exceed sumatriptan 2 tablets (50 mg) in 24 hours. 12/27/21   Theadora Rama Scales, PA-C  dicyclomine (BENTYL) 20 MG tablet Take 1 tablet (20 mg total) by mouth 4 (four) times daily -  before meals and at bedtime. Patient not taking: Reported on 03/27/2019 03/24/19 07/19/19  Wieters, Hallie C, PA-C  rizatriptan (MAXALT-MLT) 5 MG disintegrating tablet DIS 1 T ON THE TONGUE QD Patient taking differently: Take 5 mg by mouth as needed for migraine.   09/17/18 07/19/19  Mardella Layman, MD                                                                                                                                    Allergies Nyquil multi-symptom [pseudoeph-doxylamine-dm-apap] and Shellfish allergy  Review of Systems Review of Systems  Constitutional:  Negative for fever.  HENT:  Negative for congestion.   Eyes:  Positive for photophobia. Negative for blurred vision.  Respiratory:  Negative for cough.   Gastrointestinal:  Positive for nausea and vomiting.  Musculoskeletal:  Negative for neck stiffness.  Neurological:  Positive for headaches. Negative for dizziness.   As noted in HPI  Physical Exam Vital Signs  I have reviewed the triage vital signs BP 115/69 (BP Location: Left Arm)   Pulse 72   Temp 98 F (36.7 C)   Resp 18   Ht 5\' 2"  (1.575 m)   Wt  58.1 kg   SpO2 98%   BMI 23.41 kg/m   Physical Exam Vitals reviewed.  Constitutional:      General: She is not in acute distress.    Appearance: She is well-developed. She is not diaphoretic.  HENT:     Head: Normocephalic and atraumatic.     Right Ear: External ear normal.     Left Ear: External ear normal.     Nose: Nose normal.  Eyes:     General: No scleral icterus.    Conjunctiva/sclera: Conjunctivae normal.  Neck:     Trachea: Phonation normal.  Cardiovascular:     Rate and Rhythm: Normal rate and regular rhythm.  Pulmonary:     Effort: Pulmonary effort is normal. No respiratory distress.     Breath sounds: No stridor.  Abdominal:     General: There is no distension.  Musculoskeletal:        General: Normal range of motion.     Cervical back: Normal range of motion.  Neurological:     Mental Status: She is alert and oriented to person, place, and time.     Cranial Nerves: Cranial nerves 2-12 are intact.     Sensory: Sensation is intact.     Motor: Motor function is intact.     Coordination: Coordination is intact.  Psychiatric:        Behavior:  Behavior normal.     ED Results and Treatments Labs (all labs ordered are listed, but only abnormal results are displayed) Labs Reviewed  CBC WITH DIFFERENTIAL/PLATELET - Abnormal; Notable for the following components:      Result Value   Hemoglobin 11.4 (*)    HCT 34.3 (*)    All other components within normal limits  BASIC METABOLIC PANEL - Abnormal; Notable for the following components:   Potassium 3.4 (*)    Glucose, Bld 114 (*)    All other components within normal limits  URINALYSIS, ROUTINE W REFLEX MICROSCOPIC - Abnormal; Notable for the following components:   Protein, ur 30 (*)    Leukocytes,Ua SMALL (*)    All other components within normal limits  URINALYSIS, MICROSCOPIC (REFLEX) - Abnormal; Notable for the following components:   Bacteria, UA FEW (*)    All other components within normal limits  PREGNANCY, URINE                                                                                                                         EKG  EKG Interpretation Date/Time:    Ventricular Rate:    PR Interval:    QRS Duration:    QT Interval:    QTC Calculation:   R Axis:      Text Interpretation:         Radiology No results found.  Medications Ordered in ED Medications  prochlorperazine (COMPAZINE) injection 10 mg (10 mg Intravenous Given 08/27/23 2318)  ketorolac (TORADOL) 15 MG/ML injection 15 mg (15 mg Intravenous Given 08/27/23 2315)  dexamethasone (DECADRON) injection 6 mg (6 mg Intravenous Given 08/27/23 2309)   Procedures Procedures  (including critical care time) Medical Decision Making / ED Course   Medical Decision Making Amount and/or Complexity of Data Reviewed Labs: ordered.  Risk Prescription drug management.    Typical headache for the patient. Non focal neuro exam.  No fever. Doubt meningitis.  Doubt IIH. No recent head trauma. Doubt intracranial bleed.  No indication for imaging.  Will treat with migraine cocktail and  reevaluate.  Clinical Course as of 08/27/23 2347  Mon Aug 27, 2023  2346 On reassessment patient headache improving.  [PC]    Clinical Course User Index [PC] Adyan Palau, Amadeo Garnet, MD    Final Clinical Impression(s) / ED Diagnoses Final diagnoses:  Migraine without aura and without status migrainosus, not intractable   The patient appears reasonably screened and/or stabilized for discharge and I doubt any other medical condition or other [facility] requiring further screening, evaluation, or treatment in the ED at this time. I have discussed the findings, Dx and Tx plan with the patient/family who expressed understanding and agree(s) with the plan. Discharge instructions discussed at length. The patient/family was given strict return precautions who verbalized understanding of the instructions. No further questions at time of discharge.  Disposition: Discharge  Condition: Good  ED Discharge Orders     None         Follow Up: Norm Salt, PA [address] [place] [address] [phone]  Call  to schedule an appointment for close follow up    This chart was dictated using voice recognition software.  Despite best efforts to proofread,  errors can occur which can change the documentation meaning.    Nira Conn, MD 08/27/23 [phone]

## 2023-08-27 NOTE — ED Triage Notes (Signed)
Pt arrives with c/o headache that started this morning. Pt endorses n/v and light sensitivity. Pt has hx of migraines. Pt was out of prescription migraine meds and did not get relief with Excedrin. Pt denies blurry vision.

## 2023-12-01 ENCOUNTER — Encounter (HOSPITAL_BASED_OUTPATIENT_CLINIC_OR_DEPARTMENT_OTHER): Payer: Self-pay | Admitting: Emergency Medicine

## 2023-12-01 ENCOUNTER — Other Ambulatory Visit: Payer: Self-pay

## 2023-12-01 DIAGNOSIS — D649 Anemia, unspecified: Secondary | ICD-10-CM | POA: Diagnosis not present

## 2023-12-01 DIAGNOSIS — N939 Abnormal uterine and vaginal bleeding, unspecified: Secondary | ICD-10-CM | POA: Insufficient documentation

## 2023-12-01 LAB — CBC WITH DIFFERENTIAL/PLATELET
Abs Immature Granulocytes: 0.02 10*3/uL (ref 0.00–0.07)
Basophils Absolute: 0 10*3/uL (ref 0.0–0.1)
Basophils Relative: 0 %
Eosinophils Absolute: 0.2 10*3/uL (ref 0.0–0.5)
Eosinophils Relative: 2 %
HCT: 35.6 % — ABNORMAL LOW (ref 36.0–46.0)
Hemoglobin: 11.7 g/dL — ABNORMAL LOW (ref 12.0–15.0)
Immature Granulocytes: 0 %
Lymphocytes Relative: 38 %
Lymphs Abs: 2.7 10*3/uL (ref 0.7–4.0)
MCH: 27.8 pg (ref 26.0–34.0)
MCHC: 32.9 g/dL (ref 30.0–36.0)
MCV: 84.6 fL (ref 80.0–100.0)
Monocytes Absolute: 0.6 10*3/uL (ref 0.1–1.0)
Monocytes Relative: 8 %
Neutro Abs: 3.6 10*3/uL (ref 1.7–7.7)
Neutrophils Relative %: 52 %
Platelets: 180 10*3/uL (ref 150–400)
RBC: 4.21 MIL/uL (ref 3.87–5.11)
RDW: 14.4 % (ref 11.5–15.5)
WBC: 7.1 10*3/uL (ref 4.0–10.5)
nRBC: 0 % (ref 0.0–0.2)

## 2023-12-01 NOTE — ED Triage Notes (Signed)
 Pt with vaginal bleeding x 2 wks that is not her regular period; sts it has been getting heavier; went through one overnight pad in one hour

## 2023-12-02 ENCOUNTER — Emergency Department (HOSPITAL_BASED_OUTPATIENT_CLINIC_OR_DEPARTMENT_OTHER)
Admission: EM | Admit: 2023-12-02 | Discharge: 2023-12-02 | Disposition: A | Discharge status: Home / Self Care | Attending: Emergency Medicine | Admitting: Emergency Medicine

## 2023-12-02 DIAGNOSIS — N939 Abnormal uterine and vaginal bleeding, unspecified: Secondary | ICD-10-CM

## 2023-12-02 DIAGNOSIS — D649 Anemia, unspecified: Secondary | ICD-10-CM

## 2023-12-02 LAB — PREGNANCY, URINE: Preg Test, Ur: NEGATIVE

## 2023-12-02 MED ORDER — ONDANSETRON 4 MG PO TBDP: 4.0000 mg | ORAL_TABLET | Freq: Three times a day (TID) | ORAL | 0 refills | Status: DC | PRN

## 2023-12-02 MED ORDER — MEDROXYPROGESTERONE ACETATE 10 MG PO TABS
10.0000 mg | ORAL_TABLET | Freq: Every day | ORAL | Status: DC
  Filled 2023-12-02: qty 1

## 2023-12-02 MED ORDER — MEDROXYPROGESTERONE ACETATE 10 MG PO TABS: 10.0000 mg | ORAL_TABLET | Freq: Every day | ORAL | 0 refills | Status: DC

## 2023-12-02 MED ORDER — ONDANSETRON 4 MG PO TBDP
8.0000 mg | ORAL_TABLET | Freq: Once | ORAL | Status: AC
  Administered 2023-12-02: 8 mg via ORAL
  Filled 2023-12-02: qty 2

## 2023-12-02 NOTE — ED Provider Notes (Signed)
 [place] [facility] Provider Note   CSN: [id number] Arrival date & time: 12/01/23  2201     History  Chief Complaint  Patient presents with   Vaginal Bleeding    Brittney Watkins is a 43 y.o. female.  The history is provided by the patient.  Vaginal Bleeding She complains of vaginal bleeding which is unusual for her.  Her last normal menses was February 19, and she started having some spotting about 3 weeks ago.  It has been intermittent, but she has had increased amount of bleeding over the last 3 days.  Bleeding has been to the point where she is changing 1 pad every hour.  She denies any clots.  There has been some lower abdominal cramping and she also endorses some nausea and vomiting.  She had been taking acetaminophen, but states it would only give her relief for about 1 hour.  She had seen her gynecologist 3 days ago at which time cultures were obtained.  She states that prior to this, her menses had been very regular.   Home Medications Prior to Admission medications   Medication Sig Start Date End Date Taking? Authorizing Provider  ferrous sulfate 325 (65 FE) MG EC tablet Take 325 mg by mouth 3 (three) times daily with meals.    [provider]  SUMAtriptan (IMITREX) 25 MG tablet At onset of headache, take 1 sumatriptan tablet with ibuprofen 400 mg and 1 tablespoon of peanut butter.  Take 2nd sumatriptan tablet in 1 hour if headache not completely resolved.  Do not exceed sumatriptan 2 tablets (50 mg) in 24 hours. 12/27/21   Theadora Rama Scales, PA-C  dicyclomine (BENTYL) 20 MG tablet Take 1 tablet (20 mg total) by mouth 4 (four) times daily -  before meals and at bedtime. Patient not taking: Reported on 03/27/2019 03/24/19 07/19/19  Wieters, Hallie C, PA-C  rizatriptan (MAXALT-MLT) 5 MG disintegrating tablet DIS 1 T ON THE TONGUE QD Patient taking differently: Take 5 mg by mouth as needed for migraine.  09/17/18 07/19/19  Mardella Layman, MD      Allergies    Nyquil multi-symptom [pseudoeph-doxylamine-dm-apap] and Shellfish allergy    Review of Systems   Review of Systems  Genitourinary:  Positive for vaginal bleeding.  All other systems reviewed and are negative.   Physical Exam Updated Vital Signs BP 113/71 (BP Location: Right Arm)   Pulse 73   Temp (!) 97 F (36.1 C)   Resp 18   Ht 5\' 2"  (1.575 m)   Wt 59.9 kg   LMP 11/07/2023   SpO2 100%   BMI 24.14 kg/m  Physical Exam Vitals and nursing note reviewed. Exam conducted with a chaperone present.   43 year old female, resting comfortably and in no acute distress. Vital signs are normal. Oxygen saturation is 100%, which is normal. Head is normocephalic and atraumatic. PERRLA, EOMI.  Lungs are clear without rales, wheezes, or rhonchi. Chest is nontender. Heart has regular rate and rhythm without murmur. Abdomen is soft, flat, with mild suprapubic tenderness. Pelvic: Normal external female genitalia.  Small amount of blood present in the vaginal vault, cervix is closed.  On bimanual exam, fundus is enlarged to about 6 weeks size but is nontender.  No adnexal masses or tenderness. Extremities have no cyanosis or edema, full range of motion is present. Skin is warm and dry without rash. Neurologic: Mental status is normal, cranial nerves are intact, moves all extremities equally.  ED Results / Procedures / Treatments   Labs (all labs ordered are listed, but only abnormal results are displayed) Labs Reviewed  CBC WITH DIFFERENTIAL/PLATELET - Abnormal; Notable for the following components:      Result Value   Hemoglobin 11.7 (*)    HCT 35.6 (*)    All other components within normal limits  PREGNANCY, URINE   Procedures Procedures    Medications Ordered in ED Medications  medroxyPROGESTERone (PROVERA) tablet 10 mg (has no administration in time range)  ondansetron (ZOFRAN-ODT) disintegrating tablet 8 mg (8 mg Oral Given 12/02/23 0145)    ED  Course/ Medical Decision Making/ A&P                                 Medical Decision Making Amount and/or Complexity of Data Reviewed Labs: ordered.   Abnormal uterine bleeding.  I have reviewed her laboratory tests, and my interpretation is stable anemia, not pregnant.  I have reviewed her past records and do note office visit on 11/28/2023 for abnormal uterine bleeding at which time I have friable area of the cervix was cauterized.  I do not see any bleeding coming from the cervix today.  I have ordered a dose of medroxyprogesterone and I have ordered a dose of ondansetron for her nausea.  I am discharging her with a prescription for a 5-day course of medroxyprogesterone and also prescription for ondansetron oral dissolving tablet.  I am recommending she follow-up with her gynecologist.  Final Clinical Impression(s) / ED Diagnoses Final diagnoses:  Abnormal vaginal bleeding  Normochromic normocytic anemia    Rx / DC Orders ED Discharge Orders          Ordered    medroxyPROGESTERone (PROVERA) 10 MG tablet  Daily,   Status:  Discontinued        12/02/23 0154    ondansetron (ZOFRAN-ODT) 4 MG disintegrating tablet  Every 8 hours PRN,   Status:  Discontinued        12/02/23 0154    medroxyPROGESTERone (PROVERA) 10 MG tablet  Daily        12/02/23 0155    ondansetron (ZOFRAN-ODT) 4 MG disintegrating tablet  Every 8 hours PRN        12/02/23 0155              Dione Booze, MD 12/02/23 0203

## 2023-12-02 NOTE — Discharge Instructions (Addendum)
 You may take ibuprofen (Motrin, Advil) and/or acetaminophen (Tylenol) as needed for pain.  If you combine ibuprofen and acetaminophen, you will get better pain relief than you get from taking either medication by itself.

## 2024-02-25 ENCOUNTER — Ambulatory Visit
Admission: EM | Admit: 2024-02-25 | Discharge: 2024-02-25 | Disposition: A | Discharge status: Home / Self Care | Attending: Family Medicine | Admitting: Family Medicine

## 2024-02-25 ENCOUNTER — Telehealth: Payer: Self-pay | Admitting: Nurse Practitioner

## 2024-02-25 ENCOUNTER — Ambulatory Visit (INDEPENDENT_AMBULATORY_CARE_PROVIDER_SITE_OTHER)

## 2024-02-25 DIAGNOSIS — S93401A Sprain of unspecified ligament of right ankle, initial encounter: Secondary | ICD-10-CM

## 2024-02-25 DIAGNOSIS — M25571 Pain in right ankle and joints of right foot: Secondary | ICD-10-CM

## 2024-02-25 MED ORDER — NAPROXEN 500 MG PO TABS: 500.0000 mg | ORAL_TABLET | Freq: Two times a day (BID) | ORAL | 0 refills | Status: AC | PRN

## 2024-02-25 NOTE — Telephone Encounter (Signed)
 Patient seen earlier today for ankle sprain.  Declined Rx naproxen  at the time but contacted nurses station later this afternoon requesting prescription.  Rx naproxen  sent to pharmacy.

## 2024-02-25 NOTE — Discharge Instructions (Signed)
 Your x-ray was negative for fracture.  Use Ace wrap to help support the joint.  Elevate, ice and take over-the-counter Tylenol  or ibuprofen  as needed.  Please follow-up with your PCP if your symptoms or not improving.  Please go to the ER for any worsening symptoms.  Hope you feel better soon!

## 2024-02-25 NOTE — ED Triage Notes (Signed)
 Patient here today with c/o right ankle pain [place]. Patient states that she was rushing and missed a step and fell down 15 steps.

## 2024-02-25 NOTE — ED Provider Notes (Addendum)
 [facility]    CSN: [id number] Arrival date & time: 02/25/24  0946      History   Chief Complaint Chief Complaint  Patient presents with   Fall    HPI Brittney Watkins is a 43 y.o. female presents for ankle pain.  Patient reports 1 hour prior to arrival she rolled her right ankle while going downstairs.  States she fell down several stairs but denies head injury or LOC.  States she is able to bear weight on the right ankle with pain.  Endorses some swelling bruising or numbness or tingling.  No history of injuries or surgeries to the ankle in the past.  She has not taken any OTC treatments for her symptoms.  No other concerns at this time.   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 Authorizing Provider  ferrous sulfate 325 (65 FE) MG EC tablet Take 325 mg by mouth 3 (three) times daily with meals.    [provider]  SUMAtriptan  (IMITREX ) 25 MG tablet At onset of headache, take 1 sumatriptan  tablet with ibuprofen  400 mg and 1 tablespoon of peanut butter.  Take 2nd sumatriptan  tablet in 1 hour if headache not completely resolved.  Do not exceed sumatriptan  2 tablets (50 mg) in 24 hours. 12/27/21   Eloise Hake Scales, PA-C  dicyclomine  (BENTYL ) 20 MG tablet Take 1 tablet (20 mg total)  by mouth 4 (four) times daily -  before meals and at bedtime. Patient not taking: Reported on 03/27/2019 03/24/19 07/19/19  Wieters, Hallie C, PA-C  rizatriptan  (MAXALT -MLT) 5 MG disintegrating tablet DIS 1 T ON THE TONGUE QD Patient taking differently: Take 5 mg by mouth as needed for migraine.  09/17/18 07/19/19  Afton Albright, MD    Family History Family History  Problem Relation Age of Onset   Heart attack Father    Diabetes Maternal Grandmother        some uncles as well    High blood pressure Maternal Grandmother        some uncles as well   Migraines Neg Hx     Social History Social History   Tobacco Use   Smoking status: Never   Smokeless tobacco: Never  Vaping Use   Vaping status: Never Used  Substance Use Topics   Alcohol use: No    Alcohol/week: 0.0 standard drinks of alcohol   Drug use: No     Allergies   Nyquil multi-symptom [pseudoeph-doxylamine-dm-apap] and Shellfish allergy  Review of Systems Review of Systems  Musculoskeletal:        Right ankle pain     Physical Exam Triage Vital Signs ED Triage Vitals  Encounter Vitals Group     BP 02/25/24 0956 100/68     Systolic BP Percentile --      Diastolic BP Percentile --      Pulse Rate 02/25/24 0956 91     Resp 02/25/24 0956 16     Temp 02/25/24 0956 99 F (37.2 C)     Temp Source 02/25/24 0956 Oral     SpO2 02/25/24 0956 96 %     Weight --      Height --      Head Circumference --      Peak Flow --      Pain Score 02/25/24 0957 8     Pain Loc --      Pain Education --      Exclude from Growth Chart --    No data found.  Updated Vital Signs BP 100/68 (BP Location: Right Arm)   Pulse 91   Temp 99 F (37.2 C) (Oral)   Resp 16   LMP 02/12/2024 (Exact Date)   SpO2 96%   Visual Acuity Right Eye Distance:   Left Eye Distance:   Bilateral Distance:    Right Eye Near:   Left Eye Near:    Bilateral Near:     Physical Exam Vitals and nursing note reviewed.  Constitutional:       General: She is not in acute distress.    Appearance: Normal appearance. She is not ill-appearing.  HENT:     Head: Normocephalic and atraumatic.  Eyes:     Pupils: Pupils are equal, round, and reactive to light.  Cardiovascular:     Rate and Rhythm: Normal rate.  Pulmonary:     Effort: Pulmonary effort is normal.  Musculoskeletal:     Right ankle: No swelling, deformity, ecchymosis or lacerations. Tenderness present over the lateral malleolus. No base of 5th metatarsal or proximal fibula tenderness. Decreased range of motion. Anterior drawer test negative. Normal pulse.     Comments: Pain with dorsi flexion and extension of the ankle.  DP +2.  Skin:    General: Skin is warm and dry.  Neurological:     General: No focal deficit present.     Mental Status: She is alert and oriented to person, place, and time.  Psychiatric:        Mood and Affect: Mood normal.        Behavior: Behavior normal.      UC Treatments / Results  Labs (all labs ordered are listed, but only abnormal results are displayed) Labs Reviewed - No data to display  EKG   Radiology DG Ankle Complete Right Result Date: 02/25/2024 CLINICAL DATA:  rolled ankle one hour ago. EXAM: RIGHT ANKLE - COMPLETE 3+ VIEW COMPARISON:  None Available. FINDINGS: No acute fracture or dislocation. No aggressive osseous lesion. Ankle mortise appears intact. No significant degenerative changes of imaged joints. No focal soft tissue swelling. No radiopaque foreign bodies. IMPRESSION: No acute osseous abnormality of the right ankle. Electronically Signed   By: Beula Brunswick M.D.   On: 02/25/2024 10:25    Procedures Procedures (including critical care time)  Medications Ordered in UC Medications - No data to display  Initial Impression / Assessment and Plan / UC Course  I have reviewed the triage vital signs and the nursing notes.  Pertinent labs & imaging results that were available during my care of the patient were reviewed  by me and considered in my medical decision making (see chart for details).     Reviewed exam and symptoms with patient.  No red flags.  X-ray negative for fracture.  Discussed ankle sprain.  Ace wrap applied in clinic and RICE therapy reviewed.  Patient requested crutches and nursing staff fitted patient for this.  OTC analgesics as needed.  PCP follow-up if symptoms do not improve.  ER precautions reviewed. Final Clinical Impressions(s) / UC Diagnoses   Final diagnoses:  Acute right ankle pain  Sprain of right ankle, unspecified ligament, initial encounter     Discharge Instructions      Your x-ray was negative for fracture.  Use Ace wrap to help support the joint.  Elevate, ice and take over-the-counter Tylenol  or ibuprofen  as needed.  Please follow-up with your PCP if your symptoms or not improving.  Please go to the ER for any worsening symptoms.  Hope you feel better soon!   ED Prescriptions   None    PDMP not reviewed this encounter.   Alleen Arbour, NP 02/25/24 1047    Alleen Arbour, NP 02/25/24 1053

## 2024-03-08 ENCOUNTER — Ambulatory Visit

## 2024-03-08 ENCOUNTER — Ambulatory Visit
Admission: RE | Admit: 2024-03-08 | Discharge: 2024-03-08 | Disposition: A | Discharge status: Home / Self Care | Source: Ambulatory Visit | Attending: Internal Medicine | Admitting: Internal Medicine

## 2024-03-08 DIAGNOSIS — N3000 Acute cystitis without hematuria: Secondary | ICD-10-CM | POA: Diagnosis present

## 2024-03-08 LAB — POCT URINALYSIS DIP (MANUAL ENTRY)
Glucose, UA: 100 mg/dL — AB
Leukocytes, UA: NEGATIVE
Nitrite, UA: POSITIVE — AB
Protein Ur, POC: 100 mg/dL — AB
Spec Grav, UA: 1.025 (ref 1.010–1.025)
Urobilinogen, UA: 1 U/dL
pH, UA: 5 (ref 5.0–8.0)

## 2024-03-08 LAB — POCT URINE PREGNANCY: Preg Test, Ur: NEGATIVE

## 2024-03-08 MED ORDER — NITROFURANTOIN MONOHYD MACRO 100 MG PO CAPS: 100.0000 mg | ORAL_CAPSULE | Freq: Two times a day (BID) | ORAL | 0 refills | Status: AC

## 2024-03-08 NOTE — ED Triage Notes (Signed)
 Patient presents with urinary frequency x 1 week. She has been taking AZO.

## 2024-03-08 NOTE — Discharge Instructions (Addendum)
 Urine pregnancy test done today is negative.  Urinalysis done today is consistent with a tract infection.  We will send this for culture to ensure that the antibiotic that is prescribed is adequate to treat this.  If there is any changes in treatment we will contact you.  We will treat with the following: Macrobid 100 mg twice daily for 5 days. This is an antibiotic.  Drink plenty of water to stay hydrated. Avoid excessive caffeinated products while you are on treatment Return to urgent care or PCP if symptoms worsen or fail to resolve.

## 2024-03-08 NOTE — ED Provider Notes (Signed)
 [facility]    CSN: [id number] Arrival date & time: 03/08/24  1154      History   Chief Complaint Chief Complaint  Patient presents with   Urinary Frequency    Lower back pain with frequent urination - Entered by patient    HPI Brittney Watkins is a 43 y.o. female.   43 year old female who presents to urgent care with complaints of urinary frequency, urinary urgency and lower back pain.  She reports this started about a week ago.  She has been using Azo but it has not helped.  She denies any fevers, chills, abdominal pain, hematuria, nausea, vomiting.  She denies any loss of bowel or bladder.   Urinary Frequency Pertinent negatives include no chest pain, no abdominal pain and no shortness of breath.    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 Authorizing Provider  nitrofurantoin, macrocrystal-monohydrate, (MACROBID) 100 MG capsule Take 1 capsule (100 mg total) by mouth 2 (two) times daily. 03/08/24  Yes Shawnia Vizcarrondo A, PA-C  ferrous sulfate 325 (65 FE) MG EC tablet Take 325 mg by mouth 3 (three) times daily with meals.    [provider]  naproxen  (NAPROSYN ) 500 MG tablet Take 1 tablet (500 mg total) by mouth 2 (two) times daily as needed. 02/25/24   Mayer, Jodi R, NP  SUMAtriptan   (IMITREX ) 25 MG tablet At onset of headache, take 1 sumatriptan  tablet with ibuprofen  400 mg and 1 tablespoon of peanut butter.  Take 2nd sumatriptan  tablet in 1 hour if headache not completely resolved.  Do not exceed sumatriptan  2 tablets (50 mg) in 24 hours. 12/27/21   Joesph Shaver Scales, PA-C  dicyclomine  (BENTYL ) 20 MG tablet Take 1 tablet (20 mg total) by mouth 4 (four) times daily -  before meals and at bedtime. Patient not taking: Reported on 03/27/2019 03/24/19 07/19/19  Wieters, Hallie C, PA-C  rizatriptan  (MAXALT -MLT) 5 MG disintegrating tablet DIS 1 T ON THE TONGUE QD Patient taking differently: Take 5 mg by mouth as needed for migraine.  09/17/18 07/19/19  Rolinda Rogue, MD    Family History Family History  Problem Relation Age of Onset   Heart attack Father    Diabetes Maternal Grandmother        some uncles as well    High blood pressure Maternal Grandmother        some uncles as well   Migraines Neg Hx     Social History Social History   Tobacco Use  Smoking status: Never   Smokeless tobacco: Never  Vaping Use   Vaping status: Never Used  Substance Use Topics   Alcohol use: No    Alcohol/week: 0.0 standard drinks of alcohol   Drug use: No     Allergies   Nyquil multi-symptom [pseudoeph-doxylamine-dm-apap] and Shellfish allergy   Review of Systems Review of Systems  Constitutional:  Negative for chills and fever.  HENT:  Negative for ear pain and sore throat.   Eyes:  Negative for pain and visual disturbance.  Respiratory:  Negative for cough and shortness of breath.   Cardiovascular:  Negative for chest pain and palpitations.  Gastrointestinal:  Negative for abdominal pain and vomiting.  Genitourinary:  Positive for frequency and urgency. Negative for dysuria, hematuria and pelvic pain.  Musculoskeletal:  Positive for back pain (LOWER). Negative for arthralgias.  Skin:  Negative for color change and rash.  Neurological:  Negative for seizures and  syncope.  All other systems reviewed and are negative.    Physical Exam Triage Vital Signs ED Triage Vitals [03/08/24 1207]  Encounter Vitals Group     BP 117/79     Girls Systolic BP Percentile      Girls Diastolic BP Percentile      Boys Systolic BP Percentile      Boys Diastolic BP Percentile      Pulse Rate 86     Resp 16     Temp 98.9 F (37.2 C)     Temp Source Oral     SpO2 97 %     Weight      Height      Head Circumference      Peak Flow      Pain Score      Pain Loc      Pain Education      Exclude from Growth Chart    No data found.  Updated Vital Signs BP 117/79 (BP Location: Left Arm)   Pulse 86   Temp 98.9 F (37.2 C) (Oral)   Resp 16   LMP 02/12/2024 (Exact Date)   SpO2 97%   Visual Acuity Right Eye Distance:   Left Eye Distance:   Bilateral Distance:    Right Eye Near:   Left Eye Near:    Bilateral Near:     Physical Exam Vitals and nursing note reviewed.  Constitutional:      General: She is not in acute distress.    Appearance: She is well-developed.  HENT:     Head: Normocephalic and atraumatic.   Eyes:     Conjunctiva/sclera: Conjunctivae normal.    Cardiovascular:     Rate and Rhythm: Normal rate and regular rhythm.     Heart sounds: No murmur heard. Pulmonary:     Effort: Pulmonary effort is normal. No respiratory distress.     Breath sounds: Normal breath sounds.  Abdominal:     Palpations: Abdomen is soft.     Tenderness: There is no abdominal tenderness. There is no right CVA tenderness or left CVA tenderness.   Musculoskeletal:        General: No swelling.     Cervical back: Neck supple.       Back:   Skin:    General: Skin is warm and dry.     Capillary Refill: Capillary refill takes less than 2 seconds.   Neurological:     Mental Status: She is alert.   Psychiatric:        Mood and  Affect: Mood normal.      UC Treatments / Results  Labs (all labs ordered are listed, but only abnormal results are  displayed) Labs Reviewed  POCT URINALYSIS DIP (MANUAL ENTRY) - Abnormal; Notable for the following components:      Result Value   Color, UA orange (*)    Glucose, UA =100 (*)    Bilirubin, UA small (*)    Ketones, POC UA small (15) (*)    Blood, UA trace-intact (*)    Protein Ur, POC =100 (*)    Nitrite, UA Positive (*)    All other components within normal limits  POCT URINE PREGNANCY    EKG   Radiology No results found.  Procedures Procedures (including critical care time)  Medications Ordered in UC Medications - No data to display  Initial Impression / Assessment and Plan / UC Course  I have reviewed the triage vital signs and the nursing notes.  Pertinent labs & imaging results that were available during my care of the patient were reviewed by me and considered in my medical decision making (see chart for details).     Acute cystitis without hematuria   Urine pregnancy test done today is negative.  Urinalysis done today is consistent with a tract infection.  We will send this for culture to ensure that the antibiotic that is prescribed is adequate to treat this.  If there is any changes in treatment we will contact you.  We will treat with the following: Macrobid 100 mg twice daily for 5 days. This is an antibiotic.  Drink plenty of water to stay hydrated. Avoid excessive caffeinated products while you are on treatment Return to urgent care or PCP if symptoms worsen or fail to resolve.    Final Clinical Impressions(s) / UC Diagnoses   Final diagnoses:  Acute cystitis without hematuria     Discharge Instructions      Urine pregnancy test done today is negative.  Urinalysis done today is consistent with a tract infection.  We will send this for culture to ensure that the antibiotic that is prescribed is adequate to treat this.  If there is any changes in treatment we will contact you.  We will treat with the following: Macrobid 100 mg twice daily for 5 days.  This is an antibiotic.  Drink plenty of water to stay hydrated. Avoid excessive caffeinated products while you are on treatment Return to urgent care or PCP if symptoms worsen or fail to resolve.       ED Prescriptions     Medication Sig Dispense Auth. Provider   nitrofurantoin, macrocrystal-monohydrate, (MACROBID) 100 MG capsule Take 1 capsule (100 mg total) by mouth 2 (two) times daily. 10 capsule Teresa Almarie LABOR, [place]      PDMP not reviewed this encounter.   Teresa Almarie LABOR, PA-C 03/08/24 1224

## 2024-03-09 LAB — URINE CULTURE
Culture: NO GROWTH
Special Requests: NORMAL

## 2024-03-10 ENCOUNTER — Ambulatory Visit (HOSPITAL_COMMUNITY): Payer: Self-pay
